# Patient Record
Sex: Male | Born: 1960 | Race: Black or African American | Hispanic: No | Marital: Single | Smoking: Never smoker
Health system: Southern US, Community
[De-identification: ages and names within clinical notes are randomized; demographics above are authoritative.]

## PROBLEM LIST (undated history)

## (undated) DIAGNOSIS — E08319 Diabetes mellitus due to underlying condition with unspecified diabetic retinopathy without macular edema: Secondary | ICD-10-CM

## (undated) DIAGNOSIS — D649 Anemia, unspecified: Secondary | ICD-10-CM

## (undated) DIAGNOSIS — R200 Anesthesia of skin: Secondary | ICD-10-CM

## (undated) DIAGNOSIS — I639 Cerebral infarction, unspecified: Secondary | ICD-10-CM

## (undated) DIAGNOSIS — I10 Essential (primary) hypertension: Secondary | ICD-10-CM

## (undated) DIAGNOSIS — I152 Hypertension secondary to endocrine disorders: Secondary | ICD-10-CM

## (undated) DIAGNOSIS — E785 Hyperlipidemia, unspecified: Secondary | ICD-10-CM

## (undated) DIAGNOSIS — G629 Polyneuropathy, unspecified: Secondary | ICD-10-CM

## (undated) DIAGNOSIS — H3500 Unspecified background retinopathy: Secondary | ICD-10-CM

## (undated) DIAGNOSIS — E1159 Type 2 diabetes mellitus with other circulatory complications: Secondary | ICD-10-CM

## (undated) DIAGNOSIS — N189 Chronic kidney disease, unspecified: Secondary | ICD-10-CM

## (undated) DIAGNOSIS — I739 Peripheral vascular disease, unspecified: Secondary | ICD-10-CM

## (undated) DIAGNOSIS — E119 Type 2 diabetes mellitus without complications: Secondary | ICD-10-CM

## (undated) HISTORY — DX: Essential (primary) hypertension: I10

## (undated) HISTORY — DX: Type 2 diabetes mellitus with other circulatory complications: E11.59

## (undated) HISTORY — DX: Anemia, unspecified: D64.9

## (undated) HISTORY — DX: Hypertension secondary to endocrine disorders: I15.2

## (undated) HISTORY — DX: Type 2 diabetes mellitus without complications: E11.9

## (undated) HISTORY — DX: Diabetes mellitus due to underlying condition with unspecified diabetic retinopathy without macular edema: E08.319

## (undated) HISTORY — DX: Polyneuropathy, unspecified: G62.9

## (undated) HISTORY — DX: Anesthesia of skin: R20.0

## (undated) HISTORY — DX: Hyperlipidemia, unspecified: E78.5

## (undated) HISTORY — DX: Peripheral vascular disease, unspecified: I73.9

## (undated) HISTORY — DX: Chronic kidney disease, unspecified: N18.9

---

## 2009-05-28 ENCOUNTER — Emergency Department (HOSPITAL_BASED_OUTPATIENT_CLINIC_OR_DEPARTMENT_OTHER): Admission: EM | Admit: 2009-05-28 | Discharge: 2009-05-28 | Payer: Self-pay | Admitting: Emergency Medicine

## 2009-05-28 ENCOUNTER — Ambulatory Visit: Payer: Self-pay | Admitting: Diagnostic Radiology

## 2009-06-08 ENCOUNTER — Emergency Department (HOSPITAL_BASED_OUTPATIENT_CLINIC_OR_DEPARTMENT_OTHER): Admission: EM | Admit: 2009-06-08 | Discharge: 2009-06-09 | Payer: Self-pay | Admitting: Emergency Medicine

## 2009-06-10 ENCOUNTER — Emergency Department (HOSPITAL_BASED_OUTPATIENT_CLINIC_OR_DEPARTMENT_OTHER): Admission: EM | Admit: 2009-06-10 | Discharge: 2009-06-10 | Payer: Self-pay | Admitting: Emergency Medicine

## 2010-08-12 LAB — DIFFERENTIAL
Eosinophils Absolute: 0.1 10*3/uL (ref 0.0–0.7)
Lymphs Abs: 1.6 10*3/uL (ref 0.7–4.0)
Neutrophils Relative %: 53 % (ref 43–77)

## 2010-08-12 LAB — COMPREHENSIVE METABOLIC PANEL
ALT: 21 U/L (ref 0–53)
Albumin: 4.4 g/dL (ref 3.5–5.2)
Alkaline Phosphatase: 108 U/L (ref 39–117)
CO2: 21 mEq/L (ref 19–32)
Calcium: 9.7 mg/dL (ref 8.4–10.5)
GFR calc Af Amer: >60 mL/min (ref >60)
GFR calc non Af Amer: >60 mL/min (ref >60)
Glucose, Bld: 568 mg/dL (ref 70–99)
Sodium: 135 mEq/L (ref 135–145)

## 2010-08-12 LAB — POCT CARDIAC MARKERS: CKMB, poc: 1.3 ng/mL (ref 1.0–8.0)

## 2010-08-12 LAB — URINALYSIS, ROUTINE W REFLEX MICROSCOPIC
Glucose, UA: >1000 mg/dL — AB
Hgb urine dipstick: NEGATIVE
Ketones, ur: 15 mg/dL — AB
Leukocytes, UA: NEGATIVE
Protein, ur: NEGATIVE mg/dL
Urobilinogen, UA: 0.2 mg/dL (ref 0.0–1.0)
pH: 6 (ref 5.0–8.0)

## 2010-08-12 LAB — POCT I-STAT 3, VENOUS BLOOD GAS (G3P V)
Acid-base deficit: 2 mmol/L (ref 0.0–2.0)
Bicarbonate: 22.7 mEq/L (ref 20.0–24.0)
O2 Saturation: 94 %
TCO2: 24 mmol/L (ref 0–100)
pCO2, Ven: 39.5 mmHg — ABNORMAL LOW (ref 45.0–50.0)
pO2, Ven: 74 mmHg — ABNORMAL HIGH (ref 30.0–45.0)

## 2010-08-12 LAB — GLUCOSE, CAPILLARY
Glucose-Capillary: 335 mg/dL — ABNORMAL HIGH (ref 70–99)
Glucose-Capillary: 530 mg/dL (ref 70–99)

## 2010-08-12 LAB — CBC
Platelets: 127 10*3/uL — ABNORMAL LOW (ref 150–400)
RBC: 5.68 MIL/uL (ref 4.22–5.81)

## 2010-08-12 LAB — KETONES, QUALITATIVE: Acetone, Bld: POSITIVE — AB

## 2010-08-12 LAB — URINE MICROSCOPIC-ADD ON

## 2013-07-27 ENCOUNTER — Ambulatory Visit: Payer: Self-pay | Admitting: Podiatry

## 2013-08-17 ENCOUNTER — Ambulatory Visit: Payer: Self-pay | Admitting: Podiatry

## 2013-08-31 ENCOUNTER — Ambulatory Visit (INDEPENDENT_AMBULATORY_CARE_PROVIDER_SITE_OTHER): Payer: BC Managed Care – PPO

## 2013-08-31 ENCOUNTER — Ambulatory Visit (INDEPENDENT_AMBULATORY_CARE_PROVIDER_SITE_OTHER): Payer: BC Managed Care – PPO | Admitting: Podiatry

## 2013-08-31 ENCOUNTER — Encounter: Payer: Self-pay | Admitting: Podiatry

## 2013-08-31 VITALS — BP 120/82 | HR 90 | Resp 16 | Ht 68.5 in | Wt 179.0 lb

## 2013-08-31 DIAGNOSIS — M722 Plantar fascial fibromatosis: Secondary | ICD-10-CM

## 2013-08-31 DIAGNOSIS — Q6650 Congenital pes planus, unspecified foot: Secondary | ICD-10-CM

## 2013-08-31 DIAGNOSIS — M79671 Pain in right foot: Secondary | ICD-10-CM

## 2013-08-31 DIAGNOSIS — M79672 Pain in left foot: Principal | ICD-10-CM

## 2013-08-31 DIAGNOSIS — M79609 Pain in unspecified limb: Secondary | ICD-10-CM

## 2013-08-31 DIAGNOSIS — E1049 Type 1 diabetes mellitus with other diabetic neurological complication: Secondary | ICD-10-CM

## 2013-08-31 MED ORDER — GABAPENTIN 300 MG PO CAPS
ORAL_CAPSULE | ORAL | Status: DC
Start: 2013-08-31 — End: 2014-12-23

## 2013-08-31 NOTE — Progress Notes (Signed)
   Subjective:    Patient ID: Douglas Briggs, male    DOB: 03/10/1961, 53 y.o.   MRN: [id number]  HPI Comments: Been having trouble with both my feet at night , they burn and hurt real badly. i am diabetic for the last 3 years. Blood sugar this morning was 130   Foot Pain  Diabetes      Review of Systems  Endocrine:       Diabetes   All other systems reviewed and are negative.       Objective:   Physical Exam: I have reviewed his past medical history medications allergies surgeries social history and review of systems. Pulses are strongly palpable but neurologic sensorium is decreased per since once the monofilament. Deep tendon reflexes are not elicitable. Muscle strength is 5 over 5 dorsiflexors plantar flexors inverters everters all intrinsic musculature is intact. Orthopedic evaluation demonstrates all joints distal to the ankle a full range of motion without crepitus. Flexible pes planus is also noted. Mild plantar fasciitis. Radiographic evaluation demonstrates flexible pes planus no other major osseous abnormalities.        Assessment & Plan:  Assessment: Diabetic peripheral neuropathy and pes planus. Mild plantar fasciitis.  Plan: Discussed etiology pathology conservative versus surgical therapies. At this point we are going to increase his gabapentin 300 mg in the morning. 300 mg at lunch. And 600 mg at night. He was scanned for a pair orthotics today and I will followup with him in one month to reevaluate his medication.

## 2013-09-14 ENCOUNTER — Telehealth: Payer: Self-pay | Admitting: *Deleted

## 2013-09-14 ENCOUNTER — Ambulatory Visit (INDEPENDENT_AMBULATORY_CARE_PROVIDER_SITE_OTHER): Payer: BC Managed Care – PPO | Admitting: Podiatry

## 2013-09-14 ENCOUNTER — Encounter: Payer: Self-pay | Admitting: Podiatry

## 2013-09-14 VITALS — BP 120/80 | HR 80 | Resp 16

## 2013-09-14 DIAGNOSIS — M722 Plantar fascial fibromatosis: Secondary | ICD-10-CM

## 2013-09-14 NOTE — Progress Notes (Signed)
Pt presents for orthotic pick up written and verbal instructions given . He also states he has a problem with an ingrown nail we will followup with him in 3-4 weeks for reevaluation I 1 the ingrown nail and up to the orthotics.

## 2013-09-14 NOTE — Patient Instructions (Signed)

## 2013-09-14 NOTE — Telephone Encounter (Signed)
I was told to give you all a call.  I returned his call.  He said that he's headed this way now.  I asked if he was calling about his orthotics.  He stated yes.  I asked if he was told to schedule an appointment.  He said he has an appointment at 4pm to see the nurse and has a follow-up appointment with Dr. Milinda Pointer on 09/28/13.

## 2013-09-28 ENCOUNTER — Ambulatory Visit (INDEPENDENT_AMBULATORY_CARE_PROVIDER_SITE_OTHER): Payer: BC Managed Care – PPO | Admitting: Podiatry

## 2013-09-28 ENCOUNTER — Encounter: Payer: Self-pay | Admitting: Podiatry

## 2013-09-28 VITALS — BP 166/96 | HR 88 | Resp 17 | Ht 68.5 in | Wt 178.0 lb

## 2013-09-28 DIAGNOSIS — M722 Plantar fascial fibromatosis: Secondary | ICD-10-CM

## 2013-09-28 DIAGNOSIS — M79609 Pain in unspecified limb: Secondary | ICD-10-CM

## 2013-09-28 DIAGNOSIS — E1049 Type 1 diabetes mellitus with other diabetic neurological complication: Secondary | ICD-10-CM

## 2013-09-28 DIAGNOSIS — B351 Tinea unguium: Secondary | ICD-10-CM

## 2013-09-28 DIAGNOSIS — Q6650 Congenital pes planus, unspecified foot: Secondary | ICD-10-CM

## 2013-09-28 NOTE — Progress Notes (Signed)
   Subjective:    Patient ID: Douglas Briggs, male    DOB: 1961-04-18, 53 y.o.   MRN: [id number]  HPI Comments: Pt states his toes feel tight and his feet hurt off and on.  Pt complains of pain in the left 5th lateral toe for 2 months.     Review of Systems     Objective:   Physical Exam: I have reviewed his past history medications and allergies. He has much decrease in symptoms of flatfoot deformity with the use of his orthotics at this point. However his nails are thick yellow dystrophic onychomycotic and painful palpation.          Assessment & Plan:  Assessment: Pes planus with plantar fasciitis resolving. Pain in limb secondary to onychomycosis a sharp incurvated nail margins with 5 bilateral.  Plan: Debridement of nails 1 through 5 bilateral covered service secondary to pain continue the use of the orthotics.

## 2014-12-23 ENCOUNTER — Other Ambulatory Visit: Payer: Self-pay | Admitting: Podiatry

## 2015-12-18 ENCOUNTER — Other Ambulatory Visit: Payer: Self-pay | Admitting: Podiatry

## 2015-12-19 NOTE — Telephone Encounter (Signed)
Pt needs an appt prior to future refills. 

## 2016-01-15 ENCOUNTER — Other Ambulatory Visit: Payer: Self-pay | Admitting: Podiatry

## 2016-01-15 NOTE — Telephone Encounter (Signed)
Pt needs an appt prior to future refills. 

## 2016-11-12 DIAGNOSIS — IMO0002 Reserved for concepts with insufficient information to code with codable children: Secondary | ICD-10-CM

## 2016-11-12 DIAGNOSIS — E1165 Type 2 diabetes mellitus with hyperglycemia: Secondary | ICD-10-CM

## 2016-11-12 HISTORY — DX: Reserved for concepts with insufficient information to code with codable children: IMO0002

## 2016-11-12 HISTORY — DX: Type 2 diabetes mellitus with diabetic polyneuropathy: E11.65

## 2016-11-14 DIAGNOSIS — E291 Testicular hypofunction: Secondary | ICD-10-CM

## 2016-11-14 DIAGNOSIS — G2581 Restless legs syndrome: Secondary | ICD-10-CM

## 2016-11-14 HISTORY — DX: Restless legs syndrome: G25.81

## 2016-11-14 HISTORY — DX: Testicular hypofunction: E29.1

## 2017-03-12 DIAGNOSIS — M2042 Other hammer toe(s) (acquired), left foot: Secondary | ICD-10-CM

## 2017-03-12 DIAGNOSIS — B353 Tinea pedis: Secondary | ICD-10-CM | POA: Insufficient documentation

## 2017-03-12 HISTORY — DX: Other hammer toe(s) (acquired), left foot: M20.42

## 2017-03-12 HISTORY — DX: Tinea pedis: B35.3

## 2017-08-29 DIAGNOSIS — Z794 Long term (current) use of insulin: Secondary | ICD-10-CM | POA: Insufficient documentation

## 2017-08-29 DIAGNOSIS — E1140 Type 2 diabetes mellitus with diabetic neuropathy, unspecified: Secondary | ICD-10-CM | POA: Insufficient documentation

## 2017-08-29 DIAGNOSIS — L089 Local infection of the skin and subcutaneous tissue, unspecified: Secondary | ICD-10-CM | POA: Diagnosis not present

## 2017-08-29 DIAGNOSIS — R21 Rash and other nonspecific skin eruption: Secondary | ICD-10-CM | POA: Diagnosis present

## 2017-08-29 DIAGNOSIS — Z23 Encounter for immunization: Secondary | ICD-10-CM | POA: Diagnosis not present

## 2017-08-30 ENCOUNTER — Emergency Department (HOSPITAL_BASED_OUTPATIENT_CLINIC_OR_DEPARTMENT_OTHER)
Admission: EM | Admit: 2017-08-30 | Discharge: 2017-08-30 | Disposition: A | Discharge status: Home / Self Care | Payer: PRIVATE HEALTH INSURANCE | Attending: Emergency Medicine | Admitting: Emergency Medicine

## 2017-08-30 ENCOUNTER — Other Ambulatory Visit: Payer: Self-pay

## 2017-08-30 ENCOUNTER — Encounter (HOSPITAL_BASED_OUTPATIENT_CLINIC_OR_DEPARTMENT_OTHER): Payer: Self-pay | Admitting: Emergency Medicine

## 2017-08-30 DIAGNOSIS — L089 Local infection of the skin and subcutaneous tissue, unspecified: Secondary | ICD-10-CM

## 2017-08-30 MED ORDER — IBUPROFEN 800 MG PO TABS: 800.0000 mg | ORAL_TABLET | Freq: Three times a day (TID) | ORAL | 0 refills | Status: DC

## 2017-08-30 MED ORDER — IBUPROFEN 800 MG PO TABS
800.0000 mg | ORAL_TABLET | Freq: Once | ORAL | Status: AC
Start: 2017-08-30 — End: 2017-08-30
  Administered 2017-08-30: 800 mg via ORAL
  Filled 2017-08-30: qty 1

## 2017-08-30 MED ORDER — DOXYCYCLINE HYCLATE 100 MG PO CAPS: 100.0000 mg | ORAL_CAPSULE | Freq: Two times a day (BID) | ORAL | 0 refills | Status: DC

## 2017-08-30 MED ORDER — DOXYCYCLINE HYCLATE 100 MG PO TABS
100.0000 mg | ORAL_TABLET | Freq: Once | ORAL | Status: AC
  Administered 2017-08-30: 100 mg via ORAL
  Filled 2017-08-30: qty 1

## 2017-08-30 MED ORDER — BACITRACIN ZINC 500 UNIT/GM EX OINT
TOPICAL_OINTMENT | Freq: Two times a day (BID) | CUTANEOUS | Status: DC
  Administered 2017-08-30: 03:00:00 via TOPICAL
  Filled 2017-08-30: qty 28.35

## 2017-08-30 MED ORDER — TETANUS-DIPHTH-ACELL PERTUSSIS 5-2.5-18.5 LF-MCG/0.5 IM SUSP
0.5000 mL | Freq: Once | INTRAMUSCULAR | Status: AC
  Administered 2017-08-30: 0.5 mL via INTRAMUSCULAR
  Filled 2017-08-30: qty 0.5

## 2017-08-30 NOTE — ED Triage Notes (Signed)
Patient states that he has had "sore" to the back of his head x 2 days - noted abscess to the back of his head  - also reports that he has a rash to his groin x 2 days

## 2017-08-30 NOTE — ED Provider Notes (Signed)
[facility] EMERGENCY DEPARTMENT Provider Note   CSN: [id number] Arrival date & time: 08/29/17  2352     History   Chief Complaint Chief Complaint  Patient presents with  . Abscess  . Rash    HPI Douglas Briggs is a 57 y.o. male.  The history is provided by the patient.  Abscess  Location:  Head/neck Head/neck abscess location:  Scalp Abscess quality: draining, itching and painful   Abscess quality: no redness   Red streaking: no   Duration:  1 week Progression:  Unchanged Pain details:    Quality:  Dull   Severity:  Moderate   Duration:  1 week   Timing:  Constant   Progression:  Unchanged Chronicity:  New Relieved by:  Nothing Worsened by:  Nothing Ineffective treatments:  None tried Associated symptoms: no anorexia, no fatigue, no fever, no headaches, no nausea and no vomiting   Risk factors: no prior abscess   elevated lesion to the occiput since getting head shaved a week ago.  No f/c/r.  No neck stiffness, no redness or streaking no LAN.    Past Medical History:  Diagnosis Date  . Diabetes mellitus without complication ([place])   . Neuropathy     There are no active problems to display for this patient.   History reviewed. No pertinent surgical history.      Home Medications    Prior to Admission medications   Medication Sig Start Date End Date Taking? Authorizing Provider  doxycycline (VIBRAMYCIN) 100 MG capsule Take 1 capsule (100 mg total) by mouth 2 (two) times daily. One po bid x 7 days 08/30/17   Neng Albee, MD  gabapentin (NEURONTIN) 300 MG capsule Take 300 mg by mouth at bedtime.    [provider]  gabapentin (NEURONTIN) 300 MG capsule TAKE 1 CAPSULES IN THE MORNING AND TAKE 1 CAPSULE AT [facility] 2 CAPSULE AT BEDTIME 01/15/16   Hyatt, Max T, DPM  ibuprofen (ADVIL,MOTRIN) 800 MG tablet Take 1 tablet (800 mg total) by mouth 3 (three) times daily. 08/30/17   Arbor Leer, MD  Insulin Glargine (LANTUS) 100 UNIT/ML Solostar Pen  Inject into the skin 2 (two) times daily.    [provider]  metFORMIN (GLUCOPHAGE) 1000 MG tablet Take 1,000 mg by mouth 2 (two) times daily with a meal.    [provider]    Family History History reviewed. No pertinent family history.  Social History Social History   Tobacco Use  . Smoking status: Never Smoker  . Smokeless tobacco: Never Used  Substance Use Topics  . Alcohol use: No  . Drug use: No     Allergies   Patient has no known allergies.   Review of Systems Review of Systems  Constitutional: Negative for fatigue and fever.  Gastrointestinal: Negative for anorexia, nausea and vomiting.  Musculoskeletal: Negative for neck pain and neck stiffness.  Skin: Negative for rash.  Neurological: Negative for headaches.  Hematological: Negative for adenopathy. Does not bruise/bleed easily.  All other systems reviewed and are negative.    Physical Exam Updated Vital Signs BP (!) 169/101 (BP Location: Right Arm)   Pulse 97   Temp 98.4 F (36.9 C) (Oral)   Resp 18   Ht 5' 6.5" (1.689 m)   Wt 79.8 kg (176 lb)   SpO2 99%   BMI 27.98 kg/m   Physical Exam  Constitutional: He is oriented to person, place, and time. He appears well-developed and well-nourished. No distress.  HENT:  Head: Normocephalic and atraumatic.  Mouth/Throat: No oropharyngeal exudate.  Eyes: Pupils are equal, round, and reactive to light. Conjunctivae are normal.  Neck: Normal range of motion. Neck supple.  Cardiovascular: Normal rate, regular rhythm, normal heart sounds and intact distal pulses.  Pulmonary/Chest: Effort normal and breath sounds normal. No stridor. He has no wheezes. He has no rales.  Abdominal: Soft. Bowel sounds are normal. He exhibits no mass. There is no tenderness. There is no rebound and no guarding.  Musculoskeletal: Normal range of motion.  Neurological: He is alert and oriented to person, place, and time. He displays normal reflexes.  Skin: Skin is  warm and dry. Capillary refill takes less than 2 seconds.        ED Treatments / Results  Labs (all labs ordered are listed, but only abnormal results are displayed) Labs Reviewed - No data to display  EKG None  Radiology No results found.  Procedures Procedures (including critical care time)  Medications Ordered in ED Medications  bacitracin ointment ( Topical Given 08/30/17 0303)  doxycycline (VIBRA-TABS) tablet 100 mg (100 mg Oral Given 08/30/17 0253)  ibuprofen (ADVIL,MOTRIN) tablet 800 mg (800 mg Oral Given 08/30/17 0255)  Tdap (BOOSTRIX) injection 0.5 mL (0.5 mLs Intramuscular Given 08/30/17 0254)       Final Clinical Impressions(s) / ED Diagnoses   Final diagnoses:  Skin infection   Will treat for skin infection.  No head shaving.  Wound care instructions given.     Return for weakness, numbness, changes in vision or speech, fevers >100.4 unrelieved by medication, shortness of breath, intractable vomiting, or diarrhea, abdominal pain, Inability to tolerate liquids or food, cough, altered mental status or any concerns. No signs of systemic illness or infection. The patient is nontoxic-appearing on exam and vital signs are within normal limits.   I have reviewed the triage vital signs and the nursing notes. Pertinent labs &imaging results that were available during my care of the patient were reviewed by me and considered in my medical decision making (see chart for details).  After history, exam, and medical workup I feel the patient has been appropriately medically screened and is safe for discharge home. Pertinent diagnoses were discussed with the patient. Patient was given return precautions.   ED Discharge Orders        Ordered    doxycycline (VIBRAMYCIN) 100 MG capsule  2 times daily     08/30/17 0240    ibuprofen (ADVIL,MOTRIN) 800 MG tablet  3 times daily     08/30/17 0240       Liala Codispoti, MD 08/30/17 [phone]

## 2017-12-04 ENCOUNTER — Emergency Department (HOSPITAL_BASED_OUTPATIENT_CLINIC_OR_DEPARTMENT_OTHER)
Admission: EM | Admit: 2017-12-04 | Discharge: 2017-12-04 | Disposition: A | Discharge status: Home / Self Care | Payer: Managed Care, Other (non HMO) | Attending: Emergency Medicine | Admitting: Emergency Medicine

## 2017-12-04 ENCOUNTER — Encounter (HOSPITAL_BASED_OUTPATIENT_CLINIC_OR_DEPARTMENT_OTHER): Payer: Self-pay

## 2017-12-04 DIAGNOSIS — E1140 Type 2 diabetes mellitus with diabetic neuropathy, unspecified: Secondary | ICD-10-CM | POA: Insufficient documentation

## 2017-12-04 DIAGNOSIS — Z79899 Other long term (current) drug therapy: Secondary | ICD-10-CM | POA: Diagnosis not present

## 2017-12-04 DIAGNOSIS — R21 Rash and other nonspecific skin eruption: Secondary | ICD-10-CM

## 2017-12-04 DIAGNOSIS — L0231 Cutaneous abscess of buttock: Secondary | ICD-10-CM | POA: Insufficient documentation

## 2017-12-04 DIAGNOSIS — Z794 Long term (current) use of insulin: Secondary | ICD-10-CM | POA: Insufficient documentation

## 2017-12-04 LAB — CBG MONITORING, ED: GLUCOSE-CAPILLARY: 268 mg/dL — AB (ref 70–99)

## 2017-12-04 MED ORDER — CEPHALEXIN 500 MG PO CAPS: 500.0000 mg | ORAL_CAPSULE | Freq: Three times a day (TID) | ORAL | 0 refills | Status: DC

## 2017-12-04 MED ORDER — TRIAMCINOLONE ACETONIDE 0.1 % EX CREA: 1.0000 "application " | TOPICAL_CREAM | Freq: Two times a day (BID) | CUTANEOUS | 0 refills | Status: DC

## 2017-12-04 MED ORDER — HYDROXYZINE HCL 25 MG PO TABS: 25.0000 mg | ORAL_TABLET | Freq: Four times a day (QID) | ORAL | 0 refills | Status: DC

## 2017-12-04 MED FILL — CEPHALEXIN 500 MG CAPSULE: 500 | 7 days supply | Qty: 21 | Fill #0

## 2017-12-04 MED FILL — TRIAMCINOLONE 0.1% CREAM: 0.1 | 15 days supply | Qty: 30 | Fill #0

## 2017-12-04 MED FILL — hydrOXYzine HCL 25 MG TABS: 25 | 3 days supply | Qty: 12 | Fill #0

## 2017-12-04 NOTE — ED Provider Notes (Signed)
[facility] EMERGENCY DEPARTMENT Provider Note   CSN: [id number] Arrival date & time: 12/04/17  1030     History   Chief Complaint Chief Complaint  Patient presents with  . Abscess    HPI Douglas Briggs is a 57 y.o. male.  HPI  Douglas Briggs is a 57 y.o. male presents to ED with rash to the lower abdomen and lower back. States rash is dry, daily, itchy.  Started several days ago.  States tried putting baby oil on it, it did not help. States last night reports noticing pain to left buttock. States since then increased pain, redness, swelling, draining from a bump on left buttock.  No other complaints.  No fever or chills.  No URI symptoms.  No history of similar rash in the past.   Past Medical History:  Diagnosis Date  . Diabetes mellitus without complication ([place])   . Neuropathy     There are no active problems to display for this patient.   History reviewed. No pertinent surgical history.      Home Medications    Prior to Admission medications   Medication Sig Start Date End Date Taking? Authorizing Provider  gabapentin (NEURONTIN) 300 MG capsule Take 300 mg by mouth at bedtime.    [provider]  gabapentin (NEURONTIN) 300 MG capsule TAKE 1 CAPSULES IN THE MORNING AND TAKE 1 CAPSULE AT [facility] 2 CAPSULE AT BEDTIME 01/15/16   Hyatt, Max T, DPM  ibuprofen (ADVIL,MOTRIN) 800 MG tablet Take 1 tablet (800 mg total) by mouth 3 (three) times daily. 08/30/17   Palumbo, April, MD  Insulin Glargine (LANTUS) 100 UNIT/ML Solostar Pen Inject into the skin 2 (two) times daily.    [provider]  metFORMIN (GLUCOPHAGE) 1000 MG tablet Take 1,000 mg by mouth 2 (two) times daily with a meal.    [provider]    Family History No family history on file.  Social History Social History   Tobacco Use  . Smoking status: Never Smoker  . Smokeless tobacco: Never Used  Substance Use Topics  . Alcohol use: No  . Drug use: No     Allergies     Patient has no known allergies.   Review of Systems Review of Systems  Constitutional: Negative for chills and fever.  Respiratory: Negative for cough, chest tightness and shortness of breath.   Cardiovascular: Negative for chest pain, palpitations and leg swelling.  Gastrointestinal: Negative for abdominal distention, abdominal pain, diarrhea, nausea and vomiting.  Genitourinary: Negative for dysuria, frequency, hematuria and urgency.  Musculoskeletal: Negative for arthralgias, myalgias, neck pain and neck stiffness.  Skin: Positive for rash and wound.  Allergic/Immunologic: Negative for immunocompromised state.  Neurological: Negative for dizziness, weakness, light-headedness, numbness and headaches.  All other systems reviewed and are negative.    Physical Exam Updated Vital Signs BP 133/81 (BP Location: Right Arm)   Pulse 71   Temp 98.2 F (36.8 C) (Oral)   Resp 18   SpO2 97%   Physical Exam  Constitutional: He appears well-developed and well-nourished. No distress.  HENT:  Head: Normocephalic and atraumatic.  Eyes: Conjunctivae are normal.  Neck: Neck supple.  Cardiovascular: Normal rate, regular rhythm and normal heart sounds.  Pulmonary/Chest: Effort normal. No respiratory distress. He has no wheezes. He has no rales.  Abdominal: Soft. Bowel sounds are normal. He exhibits no distension. There is no tenderness. There is no rebound.  Musculoskeletal: He exhibits no edema.  Neurological: He is alert.  Skin:  Skin is warm and dry.  Approximately 2 x 2 centimeter open wound to the left buttock, no drainage at this time, mild surrounding induration with no erythema.  No fluctuance.  Dry scaly, plaques scattered to the lower abdomen, bilateral axilla, bilateral groin, lower back.  Pruritic.  Nursing note and vitals reviewed.    ED Treatments / Results  Labs (all labs ordered are listed, but only abnormal results are displayed) Labs Reviewed  CBG MONITORING, ED -  Abnormal; Notable for the following components:      Result Value   Glucose-Capillary 268 (*)    All other components within normal limits    EKG None  Radiology No results found.  Procedures Procedures (including critical care time)  Medications Ordered in ED Medications - No data to display   Initial Impression / Assessment and Plan / ED Course  I have reviewed the triage vital signs and the nursing notes.  Pertinent labs & imaging results that were available during my care of the patient were reviewed by me and considered in my medical decision making (see chart for details).     Patient with a rash to the torso, axilla, groin.  Most consistent with a possibly tinea versicolor versus pityriasis rosea.  Patient reassured.  Will give Kenalog cream topically for itch control, as well as Vistaril.  We will also treat his abscess which is already open and draining with Keflex and warm compresses.  Close outpatient follow-up recommended.  He is afebrile, nontoxic-appearing.  Normal vital signs.  I do not think he needs further evaluation emergency department.  Blood sugar slightly elevated, patient states he had soda this morning.  Advised to keep a close eye on his blood sugars.  Vitals:   12/04/17 1039  BP: 133/81  Pulse: 71  Resp: 18  Temp: 98.2 F (36.8 C)  TempSrc: Oral  SpO2: 97%     Final Clinical Impressions(s) / ED Diagnoses   Final diagnoses:  Abscess of left buttock  Rash and nonspecific skin eruption    ED Discharge Orders        Ordered    cephALEXin (KEFLEX) 500 MG capsule  3 times daily     12/04/17 1224    triamcinolone cream (KENALOG) 0.1 %  2 times daily     12/04/17 1224    hydrOXYzine (ATARAX/VISTARIL) 25 MG tablet  Every 6 hours     12/04/17 1224       Jeannett Senior, PA-C 12/04/17 1228    Quintella Reichert, MD 12/05/17 [phone]

## 2017-12-04 NOTE — Discharge Instructions (Signed)
Warm compresses or soaks to your abscess.  Take antibiotic as prescribed until all gone.  Your rash is most likely tinea versicolor or pityriasis rosea.  Most of the time will go away on its own.  You can try some Kenalog cream to the rash and moisturizing cream after baths.  Take Vistaril as needed for itching.  Follow-up with family doctor.

## 2017-12-04 NOTE — ED Triage Notes (Signed)
Pt c/o abscess to lt buttocks with a rash all over since last night

## 2018-02-18 ENCOUNTER — Emergency Department (HOSPITAL_BASED_OUTPATIENT_CLINIC_OR_DEPARTMENT_OTHER)
Admission: EM | Admit: 2018-02-18 | Discharge: 2018-02-18 | Disposition: A | Discharge status: Home / Self Care | Payer: Managed Care, Other (non HMO) | Attending: Emergency Medicine | Admitting: Emergency Medicine

## 2018-02-18 ENCOUNTER — Other Ambulatory Visit: Payer: Self-pay

## 2018-02-18 ENCOUNTER — Encounter (HOSPITAL_BASED_OUTPATIENT_CLINIC_OR_DEPARTMENT_OTHER): Payer: Self-pay

## 2018-02-18 DIAGNOSIS — G44209 Tension-type headache, unspecified, not intractable: Secondary | ICD-10-CM

## 2018-02-18 DIAGNOSIS — Z794 Long term (current) use of insulin: Secondary | ICD-10-CM | POA: Insufficient documentation

## 2018-02-18 DIAGNOSIS — G44219 Episodic tension-type headache, not intractable: Secondary | ICD-10-CM | POA: Insufficient documentation

## 2018-02-18 DIAGNOSIS — R51 Headache: Secondary | ICD-10-CM | POA: Diagnosis present

## 2018-02-18 DIAGNOSIS — E1140 Type 2 diabetes mellitus with diabetic neuropathy, unspecified: Secondary | ICD-10-CM | POA: Diagnosis not present

## 2018-02-18 NOTE — Discharge Instructions (Signed)
Your physical exam looks good today. If you get another similar headache to the ones you had yesterday and today, you can use Tylenol or Ibuprofen.  Return to the emergency department if you have a headache plus any of the following: fever; neck pain/inability to move neck; numbness or tingling in arms, legs or face; difficulty maintaining balance; slurred speech; dizziness.  Thank you for allowing me to take care of you today. Please take time to rest between your many jobs. Also, be sure to get that ear mole checked.

## 2018-02-18 NOTE — ED Provider Notes (Signed)
[facility] EMERGENCY DEPARTMENT Provider Note  CSN: [id number] Arrival date & time: 02/18/18  1212  History   Chief Complaint Chief Complaint  Patient presents with  . Headache    HPI Douglas Briggs is a 57 y.o. male with a medical history of Type 2 DM who presented to the ED for headache x2 episodes. He describes occipital tightness that began last night 02/17/18 while he was at work, but states that it resolved after a couple of hours without any pharm intervention. Patient rates headache as 2/10. No specific worsening factors, but headache resolved on its own.There were no associated symptoms such as vision changes, photo/phono-phobia, N/V, dizziness/lightheadedness, paresthesias, weakness, palpitations, chest pain or SOB. Patient states he had another headache today prior to coming to the ED, but states it has resolved. Denies any head trauma, falls or injuries.  Past Medical History:  Diagnosis Date  . Diabetes mellitus without complication ([place])   . Neuropathy     There are no active problems to display for this patient.   History reviewed. No pertinent surgical history.    Home Medications    Prior to Admission medications   Medication Sig Start Date End Date Taking? Authorizing Provider  cephALEXin (KEFLEX) 500 MG capsule Take 1 capsule (500 mg total) by mouth 3 (three) times daily. 12/04/17   Kirichenko, Tatyana, PA-C  gabapentin (NEURONTIN) 300 MG capsule Take 300 mg by mouth at bedtime.    [provider]  gabapentin (NEURONTIN) 300 MG capsule TAKE 1 CAPSULES IN THE MORNING AND TAKE 1 CAPSULE AT [facility] 2 CAPSULE AT BEDTIME 01/15/16   Hyatt, Max T, DPM  hydrOXYzine (ATARAX/VISTARIL) 25 MG tablet Take 1 tablet (25 mg total) by mouth every 6 (six) hours. 12/04/17   Kirichenko, Tatyana, PA-C  ibuprofen (ADVIL,MOTRIN) 800 MG tablet Take 1 tablet (800 mg total) by mouth 3 (three) times daily. 08/30/17   Palumbo, April, MD  Insulin Glargine (LANTUS) 100 UNIT/ML  Solostar Pen Inject into the skin 2 (two) times daily.    [provider]  metFORMIN (GLUCOPHAGE) 1000 MG tablet Take 1,000 mg by mouth 2 (two) times daily with a meal.    [provider]  triamcinolone cream (KENALOG) 0.1 % Apply 1 application topically 2 (two) times daily. 12/04/17   Jeannett Senior, PA-C    Family History No family history on file.  Social History Social History   Tobacco Use  . Smoking status: Never Smoker  . Smokeless tobacco: Never Used  Substance Use Topics  . Alcohol use: No  . Drug use: No     Allergies   Patient has no known allergies.   Review of Systems Review of Systems  Constitutional: Negative for fever and malaise/fatigue.  Respiratory: Negative for chest tightness and shortness of breath.   Cardiovascular: Negative for chest pain, palpitations, orthopnea and near-syncope.  Gastrointestinal: Negative for nausea and vomiting.  Neurological: Positive for headaches. Negative for weakness and numbness.     Physical Exam Updated Vital Signs BP (!) 157/87   Pulse 86   Temp 98.7 F (37.1 C) (Oral)   Resp 12   Ht 5\' 8"  (1.727 m)   Wt 79.4 kg   SpO2 98%   BMI 26.61 kg/m   Physical Exam  Constitutional: He is oriented to person, place, and time. He appears well-developed and well-nourished.  HENT:  Head: Normocephalic and atraumatic.  Eyes: Pupils are equal, round, and reactive to light. Conjunctivae, EOM and lids are normal.  Neck: Normal  range of motion and full passive range of motion without pain. Neck supple. Normal carotid pulses present. Carotid bruit is not present.  Cardiovascular: Normal rate, regular rhythm, intact distal pulses and normal pulses.  No murmur heard. Pulmonary/Chest: Effort normal and breath sounds normal.  Musculoskeletal: Normal range of motion.  Neurological: He is alert and oriented to person, place, and time. He has normal strength. No cranial nerve deficit or sensory deficit. He  exhibits normal muscle tone. Coordination and gait normal.  Reflex Scores:      Tricep reflexes are 2+ on the right side and 2+ on the left side.      Bicep reflexes are 2+ on the right side and 2+ on the left side.      Brachioradialis reflexes are 2+ on the right side and 2+ on the left side.      Patellar reflexes are 2+ on the right side and 2+ on the left side.      Achilles reflexes are 2+ on the right side and 2+ on the left side. Skin: Skin is warm and intact. Capillary refill takes less than 2 seconds.  Psychiatric: He has a normal mood and affect. His speech is normal and behavior is normal. Thought content normal. Cognition and memory are normal.  Nursing note and vitals reviewed.  ED Treatments / Results  Labs (all labs ordered are listed, but only abnormal results are displayed) Labs Reviewed - No data to display  EKG None  Radiology No results found.  Procedures Procedures (including critical care time)  Medications Ordered in ED Medications - No data to display   Initial Impression / Assessment and Plan / ED Course  Triage vital signs and the nursing notes have been reviewed.  Pertinent labs & imaging results that were available during care of the patient were reviewed and considered in medical decision making (see chart for details).   Patient presents afebrile and well appearing with complaints of a headache. Patient currently does not have a headache and states that it resolved prior to his arrival. History given is consistent with a tension headache. There are no neuro abnormalities on physical exam or reports of recent head trauma/injury or LOC that raise concern for an intracranial process that requires head imaging.  Final Clinical Impressions(s) / ED Diagnoses  1. Tension Headache. Education provided on OTC and supportive treatment for relief. Advised to follow-up with PCP if headaches get more frequent or severe.  Dispo: Home. After thorough clinical  evaluation, this patient is determined to be medically stable and can be safely discharged with the previously mentioned treatment and/or outpatient follow-up/referral(s). At this time, there are no other apparent medical conditions that require further screening, evaluation or treatment.   Final diagnoses:  Acute non intractable tension-type headache    ED Discharge Orders    None        Junita Push 02/18/18 1618    Jola Schmidt, MD 02/21/18 [phone]

## 2018-02-18 NOTE — ED Triage Notes (Signed)
/  o HA that started to back of head and neck pain-started last night-denies injury-NAD-steady gait

## 2018-02-18 NOTE — ED Notes (Signed)
Pt/family verbalized understanding of discharge instructions.   

## 2019-10-28 ENCOUNTER — Other Ambulatory Visit: Payer: Self-pay

## 2019-10-28 ENCOUNTER — Emergency Department (HOSPITAL_BASED_OUTPATIENT_CLINIC_OR_DEPARTMENT_OTHER): Payer: No Typology Code available for payment source

## 2019-10-28 ENCOUNTER — Encounter (HOSPITAL_BASED_OUTPATIENT_CLINIC_OR_DEPARTMENT_OTHER): Payer: Self-pay

## 2019-10-28 ENCOUNTER — Emergency Department (HOSPITAL_BASED_OUTPATIENT_CLINIC_OR_DEPARTMENT_OTHER)
Admission: EM | Admit: 2019-10-28 | Discharge: 2019-10-28 | Discharge status: Against Medical Advice | Payer: No Typology Code available for payment source | Source: Home / Self Care

## 2019-10-28 ENCOUNTER — Encounter (HOSPITAL_BASED_OUTPATIENT_CLINIC_OR_DEPARTMENT_OTHER): Payer: Self-pay | Admitting: Emergency Medicine

## 2019-10-28 DIAGNOSIS — H538 Other visual disturbances: Secondary | ICD-10-CM | POA: Diagnosis present

## 2019-10-28 DIAGNOSIS — I6340 Cerebral infarction due to embolism of unspecified cerebral artery: Secondary | ICD-10-CM | POA: Diagnosis not present

## 2019-10-28 DIAGNOSIS — I771 Stricture of artery: Secondary | ICD-10-CM | POA: Diagnosis present

## 2019-10-28 DIAGNOSIS — G8191 Hemiplegia, unspecified affecting right dominant side: Secondary | ICD-10-CM | POA: Diagnosis present

## 2019-10-28 DIAGNOSIS — E1122 Type 2 diabetes mellitus with diabetic chronic kidney disease: Secondary | ICD-10-CM | POA: Diagnosis present

## 2019-10-28 DIAGNOSIS — Z23 Encounter for immunization: Secondary | ICD-10-CM

## 2019-10-28 DIAGNOSIS — D631 Anemia in chronic kidney disease: Secondary | ICD-10-CM | POA: Diagnosis present

## 2019-10-28 DIAGNOSIS — I160 Hypertensive urgency: Secondary | ICD-10-CM | POA: Diagnosis present

## 2019-10-28 DIAGNOSIS — Z7984 Long term (current) use of oral hypoglycemic drugs: Secondary | ICD-10-CM

## 2019-10-28 DIAGNOSIS — N189 Chronic kidney disease, unspecified: Secondary | ICD-10-CM | POA: Diagnosis present

## 2019-10-28 DIAGNOSIS — E1142 Type 2 diabetes mellitus with diabetic polyneuropathy: Secondary | ICD-10-CM | POA: Diagnosis present

## 2019-10-28 DIAGNOSIS — Z20822 Contact with and (suspected) exposure to covid-19: Secondary | ICD-10-CM | POA: Diagnosis present

## 2019-10-28 DIAGNOSIS — R29700 NIHSS score 0: Secondary | ICD-10-CM | POA: Diagnosis present

## 2019-10-28 DIAGNOSIS — E785 Hyperlipidemia, unspecified: Secondary | ICD-10-CM | POA: Diagnosis present

## 2019-10-28 DIAGNOSIS — I129 Hypertensive chronic kidney disease with stage 1 through stage 4 chronic kidney disease, or unspecified chronic kidney disease: Secondary | ICD-10-CM | POA: Diagnosis present

## 2019-10-28 DIAGNOSIS — G459 Transient cerebral ischemic attack, unspecified: Secondary | ICD-10-CM | POA: Diagnosis not present

## 2019-10-28 NOTE — ED Triage Notes (Signed)
Right sided headache x 3 days.  Pt states some visual changes, blurry.  Generalized weakness.  No facial droop noted.   Grips equal.   No upper or lower extremity weakness noted.  Pt states he has some intermittent numbness on right side for three days.

## 2019-10-28 NOTE — ED Triage Notes (Signed)
Pt c/o numbness to right UE and right side of mouth x 3 days-HA to right occipital x 2 days-denies injury-also c/o bilat LE swelling x 1 month-NAD-steady gait

## 2019-10-29 ENCOUNTER — Observation Stay (HOSPITAL_BASED_OUTPATIENT_CLINIC_OR_DEPARTMENT_OTHER): Payer: No Typology Code available for payment source

## 2019-10-29 ENCOUNTER — Other Ambulatory Visit: Payer: Self-pay

## 2019-10-29 ENCOUNTER — Observation Stay (HOSPITAL_COMMUNITY): Payer: No Typology Code available for payment source

## 2019-10-29 ENCOUNTER — Inpatient Hospital Stay (HOSPITAL_BASED_OUTPATIENT_CLINIC_OR_DEPARTMENT_OTHER)
Admission: EM | Admit: 2019-10-29 | Discharge: 2019-10-31 | DRG: 684 | Disposition: A | Discharge status: Home / Self Care | Payer: No Typology Code available for payment source | Attending: Family Medicine | Admitting: Family Medicine

## 2019-10-29 DIAGNOSIS — I129 Hypertensive chronic kidney disease with stage 1 through stage 4 chronic kidney disease, or unspecified chronic kidney disease: Secondary | ICD-10-CM | POA: Diagnosis not present

## 2019-10-29 DIAGNOSIS — G8191 Hemiplegia, unspecified affecting right dominant side: Secondary | ICD-10-CM | POA: Diagnosis not present

## 2019-10-29 DIAGNOSIS — E1122 Type 2 diabetes mellitus with diabetic chronic kidney disease: Secondary | ICD-10-CM | POA: Diagnosis not present

## 2019-10-29 DIAGNOSIS — I639 Cerebral infarction, unspecified: Secondary | ICD-10-CM | POA: Diagnosis present

## 2019-10-29 DIAGNOSIS — G459 Transient cerebral ischemic attack, unspecified: Secondary | ICD-10-CM | POA: Diagnosis present

## 2019-10-29 DIAGNOSIS — H538 Other visual disturbances: Secondary | ICD-10-CM | POA: Diagnosis not present

## 2019-10-29 DIAGNOSIS — I10 Essential (primary) hypertension: Secondary | ICD-10-CM

## 2019-10-29 DIAGNOSIS — I160 Hypertensive urgency: Secondary | ICD-10-CM | POA: Diagnosis not present

## 2019-10-29 DIAGNOSIS — R29700 NIHSS score 0: Secondary | ICD-10-CM | POA: Diagnosis not present

## 2019-10-29 DIAGNOSIS — R2990 Unspecified symptoms and signs involving the nervous system: Secondary | ICD-10-CM

## 2019-10-29 DIAGNOSIS — N189 Chronic kidney disease, unspecified: Secondary | ICD-10-CM | POA: Diagnosis not present

## 2019-10-29 DIAGNOSIS — D631 Anemia in chronic kidney disease: Secondary | ICD-10-CM | POA: Diagnosis not present

## 2019-10-29 DIAGNOSIS — Z20822 Contact with and (suspected) exposure to covid-19: Secondary | ICD-10-CM | POA: Diagnosis not present

## 2019-10-29 DIAGNOSIS — E1142 Type 2 diabetes mellitus with diabetic polyneuropathy: Secondary | ICD-10-CM | POA: Diagnosis not present

## 2019-10-29 DIAGNOSIS — Z7984 Long term (current) use of oral hypoglycemic drugs: Secondary | ICD-10-CM | POA: Diagnosis not present

## 2019-10-29 DIAGNOSIS — E785 Hyperlipidemia, unspecified: Secondary | ICD-10-CM | POA: Diagnosis not present

## 2019-10-29 DIAGNOSIS — R200 Anesthesia of skin: Secondary | ICD-10-CM | POA: Diagnosis not present

## 2019-10-29 DIAGNOSIS — I6340 Cerebral infarction due to embolism of unspecified cerebral artery: Secondary | ICD-10-CM

## 2019-10-29 DIAGNOSIS — I771 Stricture of artery: Secondary | ICD-10-CM | POA: Diagnosis not present

## 2019-10-29 DIAGNOSIS — Z23 Encounter for immunization: Secondary | ICD-10-CM | POA: Diagnosis not present

## 2019-10-29 HISTORY — DX: Unspecified symptoms and signs involving the nervous system: R29.90

## 2019-10-29 HISTORY — DX: Transient cerebral ischemic attack, unspecified: G45.9

## 2019-10-29 LAB — URINALYSIS, MICROSCOPIC (REFLEX)
Bacteria, UA: NONE SEEN
Squamous Epithelial / HPF: NONE SEEN (ref 0–5)

## 2019-10-29 LAB — DIFFERENTIAL
Abs Immature Granulocytes: 0.01 10*3/uL (ref 0.00–0.07)
Basophils Absolute: 0 10*3/uL (ref 0.0–0.1)
Basophils Relative: 1 %
Eosinophils Absolute: 0.1 10*3/uL (ref 0.0–0.5)
Eosinophils Relative: 3 %
Immature Granulocytes: 0 %
Lymphocytes Relative: 35 %
Lymphs Abs: 1.6 10*3/uL (ref 0.7–4.0)
Monocytes Absolute: 0.4 10*3/uL (ref 0.1–1.0)
Monocytes Relative: 9 %
Neutro Abs: 2.5 10*3/uL (ref 1.7–7.7)
Neutrophils Relative %: 52 %

## 2019-10-29 LAB — URINALYSIS, ROUTINE W REFLEX MICROSCOPIC
Bilirubin Urine: NEGATIVE
Glucose, UA: 100 mg/dL — AB
Ketones, ur: NEGATIVE mg/dL
Leukocytes,Ua: NEGATIVE
Nitrite: NEGATIVE
Protein, ur: >300 mg/dL — AB
Specific Gravity, Urine: 1.025 (ref 1.005–1.030)
pH: 6 (ref 5.0–8.0)

## 2019-10-29 LAB — ETHANOL: Alcohol, Ethyl (B): <10 mg/dL (ref <10)

## 2019-10-29 LAB — CBC
HCT: 39.1 % (ref 39.0–52.0)
Hemoglobin: 12.6 g/dL — ABNORMAL LOW (ref 13.0–17.0)
MCH: 27.1 pg (ref 26.0–34.0)
MCHC: 32.2 g/dL (ref 30.0–36.0)
MCV: 84.1 fL (ref 80.0–100.0)
Platelets: 154 10*3/uL (ref 150–400)
RBC: 4.65 MIL/uL (ref 4.22–5.81)
RDW: 13.2 % (ref 11.5–15.5)
WBC: 4.7 10*3/uL (ref 4.0–10.5)
nRBC: 0 % (ref 0.0–0.2)

## 2019-10-29 LAB — GLUCOSE, CAPILLARY
Glucose-Capillary: 117 mg/dL — ABNORMAL HIGH (ref 70–99)
Glucose-Capillary: 146 mg/dL — ABNORMAL HIGH (ref 70–99)

## 2019-10-29 LAB — COMPREHENSIVE METABOLIC PANEL
ALT: 18 U/L (ref 0–44)
AST: 18 U/L (ref 15–41)
Albumin: 2.6 g/dL — ABNORMAL LOW (ref 3.5–5.0)
Alkaline Phosphatase: 94 U/L (ref 38–126)
Anion gap: 6 (ref 5–15)
BUN: 18 mg/dL (ref 6–20)
CO2: 23 mmol/L (ref 22–32)
Calcium: 8.3 mg/dL — ABNORMAL LOW (ref 8.9–10.3)
Chloride: 110 mmol/L (ref 98–111)
Creatinine, Ser: 1.53 mg/dL — ABNORMAL HIGH (ref 0.61–1.24)
GFR calc Af Amer: 57 mL/min — ABNORMAL LOW (ref >60)
GFR calc non Af Amer: 49 mL/min — ABNORMAL LOW (ref >60)
Glucose, Bld: 148 mg/dL — ABNORMAL HIGH (ref 70–99)
Potassium: 3.9 mmol/L (ref 3.5–5.1)
Sodium: 139 mmol/L (ref 135–145)
Total Bilirubin: 0.5 mg/dL (ref 0.3–1.2)
Total Protein: 6.3 g/dL — ABNORMAL LOW (ref 6.5–8.1)

## 2019-10-29 LAB — RAPID URINE DRUG SCREEN, HOSP PERFORMED
Amphetamines: NOT DETECTED
Barbiturates: NOT DETECTED
Benzodiazepines: NOT DETECTED
Cocaine: NOT DETECTED
Opiates: NOT DETECTED
Tetrahydrocannabinol: NOT DETECTED

## 2019-10-29 LAB — SARS CORONAVIRUS 2 BY RT PCR (HOSPITAL ORDER, PERFORMED IN ~~LOC~~ HOSPITAL LAB): SARS Coronavirus 2: NEGATIVE

## 2019-10-29 LAB — APTT: aPTT: 28 seconds (ref 24–36)

## 2019-10-29 LAB — PROTIME-INR
INR: 0.9 (ref 0.8–1.2)
Prothrombin Time: 11.9 seconds (ref 11.4–15.2)

## 2019-10-29 LAB — CBG MONITORING, ED: Glucose-Capillary: 97 mg/dL (ref 70–99)

## 2019-10-29 MED ORDER — AMLODIPINE BESYLATE 10 MG PO TABS
10.0000 mg | ORAL_TABLET | Freq: Every day | ORAL | Status: DC
  Administered 2019-10-29: 10 mg via ORAL

## 2019-10-29 MED ORDER — INSULIN ASPART 100 UNIT/ML ~~LOC~~ SOLN: 0.0000 [IU] | Freq: Every day | SUBCUTANEOUS | Status: DC

## 2019-10-29 MED ORDER — ENOXAPARIN SODIUM 40 MG/0.4ML ~~LOC~~ SOLN
40.0000 mg | SUBCUTANEOUS | Status: DC
  Administered 2019-10-29 – 2019-10-30 (×2): 40 mg via SUBCUTANEOUS
  Filled 2019-10-29 (×2): qty 0.4

## 2019-10-29 MED ORDER — METFORMIN HCL 500 MG PO TABS
500.0000 mg | ORAL_TABLET | Freq: Two times a day (BID) | ORAL | Status: DC
  Administered 2019-10-29: 500 mg via ORAL
  Filled 2019-10-29: qty 1

## 2019-10-29 MED ORDER — ASPIRIN EC 81 MG PO TBEC: 81.0000 mg | DELAYED_RELEASE_TABLET | Freq: Every day | ORAL | Status: DC

## 2019-10-29 MED ORDER — CLOPIDOGREL BISULFATE 75 MG PO TABS
75.0000 mg | ORAL_TABLET | Freq: Every day | ORAL | Status: DC
  Administered 2019-10-30 – 2019-10-31 (×2): 75 mg via ORAL
  Filled 2019-10-29 (×2): qty 1

## 2019-10-29 MED ORDER — LACTATED RINGERS IV SOLN: INTRAVENOUS | Status: AC

## 2019-10-29 MED ORDER — ACETAMINOPHEN 160 MG/5ML PO SOLN: 650.0000 mg | ORAL | Status: DC | PRN

## 2019-10-29 MED ORDER — ASPIRIN 81 MG PO CHEW
324.0000 mg | CHEWABLE_TABLET | Freq: Every day | ORAL | Status: DC
  Administered 2019-10-29: 324 mg via ORAL

## 2019-10-29 MED ORDER — PNEUMOCOCCAL VAC POLYVALENT 25 MCG/0.5ML IJ INJ
0.5000 mL | INJECTION | INTRAMUSCULAR | Status: AC
  Administered 2019-10-30: 0.5 mL via INTRAMUSCULAR
  Filled 2019-10-29 (×2): qty 0.5

## 2019-10-29 MED ORDER — INSULIN ASPART 100 UNIT/ML ~~LOC~~ SOLN
0.0000 [IU] | Freq: Three times a day (TID) | SUBCUTANEOUS | Status: DC
  Administered 2019-10-30 – 2019-10-31 (×2): 2 [IU] via SUBCUTANEOUS

## 2019-10-29 MED ORDER — CLOPIDOGREL BISULFATE 300 MG PO TABS
300.0000 mg | ORAL_TABLET | Freq: Once | ORAL | Status: AC
  Administered 2019-10-29: 300 mg via ORAL
  Filled 2019-10-29: qty 1

## 2019-10-29 MED ORDER — ASPIRIN 81 MG PO CHEW
CHEWABLE_TABLET | ORAL | Status: AC
  Filled 2019-10-29: qty 3

## 2019-10-29 MED ORDER — IOHEXOL 350 MG/ML SOLN
80.0000 mL | Freq: Once | INTRAVENOUS | Status: AC | PRN
  Administered 2019-10-29: 80 mL via INTRAVENOUS

## 2019-10-29 MED ORDER — AMLODIPINE BESYLATE 5 MG PO TABS
5.0000 mg | ORAL_TABLET | Freq: Once | ORAL | Status: AC
  Administered 2019-10-29: 5 mg via ORAL
  Filled 2019-10-29: qty 1

## 2019-10-29 MED ORDER — STROKE: EARLY STAGES OF RECOVERY BOOK
Freq: Once | Status: AC
  Filled 2019-10-29: qty 1

## 2019-10-29 MED ORDER — ASPIRIN 81 MG PO CHEW: 324.0000 mg | CHEWABLE_TABLET | Freq: Once | ORAL | Status: DC

## 2019-10-29 MED ORDER — ACETAMINOPHEN 325 MG PO TABS: 650.0000 mg | ORAL_TABLET | ORAL | Status: DC | PRN

## 2019-10-29 MED ORDER — LABETALOL HCL 5 MG/ML IV SOLN: 10.0000 mg | INTRAVENOUS | Status: DC | PRN

## 2019-10-29 MED ORDER — AMLODIPINE BESYLATE 5 MG PO TABS
ORAL_TABLET | ORAL | Status: AC
  Filled 2019-10-29: qty 2

## 2019-10-29 MED ORDER — ACETAMINOPHEN 650 MG RE SUPP: 650.0000 mg | RECTAL | Status: DC | PRN

## 2019-10-29 MED ORDER — ASPIRIN 81 MG PO CHEW
CHEWABLE_TABLET | ORAL | Status: AC
  Filled 2019-10-29: qty 1

## 2019-10-29 MED ORDER — ENSURE ENLIVE PO LIQD
237.0000 mL | Freq: Two times a day (BID) | ORAL | Status: DC
  Administered 2019-10-30 – 2019-10-31 (×3): 237 mL via ORAL

## 2019-10-29 NOTE — ED Notes (Signed)
Tele Neuro at bedside 

## 2019-10-29 NOTE — ED Notes (Signed)
Report given to [facility] with [facility]

## 2019-10-29 NOTE — ED Notes (Signed)
BP rechecked in lt arm with a larger BP cuff

## 2019-10-29 NOTE — Consult Note (Signed)
[facility] Tele[facility]urology Consult Services  Stat Consult  Date of Service:   10/29/2019 03:21:16  Impression:     .  R53.1 - Weakness     .  R29.810 - Facial numbness/ Facial weakness  Comments/Sign-Out: [name] is a 59 year old man here with numbness, weakness and spells of blurry vision. Exam is normal which is reassuring. CT head is negative. Given spells of right sided numbness and weakness, concern for tia vs stroke. Recommend CTA head and neck now. And MRI Brain for further evaluation. Ok to start asa 81 and plavix 300 now, and then 21 days of plavix 75 + asa 81.  CT HEAD: Showed No Acute Hemorrhage or Acute Core Infarct  Metrics: [facility] Notification Time: 10/29/2019 03:19:55 Stamp Time: 10/29/2019 03:21:16 Callback Response Time: 10/29/2019 03:21:57  Our recommendations are outlined below.   Therapies:     .  Physical Therapy, Occupational Therapy, Speech Therapy Assessment When Applicable  Disposition: Neurology Follow Up Recommended  Sign Out:     .  Discussed with Emergency Department Provider  ----------------------------------------------------------------------------------------------------  Chief Complaint: weakness  History of Present Illness: Patient is a 59 year old Male.  [name] is a 59 year old man here right sided numbness. He ahs been having spells of right sided numbness and weakness with blurry vision and word finding difficultiies. He also has light headedness during these spells. He currently reports ongoing blurry vision. He reports he is not frequently under doctors carea and he was surprised by his blood pressure.           Examination: BP(206/111), Pulse(73), Blood Glucose(148) 1A: Level of Consciousness - Alert; keenly responsive + 0 1B: Ask Month and Age - Both Questions Right + 0 1C: Blink Eyes & Squeeze Hands - Performs Both Tasks + 0 2: Test Horizontal Extraocular Movements - Normal + 0 3: Test  Visual Fields - No Visual Loss + 0 4: Test Facial Palsy (Use Grimace if Obtunded) - Normal symmetry + 0 5A: Test Left Arm Motor Drift - No Drift for 10 Seconds + 0 5B: Test Right Arm Motor Drift - No Drift for 10 Seconds + 0 6A: Test Left Leg Motor Drift - No Drift for 5 Seconds + 0 6B: Test Right Leg Motor Drift - No Drift for 5 Seconds + 0 7: Test Limb Ataxia (FNF/Heel-Shin) - No Ataxia + 0 8: Test Sensation - Normal; No sensory loss + 0 9: Test Language/Aphasia - Normal; No aphasia + 0 10: Test Dysarthria - Normal + 0 11: Test Extinction/Inattention - No abnormality + 0  NIHSS Score: 0    Patient is being evaluated for possible acute neurologic impairment and high probability of imminent or life-threatening deterioration. I spent total of 25 minutes providing care to this patient, including time for face to face visit via telemedicine, review of medical records, imaging studies and discussion of findings with providers, the patient and/or family.   Dr Ashley Jacobs   [facility] [phone]  Case [id number]

## 2019-10-29 NOTE — ED Notes (Signed)
Pt contacts: Belenda Cruise (son): [phone] Linwood Dibbles (ex-wife): [phone]

## 2019-10-29 NOTE — H&P (Signed)
History and Physical    Douglas Briggs MPN:[id number] DOB: 02/29/1961 DOA: 10/29/2019  PCP: Douglas Maid, MD  Patient coming from: home  I have personally briefly reviewed patient's old medical records in [place]  Chief Complaint: right sided numbness, blurry vision  HPI: Douglas Briggs is Douglas Briggs 59 y.o. male with medical history significant of hypertension and diabetes, but has not seen Douglas Briggs physician in several years, presents with R sided numbness and blurry vision intermittent over the past several days.  He notes his symptoms started several days ago.  He began having R sided numbness that was intermittent.  Numbness to the R side of face, R arm, and R leg.  He also noted intermittent blurry vision and lightheadedness.  He also noted some word finding difficulties.  He denied any slurred speech or facial droop to me.  He did note weakness during these episodes, he says the weakness was to bilateral lower extremities.  He noted several episodes over the past several days.  He came to the ED yesterday, but wasn't able to be seen so he returned today.  His symptoms are currently resolved.  No CP, SOB, abdominal pain, N/V.  No fevers.  He notes swelling to his legs over past several weeks.  He doesn't smoke or use drugs.  Occasional etoh use.  Denies fh of heart attack or stroke.    ED Course: Labs, EKG, imaging.  Teleneurology c/s, recommended CTA head and neck, MRI brain.  Start ASA/plavix.  Review of Systems: As per HPI otherwise all other systems reviewed and are negative.  Past Medical History:  Diagnosis Date  . Diabetes mellitus without complication ([place])   . Neuropathy     History reviewed. No pertinent surgical history.  Social History  reports that he has never smoked. He has never used smokeless tobacco. He reports that he does not drink alcohol or use drugs.  No Known Allergies  No family history on file. No FH of MI or stroke that he knows of  Prior to Admission  medications   Not on File    Physical Exam: Vitals:   10/29/19 1400 10/29/19 1500 10/29/19 1608 10/29/19 1647  BP: (!) 144/78 (!) 166/95  (!) 166/101  Pulse: 81 86  88  Resp: 16   19  Temp:   98.4 F (36.9 C) 98.4 F (36.9 C)  TempSrc:   Oral   SpO2: 100% 100%  100%  Weight:      Height:        Constitutional: NAD, calm, comfortable Vitals:   10/29/19 1400 10/29/19 1500 10/29/19 1608 10/29/19 1647  BP: (!) 144/78 (!) 166/95  (!) 166/101  Pulse: 81 86  88  Resp: 16   19  Temp:   98.4 F (36.9 C) 98.4 F (36.9 C)  TempSrc:   Oral   SpO2: 100% 100%  100%  Weight:      Height:       Eyes: PERRL, lids and conjunctivae normal ENMT: Mucous membranes are moist. Posterior pharynx clear of any exudate or lesions.Normal dentition.  Neck: normal, supple, no masses, no thyromegaly Respiratory: clear to auscultation bilaterally, no wheezing, no crackles. Normal respiratory effort. No accessory muscle use.  Cardiovascular: Regular rate and rhythm, no murmurs / rubs / gallops. Bilateral Lower extremity edema, 1+.   Abdomen: no tenderness, no masses palpated. No hepatosplenomegaly. Bowel sounds positive.  Musculoskeletal: no clubbing / cyanosis. No joint deformity upper and lower extremities. Good ROM, no contractures. Normal muscle  tone.  Skin: no rashes, lesions, ulcers. No induration Neurologic: CN 2-12 intact. Sensation intact to light touch. Strength 5/5 in all 4.  Psychiatric: Normal judgment and insight. Alert and oriented x 3. Normal mood.   Labs on Admission: I have personally reviewed following labs and imaging studies  CBC: Recent Labs  Lab 10/29/19 0149  WBC 4.7  NEUTROABS 2.5  HGB 12.6*  HCT 39.1  MCV 84.1  PLT 884    Basic Metabolic Panel: Recent Labs  Lab 10/29/19 0149  NA 139  K 3.9  CL 110  CO2 23  GLUCOSE 148*  BUN 18  CREATININE 1.53*  CALCIUM 8.3*    GFR: Estimated Creatinine Clearance: 50.3 mL/min (Douglas Briggs) (by C-G formula based on SCr of 1.53  mg/dL (H)).  Liver Function Tests: Recent Labs  Lab 10/29/19 0149  AST 18  ALT 18  ALKPHOS 94  BILITOT 0.5  PROT 6.3*  ALBUMIN 2.6*    Urine analysis:    Component Value Date/Time   COLORURINE YELLOW 10/29/2019 0527   APPEARANCEUR CLEAR 10/29/2019 0527   LABSPEC 1.025 10/29/2019 0527   PHURINE 6.0 10/29/2019 0527   GLUCOSEU 100 (Douglas Briggs) 10/29/2019 0527   HGBUR MODERATE (Douglas Briggs) 10/29/2019 0527   BILIRUBINUR NEGATIVE 10/29/2019 0527   KETONESUR NEGATIVE 10/29/2019 0527   PROTEINUR >300 (Douglas Briggs) 10/29/2019 0527   UROBILINOGEN 0.2 05/28/2009 1405   NITRITE NEGATIVE 10/29/2019 0527   LEUKOCYTESUR NEGATIVE 10/29/2019 0527    Radiological Exams on Admission: CT Angio Head W or Wo Contrast  Result Date: 10/29/2019 CLINICAL DATA:  Intermittent right-sided numbness and weakness EXAM: CT ANGIOGRAPHY HEAD AND NECK TECHNIQUE: Multidetector CT imaging of the head and neck was performed using the standard protocol during bolus administration of intravenous contrast. Multiplanar CT image reconstructions and MIPs were obtained to evaluate the vascular anatomy. Carotid stenosis measurements (when applicable) are obtained utilizing NASCET criteria, using the distal internal carotid diameter as the denominator. CONTRAST:  64mL OMNIPAQUE IOHEXOL 350 MG/ML SOLN COMPARISON:  Head CT from earlier today FINDINGS: CT HEAD FINDINGS Brain: No evidence of acute infarction, hemorrhage, hydrocephalus, extra-axial collection or mass lesion/mass effect. Vascular: No hyperdense vessel or unexpected calcification. Skull: Normal. Negative for fracture or focal lesion. Sinuses: No acute finding Orbits: Negative Review of the MIP images confirms the above findings CTA NECK FINDINGS Aortic arch: Atherosclerotic plaque at the arch. There is partial obscuration due to streak from intravenous contrast. Right carotid system: Low-density atheromatous plaque along the distal common carotid. Mild plaque at the bifurcation. No flow limiting  stenosis or ulceration. Left carotid system: Mild atheromatous changes without stenosis or ulceration. Vertebral arteries: No proximal subclavian stenosis. 50% narrowing at the proximal right vertebral artery. No beading or flow limiting stenosis. Skeleton: No acute finding Other neck: Negative Upper chest: Negative Review of the MIP images confirms the above findings CTA HEAD FINDINGS Anterior circulation: Calcified plaque on the carotid siphons with severe left cavernous and supraclinoid ICA narrowing to the degree that the lumen is difficult to accurately measure. The left ICA is smaller than the right from hypoplastic left A1 segment and possibly from superimposed underfilling. No branch occlusion, beading, or aneurysm. There is Jailen Coward moderate right M2/3 segment stenosis best seen on sagittal reformats. Posterior circulation: Vertebral and basilar arteries are smooth and widely patent. High-grade left P2 segment narrowing, see sagittal reformats. Negative for aneurysm Venous sinuses: Diffusely patent Anatomic variants: As above Review of the MIP images confirms the above findings IMPRESSION: 1. 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 2. Advanced left P2 segment stenosis. 3. 50% right V1 segment stenosis. Electronically Signed   By: Monte Fantasia M.D.   On: 10/29/2019 06:32   CT Head Wo Contrast  Result Date: 10/28/2019 CLINICAL DATA:  Right side numbness EXAM: CT HEAD WITHOUT CONTRAST TECHNIQUE: Contiguous axial images were obtained from the base of the skull through the vertex without intravenous contrast. COMPARISON:  None. FINDINGS: Brain: No acute intracranial abnormality. Specifically, no hemorrhage, hydrocephalus, mass lesion, acute infarction, or significant intracranial injury. Vascular: No hyperdense vessel or unexpected calcification. Skull: No acute calvarial abnormality. Sinuses/Orbits: Visualized paranasal sinuses and mastoids clear. Orbital soft tissues unremarkable. Other: None  IMPRESSION: No acute intracranial abnormality. Electronically Signed   By: Rolm Baptise M.D.   On: 10/28/2019 22:36   CT Angio Neck W and/or Wo Contrast  Result Date: 10/29/2019 CLINICAL DATA:  Intermittent right-sided numbness and weakness EXAM: CT ANGIOGRAPHY HEAD AND NECK TECHNIQUE: Multidetector CT imaging of the head and neck was performed using the standard protocol during bolus administration of intravenous contrast. Multiplanar CT image reconstructions and MIPs were obtained to evaluate the vascular anatomy. Carotid stenosis measurements (when applicable) are obtained utilizing NASCET criteria, using the distal internal carotid diameter as the denominator. CONTRAST:  14mL OMNIPAQUE IOHEXOL 350 MG/ML SOLN COMPARISON:  Head CT from earlier today FINDINGS: CT HEAD FINDINGS Brain: No evidence of acute infarction, hemorrhage, hydrocephalus, extra-axial collection or mass lesion/mass effect. Vascular: No hyperdense vessel or unexpected calcification. Skull: Normal. Negative for fracture or focal lesion. Sinuses: No acute finding Orbits: Negative Review of the MIP images confirms the above findings CTA NECK FINDINGS Aortic arch: Atherosclerotic plaque at the arch. There is partial obscuration due to streak from intravenous contrast. Right carotid system: Low-density atheromatous plaque along the distal common carotid. Mild plaque at the bifurcation. No flow limiting stenosis or ulceration. Left carotid system: Mild atheromatous changes without stenosis or ulceration. Vertebral arteries: No proximal subclavian stenosis. 50% narrowing at the proximal right vertebral artery. No beading or flow limiting stenosis. Skeleton: No acute finding Other neck: Negative Upper chest: Negative Review of the MIP images confirms the above findings CTA HEAD FINDINGS Anterior circulation: Calcified plaque on the carotid siphons with severe left cavernous and supraclinoid ICA narrowing to the degree that the lumen is difficult to  accurately measure. The left ICA is smaller than the right from hypoplastic left A1 segment and possibly from superimposed underfilling. No branch occlusion, beading, or aneurysm. There is Samyria Rudie moderate right M2/3 segment stenosis best seen on sagittal reformats. Posterior circulation: Vertebral and basilar arteries are smooth and widely patent. High-grade left P2 segment narrowing, see sagittal reformats. Negative for aneurysm Venous sinuses: Diffusely patent Anatomic variants: As above Review of the MIP images confirms the above findings IMPRESSION: 1. 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 2. Advanced left P2 segment stenosis. 3. 50% right V1 segment stenosis. Electronically Signed   By: Monte Fantasia M.D.   On: 10/29/2019 06:32    EKG: Independently reviewed. NSR, no priors for comparison, LVH, j point elevation  Assessment/Plan Active Problems:   TIA (transient ischemic attack)   Stroke-like symptoms   Stroke Like Symptoms: Intermittent R sided numbness with word finding difficulty and lower extremity weakness prior to admission - currently sx are resolved CT head without acute finding CTA head/neck with severe L cavernous and supraclinoid ICA stenosis, advanced P2 segment stenosis, 50% right V1 segment stenosis Teleneurology c/s, appreciate recs -> CTA head/neck, MRI brain, ASA/plavix x21 days.  Will c/s neurology here MRI brain Echo, telemetry A1c, lipid panel SLP, PT, OT Permissive hypertension for now  Bilateral Lower extremity Edema:  Follow echo UP/C for proteinuria on UA  T2DM on metformin at home Hold metformin, SSI Follow A1c, lipid panel  Hypertension:  Permissive hypertension for now  Elevated Creatinine:  Unclear if CKD vs AKI, follow with IVF Follow UP/C Suspect CKD  DVT prophylaxis: lovenox  Code Status:   full (Full/Partial (specify details) Family Communication:  None at bedside (Specify name, relationship. Do not write "discussed with  patient". Specify tel # if discussed over the phone) Disposition Plan:   Patient is from:  home  Anticipated DC to:  home  Anticipated DC date:  6/5  Anticipated DC barriers: Pending stroke/TIA workup  Consults called:  neurology  Admission status:  observation   Severity of Illness: The appropriate patient status for this patient is OBSERVATION. Observation status is judged to be reasonable and necessary in order to provide the required intensity of service to ensure the patient's safety. The patient's presenting symptoms, physical exam findings, and initial radiographic and laboratory data in the context of their medical condition is felt to place them at decreased risk for further clinical deterioration. Furthermore, it is anticipated that the patient will be medically stable for discharge from the hospital within 2 midnights of admission. The following factors support the patient status of observation.   " The patient's presenting symptoms include numbness, weakness, blurry vision, word finding difficulties. " The physical exam findings include bilateral lower extremity edema. " The initial radiographic and laboratory data are CTA with severe L cavernous and supraclinoid ICA stenosis, advanced L P2 segment stenosis, and 50% right V1 segment stenosis.      Fayrene Helper MD [facility]  How to contact the [facility] Attending or Consulting provider [place] or covering provider during after hours [place], for this patient?   1. Check the care team in [facility] and look for Tacora Athanas) attending/consulting [facility] provider listed and b) the [facility] team listed 2. Log into [email or web address] and use [place] universal password to access. If you do not have the password, please contact the hospital operator. 3. Locate the [facility] provider you are looking for under [facility] and page to Humaira Sculley number that you can be directly reached. 4. If you still have difficulty reaching the provider, please page the [facility]  (Director on Call) for the Hospitalists listed on amion for assistance.  10/29/2019, 5:03 PM

## 2019-10-29 NOTE — ED Provider Notes (Signed)
CHIEF COMPLAINT: Intermittent right-sided numbness, blurry vision, difficulty getting out words  HPI: Patient is a 59 year old male with history of non-insulin-dependent diabetes, hyperlipidemia who states he has not seen a doctor in many years and is taking Metformin 500 mg twice daily only sporadically who presents emergency department with episode of lightheadedness and generalized weakness that started about a week ago.  States 3 to 4 days ago he started having right-sided facial, arm and leg numbness that would last about 5 to 10 minutes at a time and he would have intermittent blurry vision and difficulty getting out his words.  He states at times these episodes he would have weakness on the side.  He has no symptoms currently but does state that he just had an episode prior to arrival.  He is also had some intermittent headaches but states that headaches do not always occur with the neurologic deficits and he has no previous history of headaches.  No current headache.  Blood pressures here are extremely elevated.  Denies history of the same.  No head injury.  Not on blood thinners.  No chest pain or shortness of breath.  ROS: See HPI Constitutional: no fever  Eyes: no drainage  ENT: no runny nose   Cardiovascular:  no chest pain  Resp: no SOB  GI: no vomiting GU: no dysuria Integumentary: no rash  Allergy: no hives  Musculoskeletal: no leg swelling  Neurological: no slurred speech ROS otherwise negative  PAST MEDICAL HISTORY/PAST SURGICAL HISTORY:  Past Medical History:  Diagnosis Date  . Diabetes mellitus without complication ([place])   . Neuropathy     MEDICATIONS:  Prior to Admission medications   Medication Sig Start Date End Date Taking? Authorizing Provider  cephALEXin (KEFLEX) 500 MG capsule Take 1 capsule (500 mg total) by mouth 3 (three) times daily. 12/04/17   Kirichenko, Tatyana, PA-C  gabapentin (NEURONTIN) 300 MG capsule Take 300 mg by mouth at bedtime.    [provider]  gabapentin (NEURONTIN) 300 MG capsule TAKE 1 CAPSULES IN THE MORNING AND TAKE 1 CAPSULE AT [facility] 2 CAPSULE AT BEDTIME 01/15/16   Hyatt, Max T, DPM  hydrOXYzine (ATARAX/VISTARIL) 25 MG tablet Take 1 tablet (25 mg total) by mouth every 6 (six) hours. 12/04/17   Kirichenko, Tatyana, PA-C  ibuprofen (ADVIL,MOTRIN) 800 MG tablet Take 1 tablet (800 mg total) by mouth 3 (three) times daily. 08/30/17   Palumbo, April, MD  Insulin Glargine (LANTUS) 100 UNIT/ML Solostar Pen Inject into the skin 2 (two) times daily.    [provider]  metFORMIN (GLUCOPHAGE) 1000 MG tablet Take 1,000 mg by mouth 2 (two) times daily with a meal.    [provider]  triamcinolone cream (KENALOG) 0.1 % Apply 1 application topically 2 (two) times daily. 12/04/17   Kirichenko, Lahoma Rocker, PA-C    ALLERGIES:  No Known Allergies  SOCIAL HISTORY:  Social History   Tobacco Use  . Smoking status: Never Smoker  . Smokeless tobacco: Never Used  Substance Use Topics  . Alcohol use: No    FAMILY HISTORY: No family history on file.  EXAM: BP (!) 207/104   Pulse 72   Temp 98.2 F (36.8 C)   Resp 14   Ht 5\' 8"  (1.727 m)   Wt 78.5 kg   SpO2 100%   BMI 26.30 kg/m  CONSTITUTIONAL: Alert and oriented and responds appropriately to questions. Well-appearing; well-nourished HEAD: Normocephalic EYES: Conjunctivae clear, pupils appear equal, EOM appear intact ENT: normal nose; moist mucous  membranes NECK: Supple, normal ROM CARD: RRR; S1 and S2 appreciated; no murmurs, no clicks, no rubs, no gallops RESP: Normal chest excursion without splinting or tachypnea; breath sounds clear and equal bilaterally; no wheezes, no rhonchi, no rales, no hypoxia or respiratory distress, speaking full sentences ABD/GI: Normal bowel sounds; non-distended; soft, non-tender, no rebound, no guarding, no peritoneal signs, no hepatosplenomegaly BACK:  The back appears normal EXT: Normal ROM in all joints; no deformity  noted, no edema; no cyanosis SKIN: Normal color for age and race; warm; no rash on exposed skin NEURO: Moves all extremities equally, cranial nerves II through XII intact, normal speech, sensation to light touch intact diffusely, strength 5/5 in all 4 extremities PSYCH: The patient's mood and manner are appropriate.   MEDICAL DECISION MAKING: Patient here with episode concerning for recurrent TIA versus stroke.  Also on differential was complicated migraine.  He is hypertensive here but will allow for permissive hypertension given concern for stroke.  Head CT obtained in triage reviewed/interpreted and shows no acute abnormality.  Will discuss with telemetry neurology to evaluate patient and obtain labs, EKG, urine.  Anticipate admission.  NIH stroke scale currently 0.  Not a TPA candidate.  ED PROGRESS: Labs reviewed/interpreted and show elevation of kidney function with no recent for comparison.  EKG reviewed/interpreted and shows no arrhythmia.  Discussed with Dr. Noreene Larsson with tele neuro who recommends keeping blood pressures under 220s/110 is but otherwise allowing for permissive hypertension.  Received 5 mg of amlodipine with minimal improvement.  Will give second dose.  He also recommends "CTA head and neck now. And MRI Brain for further evaluation. Ok to start asa 81 and plavix 300 now, and then 21 days of plavix 75 + asa 81."   Date: 10/29/2019 1:37 AM  Rate: 72  Rhythm: normal sinus rhythm  QRS Axis: normal  Intervals: normal  ST/T Wave abnormalities: normal  Conduction Disutrbances: none  Narrative Interpretation: unremarkable, minimal anterior ST elevation and LVH, similar to previous       Douglas Briggs was evaluated in Emergency Department on 10/29/2019 for the symptoms described in the history of present illness. He was evaluated in the context of the global COVID-19 pandemic, which necessitated consideration that the patient might be at risk for infection with the SARS-CoV-2 virus  that causes COVID-19. Institutional protocols and algorithms that pertain to the evaluation of patients at risk for COVID-19 are in a state of rapid change based on information released by regulatory bodies including the CDC and federal and state organizations. These policies and algorithms were followed during the patient's care in the ED.      Verneta Hamidi, Delice Bison, DO 10/29/19 [phone]

## 2019-10-29 NOTE — ED Notes (Signed)
Pt given peanut butter and saltines PO along with water.

## 2019-10-29 NOTE — Consult Note (Addendum)
NEURO HOSPITALIST  CONSULT   Requesting Physician: Dr. Florene Glen     Chief Complaint: Right sided numbness and blurry vision  History obtained from:  Patient and Chart     HPI:                                                                                                                                         Douglas Briggs is a 59 y.o. male with a PMHx of HTN, DM and neuropathy who presented to [facility] with a chief complaint of intermittent right sided numbness and blurred vision over the past several days.   All symptoms started last Friday (5/28). It started with right sided numbness that comes and goes that includes the right face, arm and leg. The numbness travels - it is never the whole right side all at once. One episode may involve the face, then the next episode the leg.  Also had some intermittent blurred vision and lightheadedness, with word finding difficulty. He also had some weakness of the BLE during these episodes. Longest episode lasted 15 minutes. Denies taking daily ASA. Patient also denies going to a primary care physician, so he may have uncontrolled HTN and DM.  No ETOH, drug use or smoking.   ED course:  CT head (10/28/19): no acute abnormality CTA: No LVO; severe left cavernous and supraclinoid ICA stenosis. 50% right V1 segment stenosis.  BP: 166/101  CBG: 117   tPA Given: No; symptoms resolved Modified Rankin: Rankin Score=0 NIHSS: 0  Past Medical History:  Diagnosis Date  . Diabetes mellitus without complication ([place])   . Neuropathy   HTN  History reviewed. No pertinent surgical history.  No family history on file.       Social History:  reports that he has never smoked. He has never used smokeless tobacco. He reports that he does not drink alcohol or use drugs.  Allergies: No Known Allergies  Medications:  Scheduled: .  stroke: mapping our early stages of recovery book   Does not apply Once  . amLODipine      . [START ON 10/30/2019] aspirin EC  81 mg Oral Daily  . [START ON 10/30/2019] clopidogrel  75 mg Oral Daily  . enoxaparin (LOVENOX) injection  40 mg Subcutaneous Q24H  . insulin aspart  0-5 Units Subcutaneous QHS  . [START ON 10/30/2019] insulin aspart  0-9 Units Subcutaneous TID WC   Continuous: . lactated ringers     WCB:[id number] **OR** acetaminophen (TYLENOL) oral liquid 160 mg/5 mL **OR** acetaminophen, labetalol   ROS:                                                                                                                                       ROS was performed and is negative except as noted in HPI   General Examination:                                                                                                      Blood pressure (!) 166/101, pulse 88, temperature 98.4 F (36.9 C), resp. rate 19, height 5\' 8"  (1.727 m), weight 78.5 kg, SpO2 100 %.  Physical Exam  Constitutional: Appears well-developed and well-nourished.  Psych: Affect appropriate to situation Eyes: Normal external eye and conjunctiva. HENT: Normocephalic, no lesions, without obvious abnormality.   Musculoskeletal-no joint tenderness, deformity or swelling Cardiovascular: Normal rate and regular rhythm.  Respiratory: Effort normal, non-labored breathing saturations WNL GI: Soft.  No distension. There is no tenderness.  Skin: WDI  Neurological Examination Mental Status: Alert, oriented, thought content appropriate.  Speech fluent without evidence of aphasia.  Able to follow 3 step commands without difficulty. Cranial Nerves: II: Discs flat bilaterally; Visual fields grossly normal,  III,IV, VI: ptosis not present, extra-ocular motions intact bilaterally, pupils equal, round, reactive to light and accommodation V,VII: smile symmetric, facial light touch sensation normal  bilaterally VIII: hearing normal bilaterally IX,X: uvula rises midline XI: bilateral shoulder shrug XII: midline tongue extension Motor: Right : Upper extremity   5/5  Left:     Upper extremity   5/5  Lower extremity   5/5   Lower extremity   5/5 Tone and bulk:normal tone throughout; no atrophy noted Sensory: Pinprick and light touch intact throughout, bilaterally Deep Tendon Reflexes: 2+ and symmetric biceps and patella Plantars: Right: downgoing   Left: downgoing Cerebellar: normal finger-to-nose, normal rapid alternating movements and normal heel-to-shin test Gait: normal gait and  station   Lab Results: Basic Metabolic Panel: Recent Labs  Lab 10/29/19 0149  NA 139  K 3.9  CL 110  CO2 23  GLUCOSE 148*  BUN 18  CREATININE 1.53*  CALCIUM 8.3*    CBC: Recent Labs  Lab 10/29/19 0149  WBC 4.7  NEUTROABS 2.5  HGB 12.6*  HCT 39.1  MCV 84.1  PLT 154     CBG: Recent Labs  Lab 10/29/19 0820 10/29/19 1714  GLUCAP 97 117*    Imaging: CT Angio Head W or Wo Contrast  Result Date: 10/29/2019 CLINICAL DATA:  Intermittent right-sided numbness and weakness EXAM: CT ANGIOGRAPHY HEAD AND NECK TECHNIQUE: Multidetector CT imaging of the head and neck was performed using the standard protocol during bolus administration of intravenous contrast. Multiplanar CT image reconstructions and MIPs were obtained to evaluate the vascular anatomy. Carotid stenosis measurements (when applicable) are obtained utilizing NASCET criteria, using the distal internal carotid diameter as the denominator. CONTRAST:  22mL OMNIPAQUE IOHEXOL 350 MG/ML SOLN COMPARISON:  Head CT from earlier today FINDINGS: CT HEAD FINDINGS Brain: No evidence of acute infarction, hemorrhage, hydrocephalus, extra-axial collection or mass lesion/mass effect. Vascular: No hyperdense vessel or unexpected calcification. Skull: Normal. Negative for fracture or focal lesion. Sinuses: No acute finding Orbits: Negative Review of the  MIP images confirms the above findings CTA NECK FINDINGS Aortic arch: Atherosclerotic plaque at the arch. There is partial obscuration due to streak from intravenous contrast. Right carotid system: Low-density atheromatous plaque along the distal common carotid. Mild plaque at the bifurcation. No flow limiting stenosis or ulceration. Left carotid system: Mild atheromatous changes without stenosis or ulceration. Vertebral arteries: No proximal subclavian stenosis. 50% narrowing at the proximal right vertebral artery. No beading or flow limiting stenosis. Skeleton: No acute finding Other neck: Negative Upper chest: Negative Review of the MIP images confirms the above findings CTA HEAD FINDINGS Anterior circulation: Calcified plaque on the carotid siphons with severe left cavernous and supraclinoid ICA narrowing to the degree that the lumen is difficult to accurately measure. The left ICA is smaller than the right from hypoplastic left A1 segment and possibly from superimposed underfilling. No branch occlusion, beading, or aneurysm. There is a moderate right M2/3 segment stenosis best seen on sagittal reformats. Posterior circulation: Vertebral and basilar arteries are smooth and widely patent. High-grade left P2 segment narrowing, see sagittal reformats. Negative for aneurysm Venous sinuses: Diffusely patent Anatomic variants: As above Review of the MIP images confirms the above findings IMPRESSION: 1. 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 2. Advanced left P2 segment stenosis. 3. 50% right V1 segment stenosis. Electronically Signed   By: Monte Fantasia M.D.   On: 10/29/2019 06:32   CT Head Wo Contrast  Result Date: 10/28/2019 CLINICAL DATA:  Right side numbness EXAM: CT HEAD WITHOUT CONTRAST TECHNIQUE: Contiguous axial images were obtained from the base of the skull through the vertex without intravenous contrast. COMPARISON:  None. FINDINGS: Brain: No acute intracranial abnormality. Specifically,  no hemorrhage, hydrocephalus, mass lesion, acute infarction, or significant intracranial injury. Vascular: No hyperdense vessel or unexpected calcification. Skull: No acute calvarial abnormality. Sinuses/Orbits: Visualized paranasal sinuses and mastoids clear. Orbital soft tissues unremarkable. Other: None IMPRESSION: No acute intracranial abnormality. Electronically Signed   By: Rolm Baptise M.D.   On: 10/28/2019 22:36   CT Angio Neck W and/or Wo Contrast  Result Date: 10/29/2019 CLINICAL DATA:  Intermittent right-sided numbness and weakness EXAM: CT ANGIOGRAPHY HEAD AND NECK TECHNIQUE: Multidetector CT imaging of the head  and neck was performed using the standard protocol during bolus administration of intravenous contrast. Multiplanar CT image reconstructions and MIPs were obtained to evaluate the vascular anatomy. Carotid stenosis measurements (when applicable) are obtained utilizing NASCET criteria, using the distal internal carotid diameter as the denominator. CONTRAST:  18mL OMNIPAQUE IOHEXOL 350 MG/ML SOLN COMPARISON:  Head CT from earlier today FINDINGS: CT HEAD FINDINGS Brain: No evidence of acute infarction, hemorrhage, hydrocephalus, extra-axial collection or mass lesion/mass effect. Vascular: No hyperdense vessel or unexpected calcification. Skull: Normal. Negative for fracture or focal lesion. Sinuses: No acute finding Orbits: Negative Review of the MIP images confirms the above findings CTA NECK FINDINGS Aortic arch: Atherosclerotic plaque at the arch. There is partial obscuration due to streak from intravenous contrast. Right carotid system: Low-density atheromatous plaque along the distal common carotid. Mild plaque at the bifurcation. No flow limiting stenosis or ulceration. Left carotid system: Mild atheromatous changes without stenosis or ulceration. Vertebral arteries: No proximal subclavian stenosis. 50% narrowing at the proximal right vertebral artery. No beading or flow limiting stenosis.  Skeleton: No acute finding Other neck: Negative Upper chest: Negative Review of the MIP images confirms the above findings CTA HEAD FINDINGS Anterior circulation: Calcified plaque on the carotid siphons with severe left cavernous and supraclinoid ICA narrowing to the degree that the lumen is difficult to accurately measure. The left ICA is smaller than the right from hypoplastic left A1 segment and possibly from superimposed underfilling. No branch occlusion, beading, or aneurysm. There is a moderate right M2/3 segment stenosis best seen on sagittal reformats. Posterior circulation: Vertebral and basilar arteries are smooth and widely patent. High-grade left P2 segment narrowing, see sagittal reformats. Negative for aneurysm Venous sinuses: Diffusely patent Anatomic variants: As above Review of the MIP images confirms the above findings IMPRESSION: 1. 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 2. Advanced left P2 segment stenosis. 3. 50% right V1 segment stenosis. Electronically Signed   By: Monte Fantasia M.D.   On: 10/29/2019 06:32     Laurey Morale, MSN, NP-C [facility] [phone] 10/29/2019, 5:50 PM   Assessment: 59 y.o. male with a PMHx of HTN, DM and neuropathy who presented to [facility] with a chief complaint of intermittent right sided numbness, lightheadedness, and blurred vision.  1. Most likely component of the DDx is recurrent TIA 2. MRI is pending.  3. Patient was not a candidate for tPA due to NIHSS of 0.  4. CTA head/neck with evidence for multifocal intracranial and extracranial atherosclerotic disease.  5. Stroke Risk Factors - diabetes mellitus and hypertension   Recommendations: -- BP goal : Permissive HTN upto 220/110 mmHg (for 24-48 post admission then gradually normalize. -- Echocardiogram -- MRI brain -- Prophylactic therapy- DAPT with ASA and Plavix -- High intensity Statin - 80 mg atorvastatin po qd. Obtain baseline CK level.  -- HgbA1c, fasting lipid  panel -- PT consult, OT consult, Speech consult -- Telemetry monitoring -- Frequent neuro checks -- Stroke swallow screen  --Please page the Stroke team from 8am-4pm.   You can look them up on [email or web address]  I have seen and examined the patient. I have formulated the assessment and recommendations. 60 year old male with recurrent TIA. Exam is nonfocal. Recommendations include full stroke work up and initiation of DAPT.  Electronically signed: Dr. Kerney Elbe

## 2019-10-29 NOTE — ED Notes (Signed)
Pt encouraged to void

## 2019-10-29 NOTE — ED Notes (Signed)
Wallet given to daughter, kept cell phone,reading glasses and pants

## 2019-10-29 NOTE — ED Notes (Signed)
Report called to Musician, receiving nurse at [facility]

## 2019-10-29 NOTE — Plan of Care (Signed)
  Problem: Education: Goal: Knowledge of General Education information will improve Description: Including pain rating scale, medication(s)/side effects and non-pharmacologic comfort measures Outcome: Progressing   Problem: Health Behavior/Discharge Planning: Goal: Ability to manage health-related needs will improve Outcome: Progressing  Stroke book given and FAST symptoms explained Problem: Activity: Goal: Risk for activity intolerance will decrease Outcome: Progressing   Problem: Nutrition: Goal: Adequate nutrition will be maintained Outcome: Progressing   Problem: Coping: Goal: Level of anxiety will decrease Outcome: Progressing   Problem: Elimination: Goal: Will not experience complications related to bowel motility Outcome: Progressing Goal: Will not experience complications related to urinary retention Outcome: Progressing   Problem: Pain Managment: Goal: General experience of comfort will improve Outcome: Progressing   Problem: Safety: Goal: Ability to remain free from injury will improve Outcome: Progressing   Problem: Skin Integrity: Goal: Risk for impaired skin integrity will decrease Outcome: Progressing

## 2019-10-29 NOTE — ED Notes (Signed)
Pt given two frozen dinners mac n cheese & [place] stuffing dinner and a diet ginger ale drink.

## 2019-10-29 NOTE — ED Notes (Signed)
PT to CT.

## 2019-10-30 ENCOUNTER — Observation Stay (HOSPITAL_COMMUNITY): Payer: No Typology Code available for payment source

## 2019-10-30 ENCOUNTER — Inpatient Hospital Stay (HOSPITAL_COMMUNITY): Payer: No Typology Code available for payment source

## 2019-10-30 DIAGNOSIS — I160 Hypertensive urgency: Secondary | ICD-10-CM | POA: Diagnosis present

## 2019-10-30 DIAGNOSIS — Z20822 Contact with and (suspected) exposure to covid-19: Secondary | ICD-10-CM | POA: Diagnosis present

## 2019-10-30 DIAGNOSIS — R29700 NIHSS score 0: Secondary | ICD-10-CM | POA: Diagnosis present

## 2019-10-30 DIAGNOSIS — R609 Edema, unspecified: Secondary | ICD-10-CM

## 2019-10-30 DIAGNOSIS — I639 Cerebral infarction, unspecified: Secondary | ICD-10-CM | POA: Diagnosis present

## 2019-10-30 DIAGNOSIS — I6340 Cerebral infarction due to embolism of unspecified cerebral artery: Secondary | ICD-10-CM | POA: Diagnosis present

## 2019-10-30 DIAGNOSIS — Z23 Encounter for immunization: Secondary | ICD-10-CM | POA: Diagnosis not present

## 2019-10-30 DIAGNOSIS — R200 Anesthesia of skin: Secondary | ICD-10-CM | POA: Diagnosis not present

## 2019-10-30 DIAGNOSIS — E1142 Type 2 diabetes mellitus with diabetic polyneuropathy: Secondary | ICD-10-CM | POA: Diagnosis present

## 2019-10-30 DIAGNOSIS — D631 Anemia in chronic kidney disease: Secondary | ICD-10-CM | POA: Diagnosis present

## 2019-10-30 DIAGNOSIS — I6389 Other cerebral infarction: Secondary | ICD-10-CM | POA: Diagnosis not present

## 2019-10-30 DIAGNOSIS — G459 Transient cerebral ischemic attack, unspecified: Secondary | ICD-10-CM | POA: Diagnosis present

## 2019-10-30 DIAGNOSIS — E1122 Type 2 diabetes mellitus with diabetic chronic kidney disease: Secondary | ICD-10-CM | POA: Diagnosis present

## 2019-10-30 DIAGNOSIS — H538 Other visual disturbances: Secondary | ICD-10-CM | POA: Diagnosis present

## 2019-10-30 DIAGNOSIS — E1159 Type 2 diabetes mellitus with other circulatory complications: Secondary | ICD-10-CM | POA: Diagnosis not present

## 2019-10-30 DIAGNOSIS — I129 Hypertensive chronic kidney disease with stage 1 through stage 4 chronic kidney disease, or unspecified chronic kidney disease: Secondary | ICD-10-CM | POA: Diagnosis present

## 2019-10-30 DIAGNOSIS — Z7984 Long term (current) use of oral hypoglycemic drugs: Secondary | ICD-10-CM | POA: Diagnosis not present

## 2019-10-30 DIAGNOSIS — E782 Mixed hyperlipidemia: Secondary | ICD-10-CM | POA: Diagnosis not present

## 2019-10-30 DIAGNOSIS — G8191 Hemiplegia, unspecified affecting right dominant side: Secondary | ICD-10-CM | POA: Diagnosis present

## 2019-10-30 DIAGNOSIS — I739 Peripheral vascular disease, unspecified: Secondary | ICD-10-CM

## 2019-10-30 DIAGNOSIS — E785 Hyperlipidemia, unspecified: Secondary | ICD-10-CM | POA: Diagnosis present

## 2019-10-30 DIAGNOSIS — I771 Stricture of artery: Secondary | ICD-10-CM | POA: Diagnosis present

## 2019-10-30 DIAGNOSIS — N1832 Chronic kidney disease, stage 3b: Secondary | ICD-10-CM

## 2019-10-30 DIAGNOSIS — N189 Chronic kidney disease, unspecified: Secondary | ICD-10-CM | POA: Diagnosis present

## 2019-10-30 HISTORY — DX: Cerebral infarction, unspecified: I63.9

## 2019-10-30 LAB — COMPREHENSIVE METABOLIC PANEL
ALT: 16 U/L (ref 0–44)
AST: 19 U/L (ref 15–41)
Albumin: 1.9 g/dL — ABNORMAL LOW (ref 3.5–5.0)
Alkaline Phosphatase: 78 U/L (ref 38–126)
Anion gap: 8 (ref 5–15)
BUN: 16 mg/dL (ref 6–20)
CO2: 21 mmol/L — ABNORMAL LOW (ref 22–32)
Calcium: 8.2 mg/dL — ABNORMAL LOW (ref 8.9–10.3)
Chloride: 110 mmol/L (ref 98–111)
Creatinine, Ser: 1.48 mg/dL — ABNORMAL HIGH (ref 0.61–1.24)
GFR calc Af Amer: 59 mL/min — ABNORMAL LOW (ref >60)
GFR calc non Af Amer: 51 mL/min — ABNORMAL LOW (ref >60)
Glucose, Bld: 108 mg/dL — ABNORMAL HIGH (ref 70–99)
Potassium: 3.8 mmol/L (ref 3.5–5.1)
Sodium: 139 mmol/L (ref 135–145)
Total Bilirubin: 0.2 mg/dL — ABNORMAL LOW (ref 0.3–1.2)
Total Protein: 4.7 g/dL — ABNORMAL LOW (ref 6.5–8.1)

## 2019-10-30 LAB — CBC
HCT: 34.4 % — ABNORMAL LOW (ref 39.0–52.0)
Hemoglobin: 11.4 g/dL — ABNORMAL LOW (ref 13.0–17.0)
MCH: 27.4 pg (ref 26.0–34.0)
MCHC: 33.1 g/dL (ref 30.0–36.0)
MCV: 82.7 fL (ref 80.0–100.0)
Platelets: 144 10*3/uL — ABNORMAL LOW (ref 150–400)
RBC: 4.16 MIL/uL — ABNORMAL LOW (ref 4.22–5.81)
RDW: 12.8 % (ref 11.5–15.5)
WBC: 3.5 10*3/uL — ABNORMAL LOW (ref 4.0–10.5)
nRBC: 0 % (ref 0.0–0.2)

## 2019-10-30 LAB — HEMOGLOBIN A1C
Hgb A1c MFr Bld: 6.9 % — ABNORMAL HIGH (ref 4.8–5.6)
Mean Plasma Glucose: 151.33 mg/dL

## 2019-10-30 LAB — CK: Total CK: 180 U/L (ref 49–397)

## 2019-10-30 LAB — LIPID PANEL
Cholesterol: 315 mg/dL — ABNORMAL HIGH (ref 0–200)
HDL: 52 mg/dL (ref >40)
LDL Cholesterol: 230 mg/dL — ABNORMAL HIGH (ref 0–99)
Total CHOL/HDL Ratio: 6.1 RATIO
Triglycerides: 165 mg/dL — ABNORMAL HIGH (ref <150)
VLDL: 33 mg/dL (ref 0–40)

## 2019-10-30 LAB — GLUCOSE, CAPILLARY
Glucose-Capillary: 99 mg/dL (ref 70–99)
Glucose-Capillary: 159 mg/dL — ABNORMAL HIGH (ref 70–99)
Glucose-Capillary: 81 mg/dL (ref 70–99)
Glucose-Capillary: 98 mg/dL (ref 70–99)

## 2019-10-30 LAB — CREATININE, SERUM
Creatinine, Ser: 1.42 mg/dL — ABNORMAL HIGH (ref 0.61–1.24)
GFR calc Af Amer: >60 mL/min (ref >60)
GFR calc non Af Amer: 54 mL/min — ABNORMAL LOW (ref >60)

## 2019-10-30 LAB — HIV ANTIBODY (ROUTINE TESTING W REFLEX): HIV Screen 4th Generation wRfx: NONREACTIVE

## 2019-10-30 LAB — ECHOCARDIOGRAM COMPLETE

## 2019-10-30 MED ORDER — ASPIRIN EC 325 MG PO TBEC
325.0000 mg | DELAYED_RELEASE_TABLET | Freq: Every day | ORAL | Status: DC
  Administered 2019-10-30 – 2019-10-31 (×2): 325 mg via ORAL
  Filled 2019-10-30 (×2): qty 1

## 2019-10-30 MED ORDER — ATORVASTATIN CALCIUM 80 MG PO TABS
80.0000 mg | ORAL_TABLET | Freq: Every day | ORAL | Status: DC
  Administered 2019-10-30 – 2019-10-31 (×2): 80 mg via ORAL
  Filled 2019-10-30 (×2): qty 1

## 2019-10-30 NOTE — Progress Notes (Addendum)
VASCULAR LAB PRELIMINARY  PRELIMINARY  PRELIMINARY  PRELIMINARY  Bilateral lower extremity venous duplex completed.    Preliminary report:  See CV proc for preliminary results.   Messaged Dr. San Jetty, [facility], RVT 10/30/2019, 8:55 AM

## 2019-10-30 NOTE — Consult Note (Signed)
Referring Physician: Dr Florene Glen  Patient name: Tayvian Holycross MRN: [id number] DOB: May 02, 1961 Sex: male  REASON FOR CONSULT: left SFA occlusion  HPI: Roderic Lammert is a 59 y.o. male, admitted with acute stroke.  Was noted to have incidental finding of left SFA occlusion on duplex to rule out DVT.  Pt has a several year history of tightness in his left calf that occurs with walking and resolves after about 5 minutes.  He has no rest pain or non healing wounds.  He does have some occasional numbness and tingling in the feet.  He has a history of DM for about 20 years.  He also has history of peripheral neuropathy.  He has no tobacco abuse history.  He has no history of afib.  His stroke symptoms have mostly resolved and involved right side intermittent numbness.  Work up showed acute bilateral stroke, intracranial vessel disease no carotid stenosis. Plan is for plavix ASA statin.  Past Medical History:  Diagnosis Date   Diabetes mellitus without complication ([place])    Neuropathy    History reviewed. No pertinent surgical history.  No family history on file.  SOCIAL HISTORY: Social History   Socioeconomic History   Marital status: Married    Spouse name: Not on file   Number of children: Not on file   Years of education: Not on file   Highest education level: Not on file  Occupational History   Not on file  Tobacco Use   Smoking status: Never Smoker   Smokeless tobacco: Never Used  Substance and Sexual Activity   Alcohol use: No   Drug use: No   Sexual activity: Not on file  Other Topics Concern   Not on file  Social History Narrative   Not on file   Social Determinants of Health   Financial Resource Strain:    Difficulty of Paying Living Expenses:   Food Insecurity:    Worried About Charity fundraiser in the Last Year:    Arboriculturist in the Last Year:   Transportation Needs:    Film/video editor (Medical):    Lack of Transportation  (Non-Medical):   Physical Activity:    Days of Exercise per Week:    Minutes of Exercise per Session:   Stress:    Feeling of Stress :   Social Connections:    Frequency of Communication with Friends and Family:    Frequency of Social Gatherings with Friends and Family:    Attends Religious Services:    Active Member of Clubs or Organizations:    Attends Music therapist:    Marital Status:   Intimate Partner Violence:    Fear of Current or Ex-Partner:    Emotionally Abused:    Physically Abused:    Sexually Abused:     No Known Allergies  Current Facility-Administered Medications  Medication Dose Route Frequency Provider Last Rate Last Admin   acetaminophen (TYLENOL) tablet 650 mg  650 mg Oral Q4H PRN Elodia Florence., MD       Or   acetaminophen (TYLENOL) 160 MG/5ML solution 650 mg  650 mg Per Tube Q4H PRN Elodia Florence., MD       Or   acetaminophen (TYLENOL) suppository 650 mg  650 mg Rectal Q4H PRN Elodia Florence., MD       aspirin EC tablet 325 mg  325 mg Oral Daily Rosalin Hawking, MD   325 mg at 10/30/19  0933   atorvastatin (LIPITOR) tablet 80 mg  80 mg Oral Daily Elodia Florence., MD   80 mg at 10/30/19 3382   clopidogrel (PLAVIX) tablet 75 mg  75 mg Oral Daily Elodia Florence., MD   75 mg at 10/30/19 0933   enoxaparin (LOVENOX) injection 40 mg  40 mg Subcutaneous Q24H Elodia Florence., MD   40 mg at 10/29/19 1856   feeding supplement (ENSURE ENLIVE) (ENSURE ENLIVE) liquid 237 mL  237 mL Oral BID BM Elodia Florence., MD   237 mL at 10/30/19 0933   insulin aspart (novoLOG) injection 0-5 Units  0-5 Units Subcutaneous QHS Elodia Florence., MD       insulin aspart (novoLOG) injection 0-9 Units  0-9 Units Subcutaneous TID WC Elodia Florence., MD       labetalol (NORMODYNE) injection 10 mg  10 mg Intravenous Q2H PRN Elodia Florence., MD       lactated ringers infusion   Intravenous  Continuous Elodia Florence., MD 100 mL/hr at 10/30/19 0609 New Bag at 10/30/19 0609   pneumococcal 23 valent vaccine (PNEUMOVAX-23) injection 0.5 mL  0.5 mL Intramuscular Tomorrow-1000 Elodia Florence., MD        ROS:   General:  No weight loss, Fever, chills  HEENT: No recent headaches, no nasal bleeding, no visual changes, no sore throat  Neurologic: No dizziness, blackouts, seizures. No recent symptoms of stroke or mini- stroke. No recent episodes of slurred speech, or temporary blindness.  Cardiac: No recent episodes of chest pain/pressure, no shortness of breath at rest.  No shortness of breath with exertion.  Denies history of atrial fibrillation or irregular heartbeat  Vascular: No history of rest pain in feet.  No history of claudication.  No history of non-healing ulcer, No history of DVT   Pulmonary: No home oxygen, no productive cough, no hemoptysis,  No asthma or wheezing  Musculoskeletal:  [ ]  Arthritis, [ ]  Low back pain,  [ ]  Joint pain  Hematologic:No history of hypercoagulable state.  No history of easy bleeding.  No history of anemia  Gastrointestinal: No hematochezia or melena,  No gastroesophageal reflux, no trouble swallowing  Urinary: [ ]  chronic Kidney disease, [ ]  on HD - [ ]  MWF or [ ]  TTHS, [ ]  Burning with urination, [ ]  Frequent urination, [ ]  Difficulty urinating;   Skin: No rashes  Psychological: No history of anxiety,  No history of depression   Physical Examination  Vitals:   10/30/19 0000 10/30/19 0400 10/30/19 0411 10/30/19 0742  BP: (!) 172/96 (!) 185/99 (!) 190/100 (!) 160/97  Pulse: 75 83  79  Resp: 20 20  16   Temp: (!) 97.2 F (36.2 C) 98.3 F (36.8 C)  97.6 F (36.4 C)  TempSrc:  Oral    SpO2: 99% 99%  100%  Weight:      Height:        Body mass index is 26.3 kg/m.  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  Will get ABIs in hospital to establish baseline  Will arrange outpt follow up in a few months.  Call if questions.   Ruta Hinds, MD [facility] Office: [phone] Pager: [phone]

## 2019-10-30 NOTE — Evaluation (Addendum)
Speech Language Pathology Evaluation Patient Details Name: Douglas Briggs MRN: [id number] DOB: 08/25/1960 Today's Date: 10/30/2019 Time: 1115-5208 SLP Time Calculation (min) (ACUTE ONLY): 33 min  Problem List:  Patient Active Problem List   Diagnosis Date Noted  . TIA (transient ischemic attack) 10/29/2019  . Stroke-like symptoms 10/29/2019  . Right sided numbness    Past Medical History:  Past Medical History:  Diagnosis Date  . Diabetes mellitus without complication ([place])   . Neuropathy    Past Surgical History: History reviewed. No pertinent surgical history. HPI:  59 yo male presenting with R sided numbness and blurry vision intermittent over past several days. MRI of head showing subacute R frontal what matter infarct and subacute infarcts within the right frontal operculum, left temporoparietal periventricular white matter, right frontoparietal white matter and within the midline cerebellum. PMH including HTN and DM. `   Assessment / Plan / Recommendation Clinical Impression  Pt was seen for a cognitive-linguistic evaluation in the setting of acute infarcts and he presents with a mild cognitive impairment c/b deficits in short-term memory and numeric problem solving.  Suspect that some deficits may be baseline.  Family was not present to help determine a cognitive-linguistic baseline.  Pt completed the [facility] Cognitive Assessment [facility]) in addition to informal measures and he scored overall 21/30 on the [facility] (norm is >/=36/30).  Pt reported that he lives with his mother and that they are both independent with all ADLs and IADLs.  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  Expressive and receptive language were functional and no dysarthria was observed.  Recommend initial supervision with IADLs (medications, finances, etc.) when pt is first discharged.  SLP will f/u acutely for cognitive tx, but no additional follow up is currently recommended  at time of discharge.  If patient has difficulty with IADLs or with maintaining his business after discharge, recommend outpatient speech therapy targeting mild cognitive deficits.  Spoke with pt regarding all recommendations and he verbalized understanding.     SLP Assessment  SLP Recommendation/Assessment: Patient needs continued [facility] SLP Visit Diagnosis: Cognitive communication deficit (R41.841)    Follow Up Recommendations  None    Frequency and Duration min 1 x/week  2 weeks      SLP Evaluation Cognition  Overall Cognitive Status: Impaired/Different from baseline Arousal/Alertness: Awake/alert Orientation Level: Oriented X4 Attention: Focused;Sustained Focused Attention: Appears intact Sustained Attention: Appears intact Memory: Impaired Memory Impairment: Decreased short term memory Decreased Short Term Memory: Verbal basic Awareness: Appears intact Problem Solving: Impaired Problem Solving Impairment: Verbal basic Executive Function: Organizing Organizing: Impaired Organizing Impairment: Functional complex Safety/Judgment: Appears intact       Comprehension  Auditory Comprehension Overall Auditory Comprehension: Appears within functional limits for tasks assessed    Expression Expression Primary Mode of Expression: Verbal Verbal Expression Overall Verbal Expression: Appears within functional limits for tasks assessed Written Expression Dominant Hand: Right   Oral / Motor  Oral Motor/Sensory Function Overall Oral Motor/Sensory Function: Within functional limits Motor Speech Overall Motor Speech: Appears within functional limits for tasks assessed   GO          Douglas Briggs M.S., [place] [facility] Office: [phone]         Douglas Briggs [facility] 10/30/2019, 10:32 AM

## 2019-10-30 NOTE — Progress Notes (Signed)
  Echocardiogram 2D Echocardiogram has been performed.  Douglas Briggs 10/30/2019, 9:17 AM

## 2019-10-30 NOTE — Progress Notes (Signed)
VASCULAR LAB PRELIMINARY  PRELIMINARY  PRELIMINARY  PRELIMINARY  ABIs completed.    Preliminary report:  See CV proc for preliminary results.  Aracelys Glade, RVT 10/30/2019, 8:00 PM

## 2019-10-30 NOTE — Progress Notes (Signed)
Called back by Dr. Florene Glen that pt complained of right hand numbness lasting 2 min. Now resolved. Not sure if this is central event or peripheral event, pt does have DM neuropathy and PVD. Do not think this will change current management. Continue recommend DAPT and statin. Will arrange outpt TEE and loop recorder as previous planned. Dr. Florene Glen will keep the pt overnight for further monitoring, if frequent episodes, will consider EEG or repeat MRI. Dr. Leonie Man will continue to follow up tomorrow.   Rosalin Hawking, MD PhD Stroke Neurology 10/30/2019 4:00 PM

## 2019-10-30 NOTE — Evaluation (Signed)
Occupational Therapy Evaluation Patient Details Name: Douglas Briggs MRN: [id number] DOB: 04/04/1961 Today's Date: 10/30/2019    History of Present Illness 59 yo male presenting with R sided numbness and blurry vision intermittent over past several days. MRI of head showing subacute R frontal what matter infarct and subacute infarcts within the right frontal operculum, left temporoparietal periventricular white matter, right frontoparietal white matter and within the midline cerebellum. PMH incuding HTN and DM.    Clinical Impression   PTA, pt was living with his mother and was independent and working. Pt currently performing ADLs and functional mobility at Mod I level with increased time; feel pt is close to baseline for ADLs. During trail making tasks, pt able to locate "room 12" without cues, but upon returning to his room pt requiring Mod cues for use of signs and to attend to room signs in locating his room. Pt would benefit from further acute OT to facilitate safe dc and further assess cognition. Recommend dc to home once medically stable per physician.     Follow Up Recommendations  No OT follow up;Supervision - Intermittent    Equipment Recommendations  None recommended by OT    Recommendations for Other Services Speech consult     Precautions / Restrictions Precautions Precautions: None      Mobility Bed Mobility Overal bed mobility: Independent                Transfers Overall transfer level: Modified independent                    Balance Overall balance assessment: No apparent balance deficits (not formally assessed)                                         ADL either performed or assessed with clinical judgement   ADL Overall ADL's : Modified independent                                       General ADL Comments: Pt performing ADLs and functional mobility near baseline functional with slight increased in time for  processing.      Vision Baseline Vision/History: Wears glasses Wears Glasses: Reading only Patient Visual Report: Blurring of vision Vision Assessment?: Yes Eye Alignment: Within Functional Limits Ocular Range of Motion: Within Functional Limits Alignment/Gaze Preference: Within Defined Limits Additional Comments: "Film over my eyes or something." Able to reach from stoke booklet, but small letters difficult as he repeats that everything is blurry as if there is a film over everything     Perception     Praxis      Pertinent Vitals/Pain Pain Assessment: 0-10 Pain Score: 2  Pain Location: Foot and back Pain Descriptors / Indicators: Discomfort Pain Intervention(s): Monitored during session;Limited activity within patient's tolerance;Repositioned     Hand Dominance Right   Extremity/Trunk Assessment Upper Extremity Assessment Upper Extremity Assessment: Overall WFL for tasks assessed;RUE deficits/detail RUE Deficits / Details: Reported numbness at right hand which will come and go. RUE Sensation: (numbness)   Lower Extremity Assessment Lower Extremity Assessment: Defer to PT evaluation   Cervical / Trunk Assessment Cervical / Trunk Assessment: Normal   Communication Communication Communication: No difficulties   Cognition Arousal/Alertness: Awake/alert Behavior During Therapy: WFL for tasks assessed/performed Overall Cognitive Status: Impaired/Different from baseline  Area of Impairment: Problem solving                             Problem Solving: Slow processing;Requires verbal cues General Comments: Pt performing BADLs and functional mobility near baseline function. With executifce functioning and cognitive tasks; pt showing difficulties. Pt unable to name any animal that started with an F despite cues. Pt able to locate room 12 with use of signs, but then becoming lost when attempting to locate his room. Will need to continue to assess.   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 [employer] Problem List: Decreased cognition;Decreased knowledge of precautions;Decreased activity tolerance      OT Treatment/Interventions: Self-care/ADL training;Therapeutic exercise;Cognitive remediation/compensation;Therapeutic activities;Patient/family education    OT Goals(Current goals can be found in the care plan section) Acute Rehab OT Goals Patient Stated Goal: Go home OT Goal Formulation: With patient Time For Goal Achievement: 11/13/19 Potential to Achieve Goals: Good  OT Frequency: Min 2X/week   Barriers to D/C:            Co-evaluation              AM-PAC OT "6 Clicks" Daily Activity     Outcome Measure Help from another person eating meals?: None Help from another person taking care of personal grooming?: None Help from another person toileting, which includes using toliet, bedpan, or urinal?: None Help from another person bathing (including washing, rinsing, drying)?: None Help from another person to put on and taking off regular upper body clothing?: None Help from another person to put on and taking off regular lower body clothing?: None 6 Click Score: 24   End of Session Nurse Communication: Mobility status  Activity Tolerance: Patient tolerated treatment well Patient left: in bed;with call bell/phone within reach(with transport team)  OT Visit Diagnosis: Other symptoms and signs involving cognitive function                 Time: 8756-4332 OT Time Calculation (min): 22 min Charges:  OT General Charges $OT Visit: 1 Visit OT Evaluation $OT Eval Moderate Complexity: 1 Mod  [place], OTR/L Acute Rehab Pager: [phone] Office: [place] 10/30/2019, 8:35 AM

## 2019-10-30 NOTE — Progress Notes (Signed)
STROKE TEAM PROGRESS NOTE   INTERVAL HISTORY His wife is at the bedside.  Pt sitting in bed, awake alert, talkative and making jokes with me. Neuro intact, he was able to walk with PT in the hallway later without difficulty. However, he was found to have high LDL, CKD, and uncontrolled HTN. He denies smoking and UDS neg. No DVT but found to have left SFA occlusion with distal recon. Vascular surgery consulted. Pt denies heart palpitation but his stroke is embolic pattern, will need outpt TEE and loop.  OBJECTIVE Vitals:   10/30/19 0000 10/30/19 0400 10/30/19 0411 10/30/19 0742  BP: (!) 172/96 (!) 185/99 (!) 190/100 (!) 160/97  Pulse: 75 83  79  Resp: 20 20  16   Temp: (!) 97.2 F (36.2 C) 98.3 F (36.8 C)  97.6 F (36.4 C)  TempSrc:  Oral    SpO2: 99% 99%  100%  Weight:      Height:        CBC:  Recent Labs  Lab 10/29/19 0149 10/30/19 0350  WBC 4.7 3.5*  NEUTROABS 2.5  --   HGB 12.6* 11.4*  HCT 39.1 34.4*  MCV 84.1 82.7  PLT 154 144*    Basic Metabolic Panel:  Recent Labs  Lab 10/29/19 0149 10/29/19 0149 10/30/19 0350 10/30/19 0828  NA 139  --   --  139  K 3.9  --   --  3.8  CL 110  --   --  110  CO2 23  --   --  21*  GLUCOSE 148*  --   --  108*  BUN 18  --   --  16  CREATININE 1.53*   < > 1.42* 1.48*  CALCIUM 8.3*  --   --  8.2*   < > = values in this interval not displayed.    Lipid Panel:     Component Value Date/Time   CHOL 315 (H) 10/30/2019 0350   TRIG 165 (H) 10/30/2019 0350   HDL 52 10/30/2019 0350   CHOLHDL 6.1 10/30/2019 0350   VLDL 33 10/30/2019 0350   LDLCALC 230 (H) 10/30/2019 0350   HgbA1c:  Lab Results  Component Value Date   HGBA1C 6.9 (H) 10/30/2019   Urine Drug Screen:     Component Value Date/Time   LABOPIA NONE DETECTED 10/29/2019 0527   COCAINSCRNUR NONE DETECTED 10/29/2019 0527   LABBENZ NONE DETECTED 10/29/2019 0527   AMPHETMU NONE DETECTED 10/29/2019 0527   THCU NONE DETECTED 10/29/2019 0527   LABBARB NONE DETECTED  10/29/2019 0527    Alcohol Level     Component Value Date/Time   ETH <10 10/29/2019 0149    IMAGING  CT Angio Head W or Wo Contrast CT Angio Neck W and/or Wo Contrast 10/29/2019 IMPRESSION:  1. 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  2. Advanced left P2 segment stenosis.  3. 50% right V1 segment stenosis.   CT Head Wo Contrast 10/28/2019 IMPRESSION:  No acute intracranial abnormality.   MR BRAIN WO CONTRAST 10/29/2019 IMPRESSION:  Motion degraded examination as described. 10 mm late subacute infarct within the right frontal white matter. Several additional subcentimeter acute/early subacute infarcts within the right frontal operculum, left temporoparietal periventricular white matter, right frontoparietal white matter and within the midline cerebellum as described. Background mild chronic small vessel ischemic changes within the cerebral white matter. Chronic thalamic and right cerebellar lacunar infarcts. Mild ethmoid and maxillary sinus mucosal thickening.   DG Chest 1 View 10/29/2019 IMPRESSION:  No active  disease.  VAS [place] LOWER EXTREMITY VENOUS (DVT) 10/30/2019 Summary:  BILATERAL: - No evidence of deep vein thrombosis seen in the lower extremities, bilaterally. 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 Cannot rule out thrombus. Interstitial fluid noted throughout calf.    ECG - SR rate 72 BPM. (See cardiology reading for complete details)   PHYSICAL EXAM  Temp:  [97.2 F (36.2 C)-98.4 F (36.9 C)] 97.6 F (36.4 C) (06/05 0742) Pulse Rate:  [75-97] 79 (06/05 0742) Resp:  [14-20] 16 (06/05 0742) BP: (144-190)/(78-101) 160/97 (06/05 0742) SpO2:  [99 %-100 %] 100 % (06/05 0742)  General - Well nourished, well developed, in no apparent distress.  Ophthalmologic - fundi not visualized due to  noncooperation.  Cardiovascular - Regular rhythm and rate.  Mental Status -  Level of arousal and orientation to time, place, and person were intact. Language including expression, naming, repetition, comprehension was assessed and found intact. Attention span and concentration were normal. Fund of Knowledge was assessed and was intact.  Cranial Nerves II - XII - II - Visual field intact OU. III, IV, VI - Extraocular movements intact. V - Facial sensation intact bilaterally. VII - Facial movement intact bilaterally. VIII - Hearing & vestibular intact bilaterally. X - Palate elevates symmetrically. XI - Chin turning & shoulder shrug intact bilaterally. XII - Tongue protrusion intact.  Motor Strength - The patient's strength was normal in all extremities and pronator drift was absent.  Bulk was normal and fasciculations were absent.   Motor Tone - Muscle tone was assessed at the neck and appendages and was normal.  Reflexes - The patient's reflexes were symmetrical in all extremities and he had no pathological reflexes.  Sensory - Light touch, temperature/pinprick were assessed and were symmetrical.    Coordination - The patient had normal movements in the hands and feet with no ataxia or dysmetria.  Tremor was absent.  Gait and Station - deferred.   ASSESSMENT/PLAN [name] is a 59 y.o. male with history of HTN, DM and neuropathy with no regular medical f/u who presented to [facility] with a chief complaint of intermittent right sided numbness, blurred vision lightheadedness, word finding difficulty and weakness of the BLE during these episodes over the past several days.  He did not receive IV t-PA due to resolution of deficits.  Stroke: multiple bilateral infarcts - embolic - source unclear  CT head - No acute intracranial abnormality.   MRI head - 10 mm late subacute infarct within the right frontal white matter. Several additional subcentimeter acute/early subacute infarcts  within the right frontal operculum, left temporoparietal periventricular white matter, right frontoparietal white matter and within the midline cerebellum as described. Background mild chronic small vessel ischemic changes within the cerebral white matter. Chronic thalamic and right cerebellar lacunar infarcts.   CTA H&N - Severe left cavernous and supraclinoid ICA stenosis. Advanced left P2 segment stenosis. 50% right V1 segment stenosis.  Bilateral Lower Extremity Venous Dopplers - no DVT bilaterally  2D Echo - pending  Will arrange for outpt TEE and loop with cardiology  Lacey Jensen Virus 2 - negative  LDL - 230  HgbA1c - 6.9  UDS - negative  VTE prophylaxis - Lovenox  No antithrombotic prior to admission, now on aspirin 325 mg daily and clopidogrel 75 mg daily DAPT for 3 months and then ASA alone.  Patient counseled to be compliant with his antithrombotic medications  Ongoing aggressive stroke risk factor management  Therapy recommendations:  None  Disposition:  Pending, likely home  PVD  LE venous doppler - incidental finding of left SFA occlusion with distal recon  Vascular surgery consulted  ABI pending  Follow up with [facility] as outpt  Intracranial stenosis  CTA head and neck - left ICA siphon high grade stenosis  Asymptomatic this time  On DAPT for 3 months and then ASA alone  On high dose lipitor  Will maximize medication treatment first, if symptomatic stroke in the future, will consider stenting.   Hypertensive urgency, uncontrolled HTN  Home BP meds: none   Current BP meds: none   Blood pressure on the high end, 200s on presentation . Gradually normalize in 2-3 days  . Long-term BP goal 130-150 given severe stenosis of left ICA siphon  Hyperlipidemia  Home Lipid lowering medication: none   LDL 230, goal < 70  Current lipid lowering medication: Lipitor 80 mg daily  Continue statin at discharge  Diabetes  Home diabetic meds:  metformin  Current diabetic meds: insulin  HgbA1c 6.9, goal < 7.0  CBG monitoring  Close PCP follow up  CKD  Stage IIIb  Cre 1.53->1.48  Other Stroke Risk Factors  Previous strokes by MRI   Other Active Problems  Code status - Full code Mild anemia due to CKD  Hospital day # 0  Neurology will sign off. Please call with questions. Pt will follow up with stroke clinic NP at [facility] in about 4 weeks. Thanks for the consult.  Rosalin Hawking, MD PhD Stroke Neurology 10/30/2019 1:05 PM  To contact Stroke Continuity provider, please refer to [email or web address] After hours, contact General Neurology

## 2019-10-30 NOTE — Progress Notes (Signed)
PROGRESS NOTE    Douglas Briggs  IOX:[id number] DOB: [birth year] DOA: 10/29/2019 PCP: Rubie Maid, MD  Chief Complaint  Patient presents with  . Numbness   Brief Narrative:  Douglas Briggs is Douglas Briggs 59 y.o. male with medical history significant of hypertension and diabetes, but has not seen [facility] physician in several years, presents with R sided numbness and blurry vision intermittent over the past several days.  He notes his symptoms started several days ago.  He began having R sided numbness that was intermittent.  Numbness to the R side of face, R arm, and R leg.  He also noted intermittent blurry vision and lightheadedness.  He also noted some word finding difficulties.  He denied any slurred speech or facial droop to me.  He did note weakness during these episodes, he says the weakness was to bilateral lower extremities.  He noted several episodes over the past several days.  He came to the ED yesterday, but wasn't able to be seen so he returned today.  His symptoms are currently resolved.  No CP, SOB, abdominal pain, N/V.  No fevers.  He notes swelling to his legs over past several weeks.  He doesn't smoke or use drugs.  Occasional etoh use.  Denies fh of heart attack or stroke.    ED Course: Labs, EKG, imaging.  Teleneurology c/s, recommended CTA head and neck, MRI brain.  Start ASA/plavix.  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, advanced P2 segment stenosis, 50% right V1 segment stenosis MRI with 10 mm late subacute infarct within the R frontal white matter and several additional subcentimeter acute/early subacute infarcts within the R frontal operculum, L  temporoparietal periventricular white matter, R frontoparietal white matter, and within the midline cerebellum - chronic thalamic and R cerebellar lacunar infarcs, background mild chronic small vessel ischemic changes within the cerebral white matter (see report) Neurology c/s, concern for embolic stroke, unclear source - they'll arrange outpatient TEE and loop recorder with cardiology Recommending DAPT x 3 months and then aspirin alone Regarding intracranial stenosis, neurology recommending maximizing medical treatment first, then considering stenting A1c 6.9, LDL 230 PT/OT/SLP Permissive hypertension for now Echo with EF 26-%%%, grade 1 diastolic dysfunction Continue telemetry Recurrent numbness to R hand, resolved within minutes, discussed with Dr. 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 Appreciate vascular surgery -> planning to get ABI's and then follow outpatient in [place] few months  T2DM on metformin at home Hold metformin, SSI Follow A1c, lipid panel  Hypertension:  Permissive hypertension for now  Elevated Creatinine:  Unclear if CKD vs AKI, follow with IVF Follow UP/C - pending Suspect CKD  DVT prophylaxis: sinus Code Status: full Family Communication: none  Disposition:   Status is: Observation  The patient will require care spanning > 2 midnights and should be moved to inpatient because: Inpatient level of care appropriate due to severity of illness  Dispo: The patient is from: Home  Anticipated d/c is to: Home              Anticipated d/c date is: 1 day              Patient currently is not medically stable to d/c.   Consultants:   Neurology  Vascular surgery  Procedures:  LE [place] Summary:  BILATERAL:   - No evidence of deep vein thrombosis seen in the lower extremities,  bilaterally.  -  RIGHT:  interstitial fluid noted throughout calf    LEFT:  - Incidentally, there appears to be occlusion of the left femoral artery  with reconstitution noted in the distal femoral artery. Cannot rule out  thrombus.  Interstitial fluid noted throughout calf.   Echo IMPRESSIONS    1. Left ventricular ejection fraction, by estimation, is 50 to 55%. The  left ventricle has normal function. The left ventricle has no regional  wall motion abnormalities. There is mild left ventricular hypertrophy.  Left ventricular diastolic parameters  are consistent with Grade I diastolic dysfunction (impaired relaxation).  2. Right ventricular systolic function is normal. The right ventricular  size is normal.  3. The mitral valve is normal in structure. No evidence of mitral valve  regurgitation. No evidence of mitral stenosis.  4. The aortic valve is tricuspid. Aortic valve regurgitation is not  visualized. No aortic stenosis is present.  5. The inferior vena cava is normal in size with greater than 50%  respiratory variability, suggesting right atrial pressure of 3 mmHg.   Antimicrobials: Anti-infectives (From admission, onward)   None     Subjective: No complaints today  Objective: Vitals:   10/30/19 0400 10/30/19 0411 10/30/19 0742 10/30/19 1606  BP: (!) 185/99 (!) 190/100 (!) 160/97 (!) 172/87  Pulse: 83  79 83  Resp: 20  16 16   Temp: 98.3 F (36.8 C)  97.6 F (36.4 C) 97.6 F (36.4 C)  TempSrc: Oral     SpO2: 99%  100% 98%  Weight:      Height:        Intake/Output Summary (Last 24 hours) at 10/30/2019 1622 Last data filed at 10/30/2019 1437 Gross per 24 hour  Intake 1310.02 ml  Output --  Net 1310.02 ml   Filed Weights   10/28/19 2139  Weight: 78.5 kg    Examination:  General exam: Appears calm and comfortable  Respiratory system: Clear to auscultation. Respiratory  effort normal. Cardiovascular system: S1 & S2 heard, RRR.  Gastrointestinal system: Abdomen is nondistended, soft and nontender.  Central nervous system: Alert and oriented. No focal neurological deficits. Extremities: 1+ LEE bilaterally Skin: No rashes, lesions or ulcers Psychiatry: Judgement and insight appear normal. Mood & affect appropriate.     Data Reviewed: I have personally reviewed following labs and imaging studies  CBC: Recent Labs  Lab 10/29/19 0149 10/30/19 0350  WBC 4.7 3.5*  NEUTROABS 2.5  --   HGB 12.6* 11.4*  HCT 39.1 34.4*  MCV 84.1 82.7  PLT 154 144*    Basic Metabolic Panel: Recent Labs  Lab 10/29/19 0149 10/30/19 0350 10/30/19 0828  NA 139  --  139  K 3.9  --  3.8  CL 110  --  110  CO2 23  --  21*  GLUCOSE 148*  --  108*  BUN 18  --  16  CREATININE 1.53* 1.42* 1.48*  CALCIUM 8.3*  --  8.2*    GFR: Estimated Creatinine Clearance: 52 mL/min (Ayeden Gladman) (by C-G formula based on  SCr of 1.48 mg/dL (H)).  Liver Function Tests: Recent Labs  Lab 10/29/19 0149 10/30/19 0828  AST 18 19  ALT 18 16  ALKPHOS 94 78  BILITOT 0.5 0.2*  PROT 6.3* 4.7*  ALBUMIN 2.6* 1.9*    CBG: Recent Labs  Lab 10/29/19 1714 10/29/19 2215 10/30/19 0743 10/30/19 1135 10/30/19 1608  GLUCAP 117* 146* 99 159* 81     Recent Results (from the past 240 hour(s))  SARS Coronavirus 2 by RT PCR (hospital order, performed in [facility] hospital lab) Nasopharyngeal Nasopharyngeal Swab     Status: None   Collection Time: 10/29/19  4:45 AM   Specimen: Nasopharyngeal Swab  Result Value Ref Range Status   SARS Coronavirus 2 NEGATIVE NEGATIVE Final    Comment: (NOTE) SARS-CoV-2 target nucleic acids are NOT DETECTED. The SARS-CoV-2 RNA is generally detectable in upper and lower respiratory specimens during the acute phase of infection. The lowest concentration of SARS-CoV-2 viral copies this assay can detect is 250 copies / mL. Cher Franzoni negative result does not preclude SARS-CoV-2  infection and should not be used as the sole basis for treatment or other patient management decisions.  Ellyana Crigler negative result may occur with improper specimen collection / handling, submission of specimen other than nasopharyngeal swab, presence of viral mutation(s) within the areas targeted by this assay, and inadequate number of viral copies (<250 copies / mL). Rogerio Boutelle negative result must be combined with clinical observations, patient history, and epidemiological information. Fact Sheet for Patients:   StrictlyIdeas.no Fact Sheet for Healthcare Providers: BankingDealers.co.za This test is not yet approved or cleared  by the [place] FDA and has been authorized for detection and/or diagnosis of SARS-CoV-2 by FDA under an Emergency Use Authorization (EUA).  This EUA will remain in effect (meaning this test can be used) for the duration of the COVID-19 declaration under Section 564(b)(1) of the Act, 21 U.S.C. section 360bbb-3(b)(1), unless the authorization is terminated or revoked sooner. Performed at [facility], [address] [place], [place] [address]          Radiology Studies: CT Angio Head W or Wo Contrast  Result Date: 10/29/2019 CLINICAL DATA:  Intermittent right-sided numbness and weakness EXAM: CT ANGIOGRAPHY HEAD AND NECK TECHNIQUE: Multidetector CT imaging of the head and neck was performed using the standard protocol during bolus administration of intravenous contrast. Multiplanar CT image reconstructions and MIPs were obtained to evaluate the vascular anatomy. Carotid stenosis measurements (when applicable) are obtained utilizing NASCET criteria, using the distal internal carotid diameter as the denominator. CONTRAST:  69mL OMNIPAQUE IOHEXOL 350 MG/ML SOLN COMPARISON:  Head CT from earlier today FINDINGS: CT HEAD FINDINGS Brain: No evidence of acute infarction, hemorrhage, hydrocephalus, extra-axial collection or  mass lesion/mass effect. Vascular: No hyperdense vessel or unexpected calcification. Skull: Normal. Negative for fracture or focal lesion. Sinuses: No acute finding Orbits: Negative Review of the MIP images confirms the above findings CTA NECK FINDINGS Aortic arch: Atherosclerotic plaque at the arch. There is partial obscuration due to streak from intravenous contrast. Right carotid system: Low-density atheromatous plaque along the distal common carotid. Mild plaque at the bifurcation. No flow limiting stenosis or ulceration. Left carotid system: Mild atheromatous changes without stenosis or ulceration. Vertebral arteries: No proximal subclavian stenosis. 50% narrowing at the proximal right vertebral artery. No beading or flow limiting stenosis. Skeleton: No acute finding Other neck: Negative Upper chest: Negative Review of the MIP images confirms the above findings CTA HEAD FINDINGS Anterior circulation: Calcified  plaque on the carotid siphons with severe left cavernous and supraclinoid ICA narrowing to the degree that the lumen is difficult to accurately measure. The left ICA is smaller than the right from hypoplastic left A1 segment and possibly from superimposed underfilling. No branch occlusion, beading, or aneurysm. There is Drexel Ivey moderate right M2/3 segment stenosis best seen on sagittal reformats. Posterior circulation: Vertebral and basilar arteries are smooth and widely patent. High-grade left P2 segment narrowing, see sagittal reformats. Negative for aneurysm Venous sinuses: Diffusely patent Anatomic variants: As above Review of the MIP images confirms the above findings IMPRESSION: 1. 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 2. Advanced left P2 segment stenosis. 3. 50% right V1 segment stenosis. Electronically Signed   By: Monte Fantasia M.D.   On: 10/29/2019 06:32   DG Chest 1 View  Result Date: 10/29/2019 CLINICAL DATA:  Stroke-like symptoms EXAM: CHEST  1 VIEW COMPARISON:  05/28/2009  FINDINGS: The heart size and mediastinal contours are within normal limits. Both lungs are clear. The visualized skeletal structures are unremarkable. IMPRESSION: No active disease. Electronically Signed   By: Randa Ngo M.D.   On: 10/29/2019 18:50   CT Head Wo Contrast  Result Date: 10/28/2019 CLINICAL DATA:  Right side numbness EXAM: CT HEAD WITHOUT CONTRAST TECHNIQUE: Contiguous axial images were obtained from the base of the skull through the vertex without intravenous contrast. COMPARISON:  None. FINDINGS: Brain: No acute intracranial abnormality. Specifically, no hemorrhage, hydrocephalus, mass lesion, acute infarction, or significant intracranial injury. Vascular: No hyperdense vessel or unexpected calcification. Skull: No acute calvarial abnormality. Sinuses/Orbits: Visualized paranasal sinuses and mastoids clear. Orbital soft tissues unremarkable. Other: None IMPRESSION: No acute intracranial abnormality. Electronically Signed   By: Rolm Baptise M.D.   On: 10/28/2019 22:36   CT Angio Neck W and/or Wo Contrast  Result Date: 10/29/2019 CLINICAL DATA:  Intermittent right-sided numbness and weakness EXAM: CT ANGIOGRAPHY HEAD AND NECK TECHNIQUE: Multidetector CT imaging of the head and neck was performed using the standard protocol during bolus administration of intravenous contrast. Multiplanar CT image reconstructions and MIPs were obtained to evaluate the vascular anatomy. Carotid stenosis measurements (when applicable) are obtained utilizing NASCET criteria, using the distal internal carotid diameter as the denominator. CONTRAST:  58mL OMNIPAQUE IOHEXOL 350 MG/ML SOLN COMPARISON:  Head CT from earlier today FINDINGS: CT HEAD FINDINGS Brain: No evidence of acute infarction, hemorrhage, hydrocephalus, extra-axial collection or mass lesion/mass effect. Vascular: No hyperdense vessel or unexpected calcification. Skull: Normal. Negative for fracture or focal lesion. Sinuses: No acute finding Orbits:  Negative Review of the MIP images confirms the above findings CTA NECK FINDINGS Aortic arch: Atherosclerotic plaque at the arch. There is partial obscuration due to streak from intravenous contrast. Right carotid system: Low-density atheromatous plaque along the distal common carotid. Mild plaque at the bifurcation. No flow limiting stenosis or ulceration. Left carotid system: Mild atheromatous changes without stenosis or ulceration. Vertebral arteries: No proximal subclavian stenosis. 50% narrowing at the proximal right vertebral artery. No beading or flow limiting stenosis. Skeleton: No acute finding Other neck: Negative Upper chest: Negative Review of the MIP images confirms the above findings CTA HEAD FINDINGS Anterior circulation: Calcified plaque on the carotid siphons with severe left cavernous and supraclinoid ICA narrowing to the degree that the lumen is difficult to accurately measure. The left ICA is smaller than the right from hypoplastic left A1 segment and possibly from superimposed underfilling. No branch occlusion, beading, or aneurysm. There is Addalee Kavanagh moderate right M2/3 segment stenosis best  seen on sagittal reformats. Posterior circulation: Vertebral and basilar arteries are smooth and widely patent. High-grade left P2 segment narrowing, see sagittal reformats. Negative for aneurysm Venous sinuses: Diffusely patent Anatomic variants: As above Review of the MIP images confirms the above findings IMPRESSION: 1. 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 2. Advanced left P2 segment stenosis. 3. 50% right V1 segment stenosis. Electronically Signed   By: Monte Fantasia M.D.   On: 10/29/2019 06:32   MR BRAIN WO CONTRAST  Result Date: 10/29/2019 CLINICAL DATA:  Focal neuro deficit, greater than 6 hours, stroke suspected. EXAM: MRI HEAD WITHOUT CONTRAST TECHNIQUE: Multiplanar, multiecho pulse sequences of the brain and surrounding structures were obtained without intravenous contrast. COMPARISON:   Noncontrast head CT 10/28/2019, noncontrast head CT and CT angiogram head/neck performed earlier the same day 10/29/2019. FINDINGS: Brain: The examination is intermittently motion degraded. Most notably, there is moderate motion degradation of the axial T2/FLAIR sequence. There is Trejuan Matherne 10 mm focus of diffusion weighted hyperintensity within the right frontal white matter which shows subtle T2 shine through may reflect Jecenia Leamer late subacute infarct (series 2, image 32). There are subcentimeter acute/early subacute infarcts within the right frontal operculum (series 2, image 29), within the left temporoparietal periventricular white matter (series 2, image 29), within the right frontoparietal white matter (series 2, image 36) and within the midline cerebellum (series 2, image 18). Corresponding T2/FLAIR hyperintensity at these sites. Background mild scattered T2/FLAIR hyperintensity within the cerebral white matter is nonspecific, but likely reflects chronic small vessel ischemic disease. Chronic lacunar infarcts within the thalami and right cerebellar white matter. Cerebral volume is normal for age. No evidence of intracranial mass. No chronic intracranial blood products. No extra-axial fluid collection. No midline shift. Vascular: Expected proximal arterial flow voids. Skull and upper cervical spine: No focal marrow lesion. Sinuses/Orbits: Visualized orbits show no acute finding. Mild ethmoid and maxillary sinus mucosal thickening. No significant mastoid effusion. IMPRESSION: Motion degraded examination as described. 10 mm late subacute infarct within the right frontal white matter. Several additional subcentimeter acute/early subacute infarcts within the right frontal operculum, left temporoparietal periventricular white matter, right frontoparietal white matter and within the midline cerebellum as described. Background mild chronic small vessel ischemic changes within the cerebral white matter. Chronic thalamic and right  cerebellar lacunar infarcts. Mild ethmoid and maxillary sinus mucosal thickening. Electronically Signed   By: Kellie Simmering DO   On: 10/29/2019 19:16   ECHOCARDIOGRAM COMPLETE  Result Date: 10/30/2019    ECHOCARDIOGRAM REPORT   Patient Name:   Douglas Briggs Date of Exam: 10/30/2019 Medical Rec #:  [id number]   Height:       68.0 in Accession #:    [id number]  Weight:       173.0 lb Date of Birth:  [birth year]  BSA:          1.922 m Patient Age:    20 years    BP:           160/97 mmHg Patient Gender: M           HR:           68 bpm. Exam Location:  Inpatient Procedure: 2D Echo Indications:    stroke like symptoms  History:        Patient has no prior history of Echocardiogram examinations.                 Risk Factors:Hypertension and Diabetes.  Sonographer:    Johny Chess Referring Phys:  [place]  1. Left ventricular ejection fraction, by estimation, is 50 to 55%. The left ventricle has normal function. The left ventricle has no regional wall motion abnormalities. There is mild left ventricular hypertrophy. Left ventricular diastolic parameters are consistent with Grade I diastolic dysfunction (impaired relaxation).  2. Right ventricular systolic function is normal. The right ventricular size is normal.  3. The mitral valve is normal in structure. No evidence of mitral valve regurgitation. No evidence of mitral stenosis.  4. The aortic valve is tricuspid. Aortic valve regurgitation is not visualized. No aortic stenosis is present.  5. The inferior vena cava is normal in size with greater than 50% respiratory variability, suggesting right atrial pressure of 3 mmHg. FINDINGS  Left Ventricle: Left ventricular ejection fraction, by estimation, is 50 to 55%. The left ventricle has normal function. The left ventricle has no regional wall motion abnormalities. The left ventricular internal cavity size was normal in size. There is  mild left ventricular hypertrophy. Left ventricular  diastolic parameters are consistent with Grade I diastolic dysfunction (impaired relaxation). Indeterminate filling pressures. Right Ventricle: The right ventricular size is normal. No increase in right ventricular wall thickness. Right ventricular systolic function is normal. Left Atrium: Left atrial size was normal in size. Right Atrium: Right atrial size was normal in size. Pericardium: There is no evidence of pericardial effusion. Mitral Valve: The mitral valve is normal in structure. No evidence of mitral valve regurgitation. No evidence of mitral valve stenosis. Tricuspid Valve: The tricuspid valve is normal in structure. Tricuspid valve regurgitation is not demonstrated. No evidence of tricuspid stenosis. Aortic Valve: The aortic valve is tricuspid. Aortic valve regurgitation is not visualized. No aortic stenosis is present. Aortic valve mean gradient measures 3.7 mmHg. Aortic valve peak gradient measures 6.7 mmHg. Aortic valve area, by VTI measures 3.20 cm. Pulmonic Valve: The pulmonic valve was not well visualized. Pulmonic valve regurgitation is not visualized. No evidence of pulmonic stenosis. Aorta: The aortic root is normal in size and structure. Venous: The inferior vena cava is normal in size with greater than 50% respiratory variability, suggesting right atrial pressure of 3 mmHg. IAS/Shunts: No atrial level shunt detected by color flow Doppler.  LEFT VENTRICLE PLAX 2D LVIDd:         4.80 cm  Diastology LVIDs:         3.60 cm  LV e' medial:   4.68 cm/s LV PW:         1.20 cm  LV E/e' medial: 15.8 LV IVS:        1.00 cm LVOT diam:     2.10 cm LV SV:         90 LV SV Index:   47 LVOT Area:     3.46 cm  RIGHT VENTRICLE             IVC RV S prime:     14.80 cm/s  IVC diam: 1.70 cm TAPSE (M-mode): 1.8 cm LEFT ATRIUM             Index       RIGHT ATRIUM           Index LA diam:        4.20 cm 2.19 cm/m  RA Area:     11.40 cm LA Vol (A2C):   54.9 ml 28.57 ml/m RA Volume:   24.20 ml  12.59 ml/m LA Vol  (A4C):   44.6 ml 23.21 ml/m LA Biplane Vol: 50.5  ml 26.28 ml/m  AORTIC VALVE AV Area (Vmax):    3.25 cm AV Area (Vmean):   3.20 cm AV Area (VTI):     3.20 cm AV Vmax:           128.95 cm/s AV Vmean:          93.327 cm/s AV VTI:            0.283 m AV Peak Grad:      6.7 mmHg AV Mean Grad:      3.7 mmHg LVOT Vmax:         121.00 cm/s LVOT Vmean:        86.100 cm/s LVOT VTI:          0.261 m LVOT/AV VTI ratio: 0.92  AORTA Ao Root diam: 3.10 cm Ao Asc diam:  3.20 cm MITRAL VALVE MV Area (PHT): 3.31 cm     SHUNTS MV Decel Time: 229 msec     Systemic VTI:  0.26 m MV E velocity: 74.10 cm/s   Systemic Diam: 2.10 cm MV Rhythm Wigfall velocity: 100.00 cm/s MV E/Taylan Marez ratio:  0.74 MV Miquan Tandon Prime:    7.9 cm/s Carlyle Dolly MD Electronically signed by Carlyle Dolly MD Signature Date/Time: 10/30/2019/1:28:48 PM    Final    VAS [place] LOWER EXTREMITY VENOUS (DVT)  Result Date: 10/30/2019  Lower Venous DVTStudy Indications: Edema, and stroke.  Comparison Study: No prior study on file for comparison Performing Technologist: Sharion Dove RVS  Examination Guidelines: Gayle Martinez complete evaluation includes B-mode imaging, spectral Doppler, color Doppler, and power Doppler as needed of all accessible portions of each vessel. Bilateral testing is considered an integral part of Gabriell Daigneault complete examination. Limited examinations for reoccurring indications may be performed as noted. The reflux portion of the exam is performed with the patient in reverse Trendelenburg.  +---------+---------------+---------+-----------+----------+--------------+ RIGHT    CompressibilityPhasicitySpontaneityPropertiesThrombus Aging +---------+---------------+---------+-----------+----------+--------------+ CFV      Full           Yes      Yes                                 +---------+---------------+---------+-----------+----------+--------------+ SFJ      Full                                                         +---------+---------------+---------+-----------+----------+--------------+ FV Prox  Full                                                        +---------+---------------+---------+-----------+----------+--------------+ FV Mid   Full                                                        +---------+---------------+---------+-----------+----------+--------------+ FV DistalFull                                                        +---------+---------------+---------+-----------+----------+--------------+  PFV      Full                                                        +---------+---------------+---------+-----------+----------+--------------+ POP      Full           Yes      Yes                                 +---------+---------------+---------+-----------+----------+--------------+ PTV      Full                                                        +---------+---------------+---------+-----------+----------+--------------+ PERO     Full                                                        +---------+---------------+---------+-----------+----------+--------------+ SSV      Full                                         thickened      +---------+---------------+---------+-----------+----------+--------------+   +---------+---------------+---------+-----------+----------+--------------+ LEFT     CompressibilityPhasicitySpontaneityPropertiesThrombus Aging +---------+---------------+---------+-----------+----------+--------------+ CFV      Full           Yes      Yes                                 +---------+---------------+---------+-----------+----------+--------------+ SFJ      Full                                                        +---------+---------------+---------+-----------+----------+--------------+ FV Prox  Full                                                         +---------+---------------+---------+-----------+----------+--------------+ FV Mid   Full                                                        +---------+---------------+---------+-----------+----------+--------------+ FV DistalFull                                                        +---------+---------------+---------+-----------+----------+--------------+  PFV      Full                                                        +---------+---------------+---------+-----------+----------+--------------+ POP      Full                                                        +---------+---------------+---------+-----------+----------+--------------+ PTV      Full                                                        +---------+---------------+---------+-----------+----------+--------------+ PERO     Full                                                        +---------+---------------+---------+-----------+----------+--------------+ SSV      Full                                         thickened      +---------+---------------+---------+-----------+----------+--------------+ Incidentally, 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 Cannot rule out thrombus.    Summary: BILATERAL: - No evidence of deep vein thrombosis seen in the lower extremities, bilaterally. 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 Cannot rule out thrombus. Interstitial fluid noted throughout calf.  *See table(s) above for measurements and observations.    Preliminary         Scheduled Meds: . aspirin EC  325 mg Oral Daily  . atorvastatin  80 mg Oral Daily  . clopidogrel  75 mg Oral Daily  . enoxaparin (LOVENOX) injection  40 mg Subcutaneous Q24H  . feeding supplement (ENSURE  ENLIVE)  237 mL Oral BID BM  . insulin aspart  0-5 Units Subcutaneous QHS  . insulin aspart  0-9 Units Subcutaneous TID WC   Continuous Infusions: . lactated ringers Stopped (10/30/19 0928)     LOS: 0 days    Time spent: over 30 min    Fayrene Helper, MD [facility]   To contact the attending provider between 7A-7P or the covering provider during after hours 7P-7A, please log into the web site [email or web address] and access using universal [place] password for that web site. If you do not have the password, please call the hospital operator.  10/30/2019, 4:22 PM

## 2019-10-31 DIAGNOSIS — G459 Transient cerebral ischemic attack, unspecified: Secondary | ICD-10-CM

## 2019-10-31 LAB — COMPREHENSIVE METABOLIC PANEL
ALT: 17 U/L (ref 0–44)
AST: 16 U/L (ref 15–41)
Albumin: 2.1 g/dL — ABNORMAL LOW (ref 3.5–5.0)
Alkaline Phosphatase: 85 U/L (ref 38–126)
Anion gap: 10 (ref 5–15)
BUN: 21 mg/dL — ABNORMAL HIGH (ref 6–20)
CO2: 22 mmol/L (ref 22–32)
Calcium: 8.5 mg/dL — ABNORMAL LOW (ref 8.9–10.3)
Chloride: 108 mmol/L (ref 98–111)
Creatinine, Ser: 1.65 mg/dL — ABNORMAL HIGH (ref 0.61–1.24)
GFR calc Af Amer: 52 mL/min — ABNORMAL LOW (ref >60)
GFR calc non Af Amer: 45 mL/min — ABNORMAL LOW (ref >60)
Glucose, Bld: 101 mg/dL — ABNORMAL HIGH (ref 70–99)
Potassium: 4 mmol/L (ref 3.5–5.1)
Sodium: 140 mmol/L (ref 135–145)
Total Bilirubin: 0.4 mg/dL (ref 0.3–1.2)
Total Protein: 5 g/dL — ABNORMAL LOW (ref 6.5–8.1)

## 2019-10-31 LAB — CBC WITH DIFFERENTIAL/PLATELET
Abs Immature Granulocytes: 0 10*3/uL (ref 0.00–0.07)
Basophils Absolute: 0 10*3/uL (ref 0.0–0.1)
Basophils Relative: 1 %
Eosinophils Absolute: 0.2 10*3/uL (ref 0.0–0.5)
Eosinophils Relative: 4 %
HCT: 35.7 % — ABNORMAL LOW (ref 39.0–52.0)
Hemoglobin: 11.7 g/dL — ABNORMAL LOW (ref 13.0–17.0)
Immature Granulocytes: 0 %
Lymphocytes Relative: 37 %
Lymphs Abs: 1.5 10*3/uL (ref 0.7–4.0)
MCH: 27.7 pg (ref 26.0–34.0)
MCHC: 32.8 g/dL (ref 30.0–36.0)
MCV: 84.4 fL (ref 80.0–100.0)
Monocytes Absolute: 0.5 10*3/uL (ref 0.1–1.0)
Monocytes Relative: 12 %
Neutro Abs: 1.9 10*3/uL (ref 1.7–7.7)
Neutrophils Relative %: 46 %
Platelets: 149 10*3/uL — ABNORMAL LOW (ref 150–400)
RBC: 4.23 MIL/uL (ref 4.22–5.81)
RDW: 13 % (ref 11.5–15.5)
WBC: 4.1 10*3/uL (ref 4.0–10.5)
nRBC: 0 % (ref 0.0–0.2)

## 2019-10-31 LAB — PHOSPHORUS: Phosphorus: 3.9 mg/dL (ref 2.5–4.6)

## 2019-10-31 LAB — MAGNESIUM: Magnesium: 1.9 mg/dL (ref 1.7–2.4)

## 2019-10-31 LAB — GLUCOSE, CAPILLARY
Glucose-Capillary: 98 mg/dL (ref 70–99)
Glucose-Capillary: 175 mg/dL — ABNORMAL HIGH (ref 70–99)

## 2019-10-31 MED ORDER — LOSARTAN POTASSIUM 25 MG PO TABS: 25.0000 mg | ORAL_TABLET | Freq: Every day | ORAL | 0 refills | Status: DC

## 2019-10-31 MED ORDER — ASPIRIN EC 81 MG PO TBEC: 81.0000 mg | DELAYED_RELEASE_TABLET | Freq: Every day | ORAL | 2 refills | Status: AC

## 2019-10-31 MED ORDER — LOSARTAN POTASSIUM 25 MG PO TABS: 25.0000 mg | ORAL_TABLET | Freq: Every day | ORAL | Status: DC

## 2019-10-31 MED ORDER — CLOPIDOGREL BISULFATE 75 MG PO TABS: 75.0000 mg | ORAL_TABLET | Freq: Every day | ORAL | 2 refills | Status: DC

## 2019-10-31 MED ORDER — HYDROCHLOROTHIAZIDE 12.5 MG PO CAPS: 12.5000 mg | ORAL_CAPSULE | Freq: Every day | ORAL | Status: DC

## 2019-10-31 MED ORDER — ATORVASTATIN CALCIUM 80 MG PO TABS: 80.0000 mg | ORAL_TABLET | Freq: Every day | ORAL | 1 refills | Status: DC

## 2019-10-31 MED ORDER — AMLODIPINE BESYLATE 5 MG PO TABS: 5.0000 mg | ORAL_TABLET | Freq: Every day | ORAL | 1 refills | Status: DC

## 2019-10-31 MED ORDER — AMLODIPINE BESYLATE 5 MG PO TABS: 5.0000 mg | ORAL_TABLET | Freq: Every day | ORAL | Status: DC

## 2019-10-31 NOTE — Progress Notes (Signed)
STROKE TEAM PROGRESS NOTE   INTERVAL HISTORY Patient is sitting up in bed.  He is ready to go home.  He has not had any further episodes of numbness or paresthesias.  He has no complaints. OBJECTIVE Vitals:   10/30/19 1606 10/30/19 2234 10/31/19 0737 10/31/19 0834  BP: (!) 172/87 (!) 156/88 (!) 188/92 (!) 178/79  Pulse: 83 83 71 65  Resp: 16  16   Temp: 97.6 F (36.4 C)  97.9 F (36.6 C)   TempSrc:      SpO2: 98% 100% 97%   Weight:      Height:        CBC:  Recent Labs  Lab 10/29/19 0149 10/29/19 0149 10/30/19 0350 10/31/19 0458  WBC 4.7   < > 3.5* 4.1  NEUTROABS 2.5  --   --  1.9  HGB 12.6*   < > 11.4* 11.7*  HCT 39.1   < > 34.4* 35.7*  MCV 84.1   < > 82.7 84.4  PLT 154   < > 144* 149*   < > = values in this interval not displayed.    Basic Metabolic Panel:  Recent Labs  Lab 10/30/19 0828 10/31/19 0458  NA 139 140  K 3.8 4.0  CL 110 108  CO2 21* 22  GLUCOSE 108* 101*  BUN 16 21*  CREATININE 1.48* 1.65*  CALCIUM 8.2* 8.5*  MG  --  1.9  PHOS  --  3.9    Lipid Panel:     Component Value Date/Time   CHOL 315 (H) 10/30/2019 0350   TRIG 165 (H) 10/30/2019 0350   HDL 52 10/30/2019 0350   CHOLHDL 6.1 10/30/2019 0350   VLDL 33 10/30/2019 0350   LDLCALC 230 (H) 10/30/2019 0350   HgbA1c:  Lab Results  Component Value Date   HGBA1C 6.9 (H) 10/30/2019   Urine Drug Screen:     Component Value Date/Time   LABOPIA NONE DETECTED 10/29/2019 0527   COCAINSCRNUR NONE DETECTED 10/29/2019 0527   LABBENZ NONE DETECTED 10/29/2019 0527   AMPHETMU NONE DETECTED 10/29/2019 0527   THCU NONE DETECTED 10/29/2019 0527   LABBARB NONE DETECTED 10/29/2019 0527    Alcohol Level     Component Value Date/Time   ETH <10 10/29/2019 0149    IMAGING  CT Angio Head W or Wo Contrast CT Angio Neck W and/or Wo Contrast 10/29/2019 IMPRESSION:  1. 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  2. Advanced left P2 segment stenosis.  3. 50% right V1 segment stenosis.    CT Head Wo Contrast 10/28/2019 IMPRESSION:  No acute intracranial abnormality.   MR BRAIN WO CONTRAST 10/29/2019 IMPRESSION:  Motion degraded examination as described. 10 mm late subacute infarct within the right frontal white matter. Several additional subcentimeter acute/early subacute infarcts within the right frontal operculum, left temporoparietal periventricular white matter, right frontoparietal white matter and within the midline cerebellum as described. Background mild chronic small vessel ischemic changes within the cerebral white matter. Chronic thalamic and right cerebellar lacunar infarcts. Mild ethmoid and maxillary sinus mucosal thickening.   DG Chest 1 View 10/29/2019 IMPRESSION:  No active disease.  VAS [place] LOWER EXTREMITY VENOUS (DVT) 10/30/2019 Summary:  BILATERAL: - No evidence of deep vein thrombosis seen in the lower extremities, bilaterally. 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 Cannot rule out thrombus. Interstitial fluid noted throughout  calf.    ECG - SR rate 72 BPM. (See cardiology reading for complete details)   PHYSICAL EXAM  Temp:  [97.6 F (36.4 C)-97.9 F (36.6 C)] 97.9 F (36.6 C) (06/06 0737) Pulse Rate:  [65-83] 65 (06/06 0834) Resp:  [16] 16 (06/06 0737) BP: (156-188)/(79-92) 178/79 (06/06 0834) SpO2:  [97 %-100 %] 97 % (06/06 0737)  General - Well nourished, well developed, in no apparent distress.  Ophthalmologic - fundi not visualized due to noncooperation.  Cardiovascular - Regular rhythm and rate.  Mental Status -  Level of arousal and orientation to time, place, and person were intact. Language including expression, naming, repetition, comprehension was assessed and found intact. Attention span and concentration were normal. Fund of Knowledge was assessed and was intact.  Cranial  Nerves II - XII - II - Visual field intact OU. III, IV, VI - Extraocular movements intact. V - Facial sensation intact bilaterally. VII - Facial movement intact bilaterally. VIII - Hearing & vestibular intact bilaterally. X - Palate elevates symmetrically. XI - Chin turning & shoulder shrug intact bilaterally. XII - Tongue protrusion intact.  Motor Strength - The patient's strength was normal in all extremities and pronator drift was absent.  Bulk was normal and fasciculations were absent.   Motor Tone - Muscle tone was assessed at the neck and appendages and was normal.  Reflexes - The patient's reflexes were symmetrical in all extremities and he had no pathological reflexes.  Sensory - Light touch, temperature/pinprick were assessed and were symmetrical.    Coordination - The patient had normal movements in the hands and feet with no ataxia or dysmetria.  Tremor was absent.  Gait and Station - deferred.   ASSESSMENT/PLAN [name] is a 59 y.o. male with history of HTN, DM and neuropathy with no regular medical f/u who presented to [facility] with a chief complaint of intermittent right sided numbness, blurred vision lightheadedness, word finding difficulty and weakness of the BLE during these episodes over the past several days.  He did not receive IV t-PA due to resolution of deficits.  Stroke: multiple bilateral infarcts - embolic - source unclear  CT head - No acute intracranial abnormality.   MRI head - 10 mm late subacute infarct within the right frontal white matter. Several additional subcentimeter acute/early subacute infarcts within the right frontal operculum, left temporoparietal periventricular white matter, right frontoparietal white matter and within the midline cerebellum as described. Background mild chronic small vessel ischemic changes within the cerebral white matter. Chronic thalamic and right cerebellar lacunar infarcts.   CTA H&N - Severe left cavernous and  supraclinoid ICA stenosis. Advanced left P2 segment stenosis. 50% right V1 segment stenosis.  Bilateral Lower Extremity Venous Dopplers - no DVT bilaterally  2D Echo - pending  Will arrange for outpt TEE and loop with cardiology  Lacey Jensen Virus 2 - negative  LDL - 230  HgbA1c - 6.9  UDS - negative  VTE prophylaxis - Lovenox  No antithrombotic prior to admission, now on aspirin 325 mg daily and clopidogrel 75 mg daily DAPT for 3 months and then ASA alone.   Patient counseled to be compliant with his antithrombotic medications  Ongoing aggressive stroke risk factor management  Therapy recommendations:  None  Disposition:  Pending, likely home  PVD  LE venous doppler - incidental finding of left SFA occlusion with distal recon  Vascular surgery consulted  ABI pending  Follow up with [facility] as outpt  Intracranial stenosis  CTA head  and neck - left ICA siphon high grade stenosis  Asymptomatic this time  On DAPT for 3 months and then ASA alone  On high dose lipitor  Will maximize medication treatment first, if symptomatic stroke in the future, will consider stenting.   Hypertensive urgency, uncontrolled HTN  Home BP meds: none   Current BP meds: none   Blood pressure on the high end, 200s on presentation . Gradually normalize in 2-3 days  . Long-term BP goal 130-150 given severe stenosis of left ICA siphon  Hyperlipidemia  Home Lipid lowering medication: none   LDL 230, goal < 70  Current lipid lowering medication: Lipitor 80 mg daily  Continue statin at discharge  Diabetes  Home diabetic meds: metformin  Current diabetic meds: insulin  HgbA1c 6.9, goal < 7.0  CBG monitoring  Close PCP follow up  CKD  Stage IIIb  Cre 1.53->1.48  Other Stroke Risk Factors  Previous strokes by MRI   Other Active Problems  Code status - Full code Mild anemia due to CKD  Hospital day # 1 Patient is doing well and will be discharged home today.   Follow-up as outpatient with stroke clinic in 6 weeks.  Stroke team will sign off.  Kindly call for questions. Antony Contras, MD  Stroke Neurology 10/31/2019 3:34 PM  To contact Stroke Continuity provider, please refer to [email or web address] After hours, contact General Neurology

## 2019-10-31 NOTE — Discharge Summary (Signed)
Physician Discharge Summary  Douglas Briggs HTD:[id number] DOB: 1960/09/07 DOA: 10/29/2019  PCP: Rubie Maid, MD  Admit date: 10/29/2019 Discharge date: 10/31/2019  Time spent: 40 minutes  Recommendations for Outpatient Follow-up:  1. Follow outpatient CBC/CMP 2. Follow with neurology outpatient - follow for loop recorder and TEE  3. ASA/plavix x 3 months, then ASA alone 4. Follow with vascular surgery outpatient   5. Patient with likely CKD related to HTN, DM -> proteinuria -> follow outpatient 6. Follow blood pressure outpatient and adjust regimen as appropriate 7. Follow diabetes regimen - follow renal function as pt is on metformin with CKD III 8. Consider lasix for trace LE edema (had started several new meds including BP meds, losartan, would follow renal function prior to starting diuretic)  Discharge Diagnoses:  Active Problems:   TIA (transient ischemic attack)   Stroke-like symptoms   Right sided numbness   Stroke [facility])   Discharge Condition: stable  Diet recommendation: heart healthy/diabetic  Filed Weights   10/28/19 2139  Weight: 78.5 kg    History of present illness:  Douglas Dorris Tionne Briggs 59 y.o.malewith medical history significant ofhypertension and diabetes, but has not seen Eilee Schader physician in several years, presents with R sided numbness and blurry vision intermittent over the past several days.  He notes his symptoms started several days ago. He began having R sided numbness that was intermittent. Numbness to the R side of face, R arm, and R leg. He also noted intermittent blurry vision and lightheadedness. He also noted some word finding difficulties. He denied any slurred speech or facial droop to me. He did note weakness during these episodes, he says the weakness was to bilateral lower extremities. He noted several episodes over the past several days. He came to the ED yesterday, but wasn't able to be seen so he returned today. His symptoms are currently  resolved. No CP, SOB, abdominal pain, N/V. No fevers. He notes swelling to his legs over past several weeks. He doesn't smoke or use drugs. Occasional etoh use. Denies fh of heart attack or stroke.   ED Course:Labs, EKG, imaging. Teleneurology c/s, recommended CTA head and neck, MRI brain. Start ASA/plavix.  He was admitted for R sided numbness and found to have strokes on MRI, likely embolic.  Neurology was consulted and recommended ASA/plavix x 3 months and then aspirin alone.  Hospitalization c/b hypertension, elevated creatinine, and vascular US with femoral artery occlusion.  Vascular saw pt in house and is planning for outpatient follow up.  He was started on antihypertensives at discharge.  Stable at time of d/c with resolved symptoms.  See below for additional details  Hospital Course:  Stroke  Multiple Bilateral Infarcts: Intermittent R sided numbness with word finding difficulty and lower extremity weakness prior to admission - currently sx are resolved CT head without acute finding CTA head/neck with severe L cavernous and supraclinoid ICA stenosis, advanced P2 segment stenosis, 50% right V1 segment stenosis MRI with 10 mm late subacute infarct within the R frontal white matter and several additional subcentimeter acute/early subacute infarcts within the R frontal operculum, L temporoparietal periventricular white matter, R frontoparietal white matter, and within the midline cerebellum - chronic thalamic and R cerebellar lacunar infarcs, background mild chronic small vessel ischemic changes within the cerebral white matter (see report) Neurology c/s, concern for embolic stroke, unclear source - they'll arrange outpatient TEE and loop recorder with cardiology Recommending DAPT x 3 months and then aspirin alone.  Lipitor 80 mg. Regarding intracranial stenosis,  neurology recommending maximizing medical treatment first, then considering stenting A1c 6.9, LDL 230 PT/OT/SLP -> no  needs Permissive hypertension for now - will start BP meds and start gradually lowering BP Echo with EF 95-%%%, grade 1 diastolic dysfunction Continue telemetry  Bilateral Lower extremity Edema:  Follow echo (low normal EF, 50-55% - see report) Follow UP/C outpatient  LE [place] without DVT bilaterally Consider adding lasix outpatient   PAD with Chronic L SFA Occlusion: Incidentally with occlusion of L femoral artery Appreciate vascular surgery -> planning to get ABI's and then follow outpatient in Douglas Briggs few months Mild RLE arterial disease and moderate LLE arterial disease  T2DM on metformin at home Hold metformin, SSI Follow A1c (6.9), lipid panel (LDL 230) Resume metformin at home - watch renal function  Hypertension:  Amlodipine and losartan given renal function/proteinuria  Elevated Creatinine  Proteinuria:  Appears c/w CKD Follow with losartan Follow UP/c outpatient  Procedures: ABI Summary:  Right: Resting right ankle-brachial index indicates mild right lower  extremity arterial disease. The right toe-brachial index is abnormal.   Left: Resting left ankle-brachial index indicates moderate left lower  extremity arterial disease. The left toe-brachial index is abnormal.   LE [place] Summary:  BILATERAL:  - No evidence of deep vein thrombosis seen in the lower extremities,  bilaterally.  -  RIGHT:  interstitial fluid noted throughout calf    LEFT:  - Incidentally, there appears to be occlusion of the left femoral artery  with reconstitution noted in the distal femoral artery. Cannot rule out  thrombus.  Interstitial fluid noted throughout calf.   Echo IMPRESSIONS    1. Left ventricular ejection fraction, by estimation, is 50 to 55%. The  left ventricle has normal function. The left ventricle has no regional  wall motion abnormalities. There is mild left ventricular hypertrophy.  Left ventricular diastolic parameters  are consistent with Grade I diastolic  dysfunction (impaired relaxation).  2. Right ventricular systolic function is normal. The right ventricular  size is normal.  3. The mitral valve is normal in structure. No evidence of mitral valve  regurgitation. No evidence of mitral stenosis.  4. The aortic valve is tricuspid. Aortic valve regurgitation is not  visualized. No aortic stenosis is present.  5. The inferior vena cava is normal in size with greater than 50%  respiratory variability, suggesting right atrial pressure of 3 mmHg.  Consultations:  Vascular  neurology  Discharge Exam: Vitals:   10/31/19 0737 10/31/19 0834  BP: (!) 188/92 (!) 178/79  Pulse: 71 65  Resp: 16   Temp: 97.9 F (36.6 C)   SpO2: 97%    No new complaints Feeling well Discussed d/c plan  General: No acute distress. Cardiovascular: Heart sounds show Douglas Briggs regular rate, and rhythm.  Lungs: Clear to auscultation bilaterally  Abdomen: Soft, nontender, nondistended  Neurological: Alert and oriented 3. Moves all extremities 4 with equal strength. Cranial nerves II through XII intact. Skin: Warm and dry. No rashes or lesions. Extremities: No clubbing or cyanosis. Trace edema.   Discharge Instructions   Discharge Instructions    Ambulatory referral to Neurology   Complete by: As directed    Follow up with stroke clinic NP (Jessica Vanschaick or Cecille Rubin, if both not available, consider Zachery Dauer, or Ahern) at [facility] in about 4 weeks. Thanks.   Call MD for:  difficulty breathing, headache or visual disturbances   Complete by: As directed    Call MD for:  extreme fatigue   Complete by:  As directed    Call MD for:  hives   Complete by: As directed    Call MD for:  persistant dizziness or light-headedness   Complete by: As directed    Call MD for:  persistant nausea and vomiting   Complete by: As directed    Call MD for:  redness, tenderness, or signs of infection (pain, swelling, redness, odor or green/yellow discharge around  incision site)   Complete by: As directed    Call MD for:  severe uncontrolled pain   Complete by: As directed    Call MD for:  temperature >100.4   Complete by: As directed    Diet - low sodium heart healthy   Complete by: As directed    Discharge instructions   Complete by: As directed    You were seen for right sided numbness and difficulty finding your words.  You were found to have strokes on your imaging.  We've started you on aspirin and plavix.  You'll take these for 3 months and then take aspirin alone.  You've also been started on lipitor, Kosisochukwu Burningham cholesterol lowering drug to help decrease the risk of strokes.  Please follow up with neurology as an outpatient.  They're going to arrange follow up for Broderick Fonseca transesophageal echo and Siddarth Hsiung loop recorder to help determine the best treatment for you.  It will be important to control your blood pressure long term.  We've started you on amlodipine and losartan at discharge.  Follow your blood pressure with your PCP.  You'll need repeat labs for your kidney function and electrolytes.  Continue to take your metformin for your diabetes.  Have your PCP monitor your kidney function while you're on this medication.  You likely have chronic kidney disease related to your blood pressure and diabetes.  You have protein in your urine.  Please follow your kidney function and the protein in your urine up with your PCP and consider seeing Sherman Lipuma kidney doctor.  You have peripheral vascular disease and vascular surgery will arrange follow up with you as an outpatient.   Return for new, recurrent, or worsening symptoms.  Please ask your PCP to request records from this hospitalization so they know what was done and what the next steps will be.   Increase activity slowly   Complete by: As directed      Allergies as of 10/31/2019   No Known Allergies     Medication List    STOP taking these medications   GOODY HEADACHE PO     TAKE these medications    amLODipine 5 MG tablet Commonly known as: NORVASC Take 1 tablet (5 mg total) by mouth daily.   aspirin EC 81 MG tablet Take 1 tablet (81 mg total) by mouth daily.   atorvastatin 80 MG tablet Commonly known as: LIPITOR Take 1 tablet (80 mg total) by mouth daily. Start taking on: November 01, 2019   clopidogrel 75 MG tablet Commonly known as: PLAVIX Take 1 tablet (75 mg total) by mouth daily. Start taking on: November 01, 2019   gabapentin 100 MG capsule Commonly known as: NEURONTIN Take 200 mg by mouth at bedtime.   losartan 25 MG tablet Commonly known as: COZAAR Take 1 tablet (25 mg total) by mouth daily.   metFORMIN 500 MG tablet Commonly known as: GLUCOPHAGE Take 500 mg by mouth 2 (two) times daily with Averlee Swartz meal.   multivitamin with minerals Tabs tablet Take 1 tablet by mouth daily with breakfast.  VITAMIN D PO Take 1 tablet by mouth daily with supper.   VITAMIN E PO Take 1 capsule by mouth daily with lunch.      No Known Allergies Follow-up Information    [facility]. Schedule an appointment as soon as possible for Abigael Mogle visit in 4 week(s).   Specialty: Neurology Contact information: [address] [place] [phone]       Rubie Maid, MD Follow up.   Specialty: Family Medicine Contact information: [address] [place] [address] [phone]        Elam Dutch, MD Follow up.   Specialties: Vascular Surgery, Cardiology Why: Call for [place] follow up appointment for your peripheral vascular disease Contact information: [place] [address] [phone]            The results of significant diagnostics from this hospitalization (including imaging, microbiology, ancillary and laboratory) are listed below for reference.    Significant Diagnostic Studies: CT Angio Head W or Wo Contrast  Result Date: 10/29/2019 CLINICAL DATA:  Intermittent right-sided numbness and  weakness EXAM: CT ANGIOGRAPHY HEAD AND NECK TECHNIQUE: Multidetector CT imaging of the head and neck was performed using the standard protocol during bolus administration of intravenous contrast. Multiplanar CT image reconstructions and MIPs were obtained to evaluate the vascular anatomy. Carotid stenosis measurements (when applicable) are obtained utilizing NASCET criteria, using the distal internal carotid diameter as the denominator. CONTRAST:  19mL OMNIPAQUE IOHEXOL 350 MG/ML SOLN COMPARISON:  Head CT from earlier today FINDINGS: CT HEAD FINDINGS Brain: No evidence of acute infarction, hemorrhage, hydrocephalus, extra-axial collection or mass lesion/mass effect. Vascular: No hyperdense vessel or unexpected calcification. Skull: Normal. Negative for fracture or focal lesion. Sinuses: No acute finding Orbits: Negative Review of the MIP images confirms the above findings CTA NECK FINDINGS Aortic arch: Atherosclerotic plaque at the arch. There is partial obscuration due to streak from intravenous contrast. Right carotid system: Low-density atheromatous plaque along the distal common carotid. Mild plaque at the bifurcation. No flow limiting stenosis or ulceration. Left carotid system: Mild atheromatous changes without stenosis or ulceration. Vertebral arteries: No proximal subclavian stenosis. 50% narrowing at the proximal right vertebral artery. No beading or flow limiting stenosis. Skeleton: No acute finding Other neck: Negative Upper chest: Negative Review of the MIP images confirms the above findings CTA HEAD FINDINGS Anterior circulation: Calcified plaque on the carotid siphons with severe left cavernous and supraclinoid ICA narrowing to the degree that the lumen is difficult to accurately measure. The left ICA is smaller than the right from hypoplastic left A1 segment and possibly from superimposed underfilling. No branch occlusion, beading, or aneurysm. There is Eliab Briggs moderate right M2/3 segment stenosis best seen  on sagittal reformats. Posterior circulation: Vertebral and basilar arteries are smooth and widely patent. High-grade left P2 segment narrowing, see sagittal reformats. Negative for aneurysm Venous sinuses: Diffusely patent Anatomic variants: As above Review of the MIP images confirms the above findings IMPRESSION: 1. 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 2. Advanced left P2 segment stenosis. 3. 50% right V1 segment stenosis. Electronically Signed   By: Monte Fantasia M.D.   On: 10/29/2019 06:32   DG Chest 1 View  Result Date: 10/29/2019 CLINICAL DATA:  Stroke-like symptoms EXAM: CHEST  1 VIEW COMPARISON:  05/28/2009 FINDINGS: The heart size and mediastinal contours are within normal limits. Both lungs are clear. The visualized skeletal structures are unremarkable. IMPRESSION: No active disease. Electronically Signed   By: Legrand Como  Owens Shark M.D.   On: 10/29/2019 18:50   CT Head Wo Contrast  Result Date: 10/28/2019 CLINICAL DATA:  Right side numbness EXAM: CT HEAD WITHOUT CONTRAST TECHNIQUE: Contiguous axial images were obtained from the base of the skull through the vertex without intravenous contrast. COMPARISON:  None. FINDINGS: Brain: No acute intracranial abnormality. Specifically, no hemorrhage, hydrocephalus, mass lesion, acute infarction, or significant intracranial injury. Vascular: No hyperdense vessel or unexpected calcification. Skull: No acute calvarial abnormality. Sinuses/Orbits: Visualized paranasal sinuses and mastoids clear. Orbital soft tissues unremarkable. Other: None IMPRESSION: No acute intracranial abnormality. Electronically Signed   By: Rolm Baptise M.D.   On: 10/28/2019 22:36   CT Angio Neck W and/or Wo Contrast  Result Date: 10/29/2019 CLINICAL DATA:  Intermittent right-sided numbness and weakness EXAM: CT ANGIOGRAPHY HEAD AND NECK TECHNIQUE: Multidetector CT imaging of the head and neck was performed using the standard protocol during bolus administration of  intravenous contrast. Multiplanar CT image reconstructions and MIPs were obtained to evaluate the vascular anatomy. Carotid stenosis measurements (when applicable) are obtained utilizing NASCET criteria, using the distal internal carotid diameter as the denominator. CONTRAST:  107mL OMNIPAQUE IOHEXOL 350 MG/ML SOLN COMPARISON:  Head CT from earlier today FINDINGS: CT HEAD FINDINGS Brain: No evidence of acute infarction, hemorrhage, hydrocephalus, extra-axial collection or mass lesion/mass effect. Vascular: No hyperdense vessel or unexpected calcification. Skull: Normal. Negative for fracture or focal lesion. Sinuses: No acute finding Orbits: Negative Review of the MIP images confirms the above findings CTA NECK FINDINGS Aortic arch: Atherosclerotic plaque at the arch. There is partial obscuration due to streak from intravenous contrast. Right carotid system: Low-density atheromatous plaque along the distal common carotid. Mild plaque at the bifurcation. No flow limiting stenosis or ulceration. Left carotid system: Mild atheromatous changes without stenosis or ulceration. Vertebral arteries: No proximal subclavian stenosis. 50% narrowing at the proximal right vertebral artery. No beading or flow limiting stenosis. Skeleton: No acute finding Other neck: Negative Upper chest: Negative Review of the MIP images confirms the above findings CTA HEAD FINDINGS Anterior circulation: Calcified plaque on the carotid siphons with severe left cavernous and supraclinoid ICA narrowing to the degree that the lumen is difficult to accurately measure. The left ICA is smaller than the right from hypoplastic left A1 segment and possibly from superimposed underfilling. No branch occlusion, beading, or aneurysm. There is Douglas Briggs moderate right M2/3 segment stenosis best seen on sagittal reformats. Posterior circulation: Vertebral and basilar arteries are smooth and widely patent. High-grade left P2 segment narrowing, see sagittal reformats.  Negative for aneurysm Venous sinuses: Diffusely patent Anatomic variants: As above Review of the MIP images confirms the above findings IMPRESSION: 1. 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 2. Advanced left P2 segment stenosis. 3. 50% right V1 segment stenosis. Electronically Signed   By: Monte Fantasia M.D.   On: 10/29/2019 06:32   MR BRAIN WO CONTRAST  Result Date: 10/29/2019 CLINICAL DATA:  Focal neuro deficit, greater than 6 hours, stroke suspected. EXAM: MRI HEAD WITHOUT CONTRAST TECHNIQUE: Multiplanar, multiecho pulse sequences of the brain and surrounding structures were obtained without intravenous contrast. COMPARISON:  Noncontrast head CT 10/28/2019, noncontrast head CT and CT angiogram head/neck performed earlier the same day 10/29/2019. FINDINGS: Brain: The examination is intermittently motion degraded. Most notably, there is moderate motion degradation of the axial T2/FLAIR sequence. There is Andriel Omalley 10 mm focus of diffusion weighted hyperintensity within the right frontal white matter which shows subtle T2 shine through may reflect Douglas Briggs late subacute infarct (series 2,  image 32). There are subcentimeter acute/early subacute infarcts within the right frontal operculum (series 2, image 29), within the left temporoparietal periventricular white matter (series 2, image 29), within the right frontoparietal white matter (series 2, image 36) and within the midline cerebellum (series 2, image 18). Corresponding T2/FLAIR hyperintensity at these sites. Background mild scattered T2/FLAIR hyperintensity within the cerebral white matter is nonspecific, but likely reflects chronic small vessel ischemic disease. Chronic lacunar infarcts within the thalami and right cerebellar white matter. Cerebral volume is normal for age. No evidence of intracranial mass. No chronic intracranial blood products. No extra-axial fluid collection. No midline shift. Vascular: Expected proximal arterial flow voids. Skull and  upper cervical spine: No focal marrow lesion. Sinuses/Orbits: Visualized orbits show no acute finding. Mild ethmoid and maxillary sinus mucosal thickening. No significant mastoid effusion. IMPRESSION: Motion degraded examination as described. 10 mm late subacute infarct within the right frontal white matter. Several additional subcentimeter acute/early subacute infarcts within the right frontal operculum, left temporoparietal periventricular white matter, right frontoparietal white matter and within the midline cerebellum as described. Background mild chronic small vessel ischemic changes within the cerebral white matter. Chronic thalamic and right cerebellar lacunar infarcts. Mild ethmoid and maxillary sinus mucosal thickening. Electronically Signed   By: Kellie Simmering DO   On: 10/29/2019 19:16   VAS [place] ABI WITH/WO TBI  Result Date: 10/30/2019 LOWER EXTREMITY DOPPLER STUDY Indications: Claudication, and Incidental finding of left SFA occlusion during              venous duplex. High Risk Factors: Diabetes. Other Factors: Admitted with stroke.  Comparison Study: No prior study on file Performing Technologist: Sharion Dove RVS  Examination Guidelines: Dalayah Deahl complete evaluation includes at minimum, Doppler waveform signals and systolic blood pressure reading at the level of bilateral brachial, anterior tibial, and posterior tibial arteries, when vessel segments are accessible. Bilateral testing is considered an integral part of Zakyah Yanes complete examination. Photoelectric Plethysmograph (PPG) waveforms and toe systolic pressure readings are included as required and additional duplex testing as needed. Limited examinations for reoccurring indications may be performed as noted.  ABI Findings: +---------+------------------+-----+---------+--------+ Right    Rt Pressure (mmHg)IndexWaveform Comment  +---------+------------------+-----+---------+--------+ Brachial 180                    triphasic          +---------+------------------+-----+---------+--------+ PTA      156               0.87 biphasic          +---------+------------------+-----+---------+--------+ DP       117               0.65 biphasic          +---------+------------------+-----+---------+--------+ Great Toe78                0.43                   +---------+------------------+-----+---------+--------+ +---------+------------------+-----+---------+-------+ Left     Lt Pressure (mmHg)IndexWaveform Comment +---------+------------------+-----+---------+-------+ Brachial 174                    triphasic        +---------+------------------+-----+---------+-------+ PTA      119               0.66 biphasic         +---------+------------------+-----+---------+-------+ DP       98  0.54 biphasic         +---------+------------------+-----+---------+-------+ Great Toe86                0.48                  +---------+------------------+-----+---------+-------+ +-------+-----------+-----------+------------+------------+ ABI/TBIToday's ABIToday's TBIPrevious ABIPrevious TBI +-------+-----------+-----------+------------+------------+ Right  0.87       0.43                                +-------+-----------+-----------+------------+------------+ Left   0.66       0.48                                +-------+-----------+-----------+------------+------------+  Summary: Right: Resting right ankle-brachial index indicates mild right lower extremity arterial disease. The right toe-brachial index is abnormal. Left: Resting left ankle-brachial index indicates moderate left lower extremity arterial disease. The left toe-brachial index is abnormal.  *See table(s) above for measurements and observations.     Preliminary    ECHOCARDIOGRAM COMPLETE  Result Date: 10/30/2019    ECHOCARDIOGRAM REPORT   Patient Name:   Douglas Briggs Date of Exam: 10/30/2019 Medical Rec #:  [id number]   Height:        68.0 in Accession #:    [id number]  Weight:       173.0 lb Date of Birth:  [birth year]  BSA:          1.922 m Patient Age:    6 years    BP:           160/97 mmHg Patient Gender: M           HR:           68 bpm. Exam Location:  Inpatient Procedure: 2D Echo Indications:    stroke like symptoms  History:        Patient has no prior history of Echocardiogram examinations.                 Risk Factors:Hypertension and Diabetes.  Sonographer:    Johny Chess Referring Phys: [id number] Barnes Florek Douglas POWELL [place]  1. Left ventricular ejection fraction, by estimation, is 50 to 55%. The left ventricle has normal function. The left ventricle has no regional wall motion abnormalities. There is mild left ventricular hypertrophy. Left ventricular diastolic parameters are consistent with Grade I diastolic dysfunction (impaired relaxation).  2. Right ventricular systolic function is normal. The right ventricular size is normal.  3. The mitral valve is normal in structure. No evidence of mitral valve regurgitation. No evidence of mitral stenosis.  4. The aortic valve is tricuspid. Aortic valve regurgitation is not visualized. No aortic stenosis is present.  5. The inferior vena cava is normal in size with greater than 50% respiratory variability, suggesting right atrial pressure of 3 mmHg. FINDINGS  Left Ventricle: Left ventricular ejection fraction, by estimation, is 50 to 55%. The left ventricle has normal function. The left ventricle has no regional wall motion abnormalities. The left ventricular internal cavity size was normal in size. There is  mild left ventricular hypertrophy. Left ventricular diastolic parameters are consistent with Grade I diastolic dysfunction (impaired relaxation). Indeterminate filling pressures. Right Ventricle: The right ventricular size is normal. No increase in right ventricular wall thickness. Right ventricular systolic function is normal. Left Atrium: Left atrial size was normal in  size. Right Atrium: Right atrial  size was normal in size. Pericardium: There is no evidence of pericardial effusion. Mitral Valve: The mitral valve is normal in structure. No evidence of mitral valve regurgitation. No evidence of mitral valve stenosis. Tricuspid Valve: The tricuspid valve is normal in structure. Tricuspid valve regurgitation is not demonstrated. No evidence of tricuspid stenosis. Aortic Valve: The aortic valve is tricuspid. Aortic valve regurgitation is not visualized. No aortic stenosis is present. Aortic valve mean gradient measures 3.7 mmHg. Aortic valve peak gradient measures 6.7 mmHg. Aortic valve area, by VTI measures 3.20 cm. Pulmonic Valve: The pulmonic valve was not well visualized. Pulmonic valve regurgitation is not visualized. No evidence of pulmonic stenosis. Aorta: The aortic root is normal in size and structure. Venous: The inferior vena cava is normal in size with greater than 50% respiratory variability, suggesting right atrial pressure of 3 mmHg. IAS/Shunts: No atrial level shunt detected by color flow Doppler.  LEFT VENTRICLE PLAX 2D LVIDd:         4.80 cm  Diastology LVIDs:         3.60 cm  LV e' medial:   4.68 cm/s LV PW:         1.20 cm  LV E/e' medial: 15.8 LV IVS:        1.00 cm LVOT diam:     2.10 cm LV SV:         90 LV SV Index:   47 LVOT Area:     3.46 cm  RIGHT VENTRICLE             IVC RV S prime:     14.80 cm/s  IVC diam: 1.70 cm TAPSE (M-mode): 1.8 cm LEFT ATRIUM             Index       RIGHT ATRIUM           Index LA diam:        4.20 cm 2.19 cm/m  RA Area:     11.40 cm LA Vol (A2C):   54.9 ml 28.57 ml/m RA Volume:   24.20 ml  12.59 ml/m LA Vol (A4C):   44.6 ml 23.21 ml/m LA Biplane Vol: 50.5 ml 26.28 ml/m  AORTIC VALVE AV Area (Vmax):    3.25 cm AV Area (Vmean):   3.20 cm AV Area (VTI):     3.20 cm AV Vmax:           128.95 cm/s AV Vmean:          93.327 cm/s AV VTI:            0.283 m AV Peak Grad:      6.7 mmHg AV Mean Grad:      3.7 mmHg LVOT Vmax:          121.00 cm/s LVOT Vmean:        86.100 cm/s LVOT VTI:          0.261 m LVOT/AV VTI ratio: 0.92  AORTA Ao Root diam: 3.10 cm Ao Asc diam:  3.20 cm MITRAL VALVE MV Area (PHT): 3.31 cm     SHUNTS MV Decel Time: 229 msec     Systemic VTI:  0.26 m MV E velocity: 74.10 cm/s   Systemic Diam: 2.10 cm MV Antario Yasuda velocity: 100.00 cm/s MV E/Avri Paiva ratio:  0.74 MV Etana Beets Prime:    7.9 cm/s Carlyle Dolly MD Electronically signed by Carlyle Dolly MD Signature Date/Time: 10/30/2019/1:28:48 PM    Final    VAS [place] LOWER EXTREMITY  VENOUS (DVT)  Result Date: 10/30/2019  Lower Venous DVTStudy Indications: Edema, and stroke.  Comparison Study: No prior study on file for comparison Performing Technologist: Sharion Dove RVS  Examination Guidelines: Issiah Huffaker complete evaluation includes B-mode imaging, spectral Doppler, color Doppler, and power Doppler as needed of all accessible portions of each vessel. Bilateral testing is considered an integral part of Keeven Matty complete examination. Limited examinations for reoccurring indications may be performed as noted. The reflux portion of the exam is performed with the patient in reverse Trendelenburg.  +---------+---------------+---------+-----------+----------+--------------+ RIGHT    CompressibilityPhasicitySpontaneityPropertiesThrombus Aging +---------+---------------+---------+-----------+----------+--------------+ CFV      Full           Yes      Yes                                 +---------+---------------+---------+-----------+----------+--------------+ SFJ      Full                                                        +---------+---------------+---------+-----------+----------+--------------+ FV Prox  Full                                                        +---------+---------------+---------+-----------+----------+--------------+ FV Mid   Full                                                         +---------+---------------+---------+-----------+----------+--------------+ FV DistalFull                                                        +---------+---------------+---------+-----------+----------+--------------+ PFV      Full                                                        +---------+---------------+---------+-----------+----------+--------------+ POP      Full           Yes      Yes                                 +---------+---------------+---------+-----------+----------+--------------+ PTV      Full                                                        +---------+---------------+---------+-----------+----------+--------------+ PERO     Full                                                        +---------+---------------+---------+-----------+----------+--------------+  SSV      Full                                         thickened      +---------+---------------+---------+-----------+----------+--------------+   +---------+---------------+---------+-----------+----------+--------------+ LEFT     CompressibilityPhasicitySpontaneityPropertiesThrombus Aging +---------+---------------+---------+-----------+----------+--------------+ CFV      Full           Yes      Yes                                 +---------+---------------+---------+-----------+----------+--------------+ SFJ      Full                                                        +---------+---------------+---------+-----------+----------+--------------+ FV Prox  Full                                                        +---------+---------------+---------+-----------+----------+--------------+ FV Mid   Full                                                        +---------+---------------+---------+-----------+----------+--------------+ FV DistalFull                                                         +---------+---------------+---------+-----------+----------+--------------+ PFV      Full                                                        +---------+---------------+---------+-----------+----------+--------------+ POP      Full                                                        +---------+---------------+---------+-----------+----------+--------------+ PTV      Full                                                        +---------+---------------+---------+-----------+----------+--------------+ PERO     Full                                                        +---------+---------------+---------+-----------+----------+--------------+  SSV      Full                                         thickened      +---------+---------------+---------+-----------+----------+--------------+ Incidentally, 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 Cannot rule out thrombus.    Summary: BILATERAL: - No evidence of deep vein thrombosis seen in the lower extremities, bilaterally. 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 Cannot rule out thrombus. Interstitial fluid noted throughout calf.  *See table(s) above for measurements and observations.    Preliminary     Microbiology: Recent Results (from the past 240 hour(s))  SARS Coronavirus 2 by RT PCR (hospital order, performed in [facility] hospital lab) Nasopharyngeal Nasopharyngeal Swab     Status: None   Collection Time: 10/29/19  4:45 AM   Specimen: Nasopharyngeal Swab  Result Value Ref Range Status   SARS Coronavirus 2 NEGATIVE NEGATIVE Final    Comment: (NOTE) SARS-CoV-2 target nucleic acids are NOT DETECTED. The SARS-CoV-2 RNA is generally detectable in upper and lower respiratory specimens during the acute  phase of infection. The lowest concentration of SARS-CoV-2 viral copies this assay can detect is 250 copies / mL. Suhey Radford negative result does not preclude SARS-CoV-2 infection and should not be used as the sole basis for treatment or other patient management decisions.  Derion Kreiter negative result may occur with improper specimen collection / handling, submission of specimen other than nasopharyngeal swab, presence of viral mutation(s) within the areas targeted by this assay, and inadequate number of viral copies (<250 copies / mL). Emylee Decelle negative result must be combined with clinical observations, patient history, and epidemiological information. Fact Sheet for Patients:   StrictlyIdeas.no Fact Sheet for Healthcare Providers: BankingDealers.co.za This test is not yet approved or cleared  by the [place] FDA and has been authorized for detection and/or diagnosis of SARS-CoV-2 by FDA under an Emergency Use Authorization (EUA).  This EUA will remain in effect (meaning this test can be used) for the duration of the COVID-19 declaration under Section 564(b)(1) of the Act, 21 U.S.C. section 360bbb-3(b)(1), unless the authorization is terminated or revoked sooner. Performed at [facility], [address] [place], [place] [address]      Labs: Basic Metabolic Panel: Recent Labs  Lab 10/29/19 0149 10/30/19 0350 10/30/19 0828 10/31/19 0458  NA 139  --  139 140  K 3.9  --  3.8 4.0  CL 110  --  110 108  CO2 23  --  21* 22  GLUCOSE 148*  --  108* 101*  BUN 18  --  16 21*  CREATININE 1.53* 1.42* 1.48* 1.65*  CALCIUM 8.3*  --  8.2* 8.5*  MG  --   --   --  1.9  PHOS  --   --   --  3.9   Liver Function Tests: Recent Labs  Lab 10/29/19 0149 10/30/19 0828 10/31/19 0458  AST 18 19 16   ALT 18 16 17   ALKPHOS 94 78 85  BILITOT 0.5 0.2* 0.4  PROT 6.3* 4.7* 5.0*  ALBUMIN 2.6* 1.9* 2.1*   No results for input(s): LIPASE, AMYLASE in the last  168 hours. No results for input(s):  AMMONIA in the last 168 hours. CBC: Recent Labs  Lab 10/29/19 0149 10/30/19 0350 10/31/19 0458  WBC 4.7 3.5* 4.1  NEUTROABS 2.5  --  1.9  HGB 12.6* 11.4* 11.7*  HCT 39.1 34.4* 35.7*  MCV 84.1 82.7 84.4  PLT 154 144* 149*   Cardiac Enzymes: Recent Labs  Lab 10/30/19 0828  CKTOTAL 180   BNP: BNP (last 3 results) No results for input(s): BNP in the last 8760 hours.  ProBNP (last 3 results) No results for input(s): PROBNP in the last 8760 hours.  CBG: Recent Labs  Lab 10/30/19 1135 10/30/19 1608 10/30/19 2237 10/31/19 0758 10/31/19 1121  GLUCAP 159* 81 98 98 175*       Signed:  Fayrene Helper MD.  [facility] 10/31/2019, 3:47 PM

## 2019-11-07 ENCOUNTER — Emergency Department (HOSPITAL_BASED_OUTPATIENT_CLINIC_OR_DEPARTMENT_OTHER)
Admission: EM | Admit: 2019-11-07 | Discharge: 2019-11-07 | Disposition: A | Discharge status: Home / Self Care | Payer: No Typology Code available for payment source | Attending: Emergency Medicine | Admitting: Emergency Medicine

## 2019-11-07 ENCOUNTER — Other Ambulatory Visit: Payer: Self-pay

## 2019-11-07 ENCOUNTER — Encounter (HOSPITAL_BASED_OUTPATIENT_CLINIC_OR_DEPARTMENT_OTHER): Payer: Self-pay | Admitting: Emergency Medicine

## 2019-11-07 DIAGNOSIS — R042 Hemoptysis: Secondary | ICD-10-CM | POA: Insufficient documentation

## 2019-11-07 DIAGNOSIS — R531 Weakness: Secondary | ICD-10-CM | POA: Insufficient documentation

## 2019-11-07 DIAGNOSIS — E119 Type 2 diabetes mellitus without complications: Secondary | ICD-10-CM | POA: Insufficient documentation

## 2019-11-07 DIAGNOSIS — I10 Essential (primary) hypertension: Secondary | ICD-10-CM

## 2019-11-07 DIAGNOSIS — Z8673 Personal history of transient ischemic attack (TIA), and cerebral infarction without residual deficits: Secondary | ICD-10-CM | POA: Diagnosis not present

## 2019-11-07 DIAGNOSIS — R0602 Shortness of breath: Secondary | ICD-10-CM | POA: Diagnosis not present

## 2019-11-07 DIAGNOSIS — R079 Chest pain, unspecified: Secondary | ICD-10-CM | POA: Diagnosis not present

## 2019-11-07 DIAGNOSIS — Z79899 Other long term (current) drug therapy: Secondary | ICD-10-CM | POA: Insufficient documentation

## 2019-11-07 HISTORY — DX: Cerebral infarction, unspecified: I63.9

## 2019-11-07 LAB — CBC WITH DIFFERENTIAL/PLATELET
Abs Immature Granulocytes: 0.05 10*3/uL (ref 0.00–0.07)
Basophils Absolute: 0.1 10*3/uL (ref 0.0–0.1)
Basophils Relative: 1 %
Eosinophils Absolute: 0.2 10*3/uL (ref 0.0–0.5)
Eosinophils Relative: 5 %
HCT: 39.3 % (ref 39.0–52.0)
Hemoglobin: 13.1 g/dL (ref 13.0–17.0)
Immature Granulocytes: 1 %
Lymphocytes Relative: 30 %
Lymphs Abs: 1.6 10*3/uL (ref 0.7–4.0)
MCH: 27.7 pg (ref 26.0–34.0)
MCHC: 33.3 g/dL (ref 30.0–36.0)
MCV: 83.1 fL (ref 80.0–100.0)
Monocytes Absolute: 0.5 10*3/uL (ref 0.1–1.0)
Monocytes Relative: 10 %
Neutro Abs: 2.8 10*3/uL (ref 1.7–7.7)
Neutrophils Relative %: 53 %
Platelets: 144 10*3/uL — ABNORMAL LOW (ref 150–400)
RBC: 4.73 MIL/uL (ref 4.22–5.81)
RDW: 12.8 % (ref 11.5–15.5)
Smear Review: NORMAL
WBC: 5.2 10*3/uL (ref 4.0–10.5)
nRBC: 0 % (ref 0.0–0.2)

## 2019-11-07 LAB — BASIC METABOLIC PANEL
Anion gap: 7 (ref 5–15)
BUN: 18 mg/dL (ref 6–20)
CO2: 25 mmol/L (ref 22–32)
Calcium: 8.8 mg/dL — ABNORMAL LOW (ref 8.9–10.3)
Chloride: 109 mmol/L (ref 98–111)
Creatinine, Ser: 1.62 mg/dL — ABNORMAL HIGH (ref 0.61–1.24)
GFR calc Af Amer: 53 mL/min — ABNORMAL LOW (ref >60)
GFR calc non Af Amer: 46 mL/min — ABNORMAL LOW (ref >60)
Glucose, Bld: 134 mg/dL — ABNORMAL HIGH (ref 70–99)
Potassium: 3.9 mmol/L (ref 3.5–5.1)
Sodium: 141 mmol/L (ref 135–145)

## 2019-11-07 LAB — URINALYSIS, ROUTINE W REFLEX MICROSCOPIC
Bilirubin Urine: NEGATIVE
Glucose, UA: 100 mg/dL — AB
Ketones, ur: NEGATIVE mg/dL
Leukocytes,Ua: NEGATIVE
Nitrite: NEGATIVE
Protein, ur: >300 mg/dL — AB
Specific Gravity, Urine: 1.02 (ref 1.005–1.030)
pH: 6.5 (ref 5.0–8.0)

## 2019-11-07 LAB — URINALYSIS, MICROSCOPIC (REFLEX)

## 2019-11-07 MED ORDER — HYDROCHLOROTHIAZIDE 25 MG PO TABS
25.0000 mg | ORAL_TABLET | Freq: Once | ORAL | Status: AC
  Administered 2019-11-07: 25 mg via ORAL
  Filled 2019-11-07: qty 1

## 2019-11-07 MED ORDER — AMLODIPINE BESYLATE 5 MG PO TABS
5.0000 mg | ORAL_TABLET | Freq: Once | ORAL | Status: AC
  Administered 2019-11-07: 5 mg via ORAL
  Filled 2019-11-07: qty 1

## 2019-11-07 NOTE — ED Provider Notes (Signed)
[place] EMERGENCY DEPARTMENT Provider Note   CSN: [id number] Arrival date & time: 11/07/19  0420     History Chief Complaint  Patient presents with  . Hypertension    Douglas Briggs is a 59 y.o. male.  The history is provided by the patient.  Hypertension This is a chronic problem. The current episode started more than 1 week ago. The problem occurs constantly. The problem has not changed since onset.Pertinent negatives include no chest pain, no abdominal pain, no headaches and no shortness of breath. Nothing aggravates the symptoms. Nothing relieves the symptoms. Treatments tried: vinegar. The treatment provided no relief.  Patient woke up and checked his BP and it was elevated so he took vinegar and came here.  No CP, no SOB, no weakness, no numbness, no changes in vision or speech.       Past Medical History:  Diagnosis Date  . Diabetes mellitus without complication ([place])   . Neuropathy   . Stroke [facility])     Patient Active Problem List   Diagnosis Date Noted  . Stroke ([place]) 10/30/2019  . TIA (transient ischemic attack) 10/29/2019  . Stroke-like symptoms 10/29/2019  . Right sided numbness     History reviewed. No pertinent surgical history.     History reviewed. No pertinent family history.  Social History   Tobacco Use  . Smoking status: Never Smoker  . Smokeless tobacco: Never Used  Vaping Use  . Vaping Use: Never used  Substance Use Topics  . Alcohol use: No  . Drug use: Never    Home Medications Prior to Admission medications   Medication Sig Start Date End Date Taking? Authorizing Provider  amLODipine (NORVASC) 5 MG tablet Take 1 tablet (5 mg total) by mouth daily. 10/31/19 11/30/19  Elodia Florence., MD  aspirin EC 81 MG tablet Take 1 tablet (81 mg total) by mouth daily. 10/31/19 10/30/20  Elodia Florence., MD  atorvastatin (LIPITOR) 80 MG tablet Take 1 tablet (80 mg total) by mouth daily. 11/01/19 12/01/19  Elodia Florence., MD   clopidogrel (PLAVIX) 75 MG tablet Take 1 tablet (75 mg total) by mouth daily. 11/01/19 01/30/20  Elodia Florence., MD  gabapentin (NEURONTIN) 100 MG capsule Take 200 mg by mouth at bedtime.    [provider]  losartan (COZAAR) 25 MG tablet Take 1 tablet (25 mg total) by mouth daily. 10/31/19 11/30/19  Elodia Florence., MD  metFORMIN (GLUCOPHAGE) 500 MG tablet Take 500 mg by mouth 2 (two) times daily with a meal.    [provider]  Multiple Vitamin (MULTIVITAMIN WITH MINERALS) TABS tablet Take 1 tablet by mouth daily with breakfast.    [provider]  VITAMIN D PO Take 1 tablet by mouth daily with supper.    [provider]  VITAMIN E PO Take 1 capsule by mouth daily with lunch.    [provider]    Allergies    Patient has no known allergies.  Review of Systems   Review of Systems  Constitutional: Negative for fever.  HENT: Negative for congestion.   Eyes: Negative for visual disturbance.  Respiratory: Negative for cough and shortness of breath.   Cardiovascular: Negative for chest pain, palpitations and leg swelling.  Gastrointestinal: Negative for abdominal pain.  Genitourinary: Negative for difficulty urinating.  Musculoskeletal: Negative for arthralgias.  Skin: Negative for rash.  Neurological: Negative for dizziness, tremors, seizures, syncope, facial asymmetry, speech difficulty, weakness, light-headedness, numbness and  headaches.  Psychiatric/Behavioral: Negative for agitation.  All other systems reviewed and are negative.   Physical Exam Updated Vital Signs BP (!) 199/102 (BP Location: Right Arm)   Pulse 80   Temp 98 F (36.7 C) (Oral)   Resp 16   Ht 5\' 8"  (1.727 m)   Wt 77.6 kg   SpO2 99%   BMI 26.00 kg/m   Physical Exam Vitals and nursing note reviewed.  Constitutional:      General: He is not in acute distress.    Appearance: Normal appearance.  HENT:     Head: Normocephalic and atraumatic.     Nose:  Nose normal.     Mouth/Throat:     Mouth: Mucous membranes are moist.     Pharynx: Oropharynx is clear.  Eyes:     Extraocular Movements: Extraocular movements intact.     Conjunctiva/sclera: Conjunctivae normal.     Pupils: Pupils are equal, round, and reactive to light.  Cardiovascular:     Rate and Rhythm: Normal rate and regular rhythm.     Pulses: Normal pulses.     Heart sounds: Normal heart sounds.  Pulmonary:     Effort: Pulmonary effort is normal.     Breath sounds: Normal breath sounds.  Abdominal:     General: Abdomen is flat. Bowel sounds are normal.     Tenderness: There is no abdominal tenderness. There is no guarding.  Musculoskeletal:        General: Normal range of motion.     Cervical back: Normal range of motion and neck supple.     Right lower leg: No edema.     Left lower leg: No edema.  Skin:    General: Skin is warm and dry.     Capillary Refill: Capillary refill takes less than 2 seconds.  Neurological:     General: No focal deficit present.     Mental Status: He is alert and oriented to person, place, and time.     Cranial Nerves: No cranial nerve deficit.     Deep Tendon Reflexes: Reflexes normal.  Psychiatric:        Mood and Affect: Mood normal.        Behavior: Behavior normal.     ED Results / Procedures / Treatments   Labs (all labs ordered are listed, but only abnormal results are displayed) Results for orders placed or performed during the hospital encounter of 11/07/19  CBC with Differential/Platelet  Result Value Ref Range   WBC 5.2 4.0 - 10.5 K/uL   RBC 4.73 4.22 - 5.81 MIL/uL   Hemoglobin 13.1 13.0 - 17.0 g/dL   HCT 39.3 39 - 52 %   MCV 83.1 80.0 - 100.0 fL   MCH 27.7 26.0 - 34.0 pg   MCHC 33.3 30.0 - 36.0 g/dL   RDW 12.8 11.5 - 15.5 %   Platelets 144 (L) 150 - 400 K/uL   nRBC 0.0 0.0 - 0.2 %   Neutrophils Relative % PENDING %   Neutro Abs PENDING 1.7 - 7.7 K/uL   Band Neutrophils PENDING %   Lymphocytes Relative PENDING %    Lymphs Abs PENDING 0.7 - 4.0 K/uL   Monocytes Relative PENDING %   Monocytes Absolute PENDING 0 - 1 K/uL   Eosinophils Relative PENDING %   Eosinophils Absolute PENDING 0 - 0 K/uL   Basophils Relative PENDING %   Basophils Absolute PENDING 0 - 0 K/uL   WBC Morphology PENDING    RBC  Morphology PENDING    Smear Review PENDING    Other PENDING %   nRBC PENDING 0 /100 WBC   Metamyelocytes Relative PENDING %   Myelocytes PENDING %   Promyelocytes Relative PENDING %   Blasts PENDING %   Immature Granulocytes PENDING %   Abs Immature Granulocytes PENDING 0.00 - 0.07 K/uL   CT Angio Head W or Wo Contrast  Result Date: 10/29/2019 CLINICAL DATA:  Intermittent right-sided numbness and weakness EXAM: CT ANGIOGRAPHY HEAD AND NECK TECHNIQUE: Multidetector CT imaging of the head and neck was performed using the standard protocol during bolus administration of intravenous contrast. Multiplanar CT image reconstructions and MIPs were obtained to evaluate the vascular anatomy. Carotid stenosis measurements (when applicable) are obtained utilizing NASCET criteria, using the distal internal carotid diameter as the denominator. CONTRAST:  11mL OMNIPAQUE IOHEXOL 350 MG/ML SOLN COMPARISON:  Head CT from earlier today FINDINGS: CT HEAD FINDINGS Brain: No evidence of acute infarction, hemorrhage, hydrocephalus, extra-axial collection or mass lesion/mass effect. Vascular: No hyperdense vessel or unexpected calcification. Skull: Normal. Negative for fracture or focal lesion. Sinuses: No acute finding Orbits: Negative Review of the MIP images confirms the above findings CTA NECK FINDINGS Aortic arch: Atherosclerotic plaque at the arch. There is partial obscuration due to streak from intravenous contrast. Right carotid system: Low-density atheromatous plaque along the distal common carotid. Mild plaque at the bifurcation. No flow limiting stenosis or ulceration. Left carotid system: Mild atheromatous changes without  stenosis or ulceration. Vertebral arteries: No proximal subclavian stenosis. 50% narrowing at the proximal right vertebral artery. No beading or flow limiting stenosis. Skeleton: No acute finding Other neck: Negative Upper chest: Negative Review of the MIP images confirms the above findings CTA HEAD FINDINGS Anterior circulation: Calcified plaque on the carotid siphons with severe left cavernous and supraclinoid ICA narrowing to the degree that the lumen is difficult to accurately measure. The left ICA is smaller than the right from hypoplastic left A1 segment and possibly from superimposed underfilling. No branch occlusion, beading, or aneurysm. There is a moderate right M2/3 segment stenosis best seen on sagittal reformats. Posterior circulation: Vertebral and basilar arteries are smooth and widely patent. High-grade left P2 segment narrowing, see sagittal reformats. Negative for aneurysm Venous sinuses: Diffusely patent Anatomic variants: As above Review of the MIP images confirms the above findings IMPRESSION: 1. 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 2. Advanced left P2 segment stenosis. 3. 50% right V1 segment stenosis. Electronically Signed   By: Monte Fantasia M.D.   On: 10/29/2019 06:32   DG Chest 1 View  Result Date: 10/29/2019 CLINICAL DATA:  Stroke-like symptoms EXAM: CHEST  1 VIEW COMPARISON:  05/28/2009 FINDINGS: The heart size and mediastinal contours are within normal limits. Both lungs are clear. The visualized skeletal structures are unremarkable. IMPRESSION: No active disease. Electronically Signed   By: Randa Ngo M.D.   On: 10/29/2019 18:50   CT Head Wo Contrast  Result Date: 10/28/2019 CLINICAL DATA:  Right side numbness EXAM: CT HEAD WITHOUT CONTRAST TECHNIQUE: Contiguous axial images were obtained from the base of the skull through the vertex without intravenous contrast. COMPARISON:  None. FINDINGS: Brain: No acute intracranial abnormality. Specifically, no  hemorrhage, hydrocephalus, mass lesion, acute infarction, or significant intracranial injury. Vascular: No hyperdense vessel or unexpected calcification. Skull: No acute calvarial abnormality. Sinuses/Orbits: Visualized paranasal sinuses and mastoids clear. Orbital soft tissues unremarkable. Other: None IMPRESSION: No acute intracranial abnormality. Electronically Signed   By: Rolm Baptise M.D.   On:  10/28/2019 22:36   CT Angio Neck W and/or Wo Contrast  Result Date: 10/29/2019 CLINICAL DATA:  Intermittent right-sided numbness and weakness EXAM: CT ANGIOGRAPHY HEAD AND NECK TECHNIQUE: Multidetector CT imaging of the head and neck was performed using the standard protocol during bolus administration of intravenous contrast. Multiplanar CT image reconstructions and MIPs were obtained to evaluate the vascular anatomy. Carotid stenosis measurements (when applicable) are obtained utilizing NASCET criteria, using the distal internal carotid diameter as the denominator. CONTRAST:  18mL OMNIPAQUE IOHEXOL 350 MG/ML SOLN COMPARISON:  Head CT from earlier today FINDINGS: CT HEAD FINDINGS Brain: No evidence of acute infarction, hemorrhage, hydrocephalus, extra-axial collection or mass lesion/mass effect. Vascular: No hyperdense vessel or unexpected calcification. Skull: Normal. Negative for fracture or focal lesion. Sinuses: No acute finding Orbits: Negative Review of the MIP images confirms the above findings CTA NECK FINDINGS Aortic arch: Atherosclerotic plaque at the arch. There is partial obscuration due to streak from intravenous contrast. Right carotid system: Low-density atheromatous plaque along the distal common carotid. Mild plaque at the bifurcation. No flow limiting stenosis or ulceration. Left carotid system: Mild atheromatous changes without stenosis or ulceration. Vertebral arteries: No proximal subclavian stenosis. 50% narrowing at the proximal right vertebral artery. No beading or flow limiting stenosis.  Skeleton: No acute finding Other neck: Negative Upper chest: Negative Review of the MIP images confirms the above findings CTA HEAD FINDINGS Anterior circulation: Calcified plaque on the carotid siphons with severe left cavernous and supraclinoid ICA narrowing to the degree that the lumen is difficult to accurately measure. The left ICA is smaller than the right from hypoplastic left A1 segment and possibly from superimposed underfilling. No branch occlusion, beading, or aneurysm. There is a moderate right M2/3 segment stenosis best seen on sagittal reformats. Posterior circulation: Vertebral and basilar arteries are smooth and widely patent. High-grade left P2 segment narrowing, see sagittal reformats. Negative for aneurysm Venous sinuses: Diffusely patent Anatomic variants: As above Review of the MIP images confirms the above findings IMPRESSION: 1. 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 2. Advanced left P2 segment stenosis. 3. 50% right V1 segment stenosis. Electronically Signed   By: Monte Fantasia M.D.   On: 10/29/2019 06:32   MR BRAIN WO CONTRAST  Result Date: 10/29/2019 CLINICAL DATA:  Focal neuro deficit, greater than 6 hours, stroke suspected. EXAM: MRI HEAD WITHOUT CONTRAST TECHNIQUE: Multiplanar, multiecho pulse sequences of the brain and surrounding structures were obtained without intravenous contrast. COMPARISON:  Noncontrast head CT 10/28/2019, noncontrast head CT and CT angiogram head/neck performed earlier the same day 10/29/2019. FINDINGS: Brain: The examination is intermittently motion degraded. Most notably, there is moderate motion degradation of the axial T2/FLAIR sequence. There is a 10 mm focus of diffusion weighted hyperintensity within the right frontal white matter which shows subtle T2 shine through may reflect a late subacute infarct (series 2, image 32). There are subcentimeter acute/early subacute infarcts within the right frontal operculum (series 2, image 29),  within the left temporoparietal periventricular white matter (series 2, image 29), within the right frontoparietal white matter (series 2, image 36) and within the midline cerebellum (series 2, image 18). Corresponding T2/FLAIR hyperintensity at these sites. Background mild scattered T2/FLAIR hyperintensity within the cerebral white matter is nonspecific, but likely reflects chronic small vessel ischemic disease. Chronic lacunar infarcts within the thalami and right cerebellar white matter. Cerebral volume is normal for age. No evidence of intracranial mass. No chronic intracranial blood products. No extra-axial fluid collection. No midline shift. Vascular: Expected  proximal arterial flow voids. Skull and upper cervical spine: No focal marrow lesion. Sinuses/Orbits: Visualized orbits show no acute finding. Mild ethmoid and maxillary sinus mucosal thickening. No significant mastoid effusion. IMPRESSION: Motion degraded examination as described. 10 mm late subacute infarct within the right frontal white matter. Several additional subcentimeter acute/early subacute infarcts within the right frontal operculum, left temporoparietal periventricular white matter, right frontoparietal white matter and within the midline cerebellum as described. Background mild chronic small vessel ischemic changes within the cerebral white matter. Chronic thalamic and right cerebellar lacunar infarcts. Mild ethmoid and maxillary sinus mucosal thickening. Electronically Signed   By: Kellie Simmering DO   On: 10/29/2019 19:16   VAS [place] ABI WITH/WO TBI  Result Date: 10/31/2019 LOWER EXTREMITY DOPPLER STUDY Indications: Claudication, and Incidental finding of left SFA occlusion during              venous duplex. High Risk Factors: Diabetes. Other Factors: Admitted with stroke.  Comparison Study: No prior study on file Performing Technologist: Sharion Dove RVS  Examination Guidelines: A complete evaluation includes at minimum, Doppler waveform  signals and systolic blood pressure reading at the level of bilateral brachial, anterior tibial, and posterior tibial arteries, when vessel segments are accessible. Bilateral testing is considered an integral part of a complete examination. Photoelectric Plethysmograph (PPG) waveforms and toe systolic pressure readings are included as required and additional duplex testing as needed. Limited examinations for reoccurring indications may be performed as noted.  ABI Findings: +---------+------------------+-----+---------+--------+ Right    Rt Pressure (mmHg)IndexWaveform Comment  +---------+------------------+-----+---------+--------+ Brachial 180                    triphasic         +---------+------------------+-----+---------+--------+ PTA      156               0.87 biphasic          +---------+------------------+-----+---------+--------+ DP       117               0.65 biphasic          +---------+------------------+-----+---------+--------+ Great Toe78                0.43                   +---------+------------------+-----+---------+--------+ +---------+------------------+-----+---------+-------+ Left     Lt Pressure (mmHg)IndexWaveform Comment +---------+------------------+-----+---------+-------+ Brachial 174                    triphasic        +---------+------------------+-----+---------+-------+ PTA      119               0.66 biphasic         +---------+------------------+-----+---------+-------+ DP       98                0.54 biphasic         +---------+------------------+-----+---------+-------+ Great Toe86                0.48                  +---------+------------------+-----+---------+-------+ +-------+-----------+-----------+------------+------------+ ABI/TBIToday's ABIToday's TBIPrevious ABIPrevious TBI +-------+-----------+-----------+------------+------------+ Right  0.87       0.43                                 +-------+-----------+-----------+------------+------------+ Left   0.66  0.48                                +-------+-----------+-----------+------------+------------+  Summary: Right: Resting right ankle-brachial index indicates mild right lower extremity arterial disease. The right toe-brachial index is abnormal. Left: Resting left ankle-brachial index indicates moderate left lower extremity arterial disease. The left toe-brachial index is abnormal.  *See table(s) above for measurements and observations.  Electronically signed by Ruta Hinds MD on 10/31/2019 at 7:15:01 PM.    Final    ECHOCARDIOGRAM COMPLETE  Result Date: 10/30/2019    ECHOCARDIOGRAM REPORT   Patient Name:   Douglas Briggs Date of Exam: 10/30/2019 Medical Rec #:  [id number]   Height:       68.0 in Accession #:    [id number]  Weight:       173.0 lb Date of Birth:  25-Jul-1960  BSA:          1.922 m Patient Age:    28 years    BP:           160/97 mmHg Patient Gender: M           HR:           68 bpm. Exam Location:  Inpatient Procedure: 2D Echo Indications:    stroke like symptoms  History:        Patient has no prior history of Echocardiogram examinations.                 Risk Factors:Hypertension and Diabetes.  Sonographer:    Johny Chess Referring Phys: [phone] A CALDWELL POWELL Egan  1. Left ventricular ejection fraction, by estimation, is 50 to 55%. The left ventricle has normal function. The left ventricle has no regional wall motion abnormalities. There is mild left ventricular hypertrophy. Left ventricular diastolic parameters are consistent with Grade I diastolic dysfunction (impaired relaxation).  2. Right ventricular systolic function is normal. The right ventricular size is normal.  3. The mitral valve is normal in structure. No evidence of mitral valve regurgitation. No evidence of mitral stenosis.  4. The aortic valve is tricuspid. Aortic valve regurgitation is not visualized. No aortic stenosis is present.   5. The inferior vena cava is normal in size with greater than 50% respiratory variability, suggesting right atrial pressure of 3 mmHg. FINDINGS  Left Ventricle: Left ventricular ejection fraction, by estimation, is 50 to 55%. The left ventricle has normal function. The left ventricle has no regional wall motion abnormalities. The left ventricular internal cavity size was normal in size. There is  mild left ventricular hypertrophy. Left ventricular diastolic parameters are consistent with Grade I diastolic dysfunction (impaired relaxation). Indeterminate filling pressures. Right Ventricle: The right ventricular size is normal. No increase in right ventricular wall thickness. Right ventricular systolic function is normal. Left Atrium: Left atrial size was normal in size. Right Atrium: Right atrial size was normal in size. Pericardium: There is no evidence of pericardial effusion. Mitral Valve: The mitral valve is normal in structure. No evidence of mitral valve regurgitation. No evidence of mitral valve stenosis. Tricuspid Valve: The tricuspid valve is normal in structure. Tricuspid valve regurgitation is not demonstrated. No evidence of tricuspid stenosis. Aortic Valve: The aortic valve is tricuspid. Aortic valve regurgitation is not visualized. No aortic stenosis is present. Aortic valve mean gradient measures 3.7 mmHg. Aortic valve peak gradient measures 6.7 mmHg. Aortic valve area, by VTI measures 3.20 cm. Pulmonic Valve:  The pulmonic valve was not well visualized. Pulmonic valve regurgitation is not visualized. No evidence of pulmonic stenosis. Aorta: The aortic root is normal in size and structure. Venous: The inferior vena cava is normal in size with greater than 50% respiratory variability, suggesting right atrial pressure of 3 mmHg. IAS/Shunts: No atrial level shunt detected by color flow Doppler.  LEFT VENTRICLE PLAX 2D LVIDd:         4.80 cm  Diastology LVIDs:         3.60 cm  LV e' medial:   4.68 cm/s LV  PW:         1.20 cm  LV E/e' medial: 15.8 LV IVS:        1.00 cm LVOT diam:     2.10 cm LV SV:         90 LV SV Index:   47 LVOT Area:     3.46 cm  RIGHT VENTRICLE             IVC RV S prime:     14.80 cm/s  IVC diam: 1.70 cm TAPSE (M-mode): 1.8 cm LEFT ATRIUM             Index       RIGHT ATRIUM           Index LA diam:        4.20 cm 2.19 cm/m  RA Area:     11.40 cm LA Vol (A2C):   54.9 ml 28.57 ml/m RA Volume:   24.20 ml  12.59 ml/m LA Vol (A4C):   44.6 ml 23.21 ml/m LA Biplane Vol: 50.5 ml 26.28 ml/m  AORTIC VALVE AV Area (Vmax):    3.25 cm AV Area (Vmean):   3.20 cm AV Area (VTI):     3.20 cm AV Vmax:           128.95 cm/s AV Vmean:          93.327 cm/s AV VTI:            0.283 m AV Peak Grad:      6.7 mmHg AV Mean Grad:      3.7 mmHg LVOT Vmax:         121.00 cm/s LVOT Vmean:        86.100 cm/s LVOT VTI:          0.261 m LVOT/AV VTI ratio: 0.92  AORTA Ao Root diam: 3.10 cm Ao Asc diam:  3.20 cm MITRAL VALVE MV Area (PHT): 3.31 cm     SHUNTS MV Decel Time: 229 msec     Systemic VTI:  0.26 m MV E velocity: 74.10 cm/s   Systemic Diam: 2.10 cm MV A velocity: 100.00 cm/s MV E/A ratio:  0.74 MV A Prime:    7.9 cm/s Carlyle Dolly MD Electronically signed by Carlyle Dolly MD Signature Date/Time: 10/30/2019/1:28:48 PM    Final    VAS [place] LOWER EXTREMITY VENOUS (DVT)  Result Date: 10/31/2019  Lower Venous DVTStudy Indications: Edema, and stroke.  Comparison Study: No prior study on file for comparison Performing Technologist: Sharion Dove RVS  Examination Guidelines: A complete evaluation includes B-mode imaging, spectral Doppler, color Doppler, and power Doppler as needed of all accessible portions of each vessel. Bilateral testing is considered an integral part of a complete examination. Limited examinations for reoccurring indications may be performed as noted. The reflux portion of the exam is performed with the patient in reverse Trendelenburg.   +---------+---------------+---------+-----------+----------+--------------+ RIGHT  CompressibilityPhasicitySpontaneityPropertiesThrombus Aging +---------+---------------+---------+-----------+----------+--------------+ CFV      Full           Yes      Yes                                 +---------+---------------+---------+-----------+----------+--------------+ SFJ      Full                                                        +---------+---------------+---------+-----------+----------+--------------+ FV Prox  Full                                                        +---------+---------------+---------+-----------+----------+--------------+ FV Mid   Full                                                        +---------+---------------+---------+-----------+----------+--------------+ FV DistalFull                                                        +---------+---------------+---------+-----------+----------+--------------+ PFV      Full                                                        +---------+---------------+---------+-----------+----------+--------------+ POP      Full           Yes      Yes                                 +---------+---------------+---------+-----------+----------+--------------+ PTV      Full                                                        +---------+---------------+---------+-----------+----------+--------------+ PERO     Full                                                        +---------+---------------+---------+-----------+----------+--------------+ SSV      Full                                         thickened      +---------+---------------+---------+-----------+----------+--------------+   +---------+---------------+---------+-----------+----------+--------------+  LEFT     CompressibilityPhasicitySpontaneityPropertiesThrombus Aging  +---------+---------------+---------+-----------+----------+--------------+ CFV      Full           Yes      Yes                                 +---------+---------------+---------+-----------+----------+--------------+ SFJ      Full                                                        +---------+---------------+---------+-----------+----------+--------------+ FV Prox  Full                                                        +---------+---------------+---------+-----------+----------+--------------+ FV Mid   Full                                                        +---------+---------------+---------+-----------+----------+--------------+ FV DistalFull                                                        +---------+---------------+---------+-----------+----------+--------------+ PFV      Full                                                        +---------+---------------+---------+-----------+----------+--------------+ POP      Full                                                        +---------+---------------+---------+-----------+----------+--------------+ PTV      Full                                                        +---------+---------------+---------+-----------+----------+--------------+ PERO     Full                                                        +---------+---------------+---------+-----------+----------+--------------+ SSV      Full  thickened      +---------+---------------+---------+-----------+----------+--------------+ Incidentally, 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 Cannot rule out thrombus.    Summary: BILATERAL: - No evidence of deep vein thrombosis seen in the lower extremities, bilaterally. 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 Cannot rule out thrombus. Interstitial fluid noted throughout calf.  *See table(s) above for measurements and observations. Electronically signed by Ruta Hinds MD on 10/31/2019 at 7:16:07 PM.    Final     EKG None  Radiology No results found.  Procedures Procedures (including critical care time)  Medications Ordered in ED Medications  amLODipine (NORVASC) tablet 5 mg (5 mg Oral Given 11/07/19 0448)  hydrochlorothiazide (HYDRODIURIL) tablet 25 mg (25 mg Oral Given 11/07/19 0449)    ED Course  I have reviewed the triage vital signs and the nursing notes.  Pertinent labs & imaging results that were available during my care of the patient were reviewed by me and considered in my medical decision making (see chart for details).   Have instructed the patient to follow up with his PMD and renal for titration of his BP medications.  He will likely need a 3rd agent given his renal function.  No symptoms at this time.  BP is improved.    Douglas Briggs was evaluated in Emergency Department on 11/07/2019 for the symptoms described in the history of present illness. He was evaluated in the context of the global COVID-19 pandemic, which necessitated consideration that the patient might be at risk for infection with the SARS-CoV-2 virus that causes COVID-19. Institutional protocols and algorithms that pertain to the evaluation of patients at risk for COVID-19 are in a state of rapid change based on information released by regulatory bodies including the CDC and federal and state organizations. These policies and algorithms were followed during the patient's care in the ED.  Final Clinical Impression(s) / ED Diagnoses Return for intractable cough, coughing up blood,fevers >100.4 unrelieved by medication, shortness of breath, intractable vomiting, chest pain, shortness of breath, weakness,numbness, changes in  speech, facial asymmetry,abdominal pain, passing out,Inability to tolerate liquids or food, cough, altered mental status or any concerns. No signs of systemic illness or infection. The patient is nontoxic-appearing on exam and vital signs are within normal limits.   I have reviewed the triage vital signs and the nursing notes. Pertinent labs &imaging results that were available during my care of the patient were reviewed by me and considered in my medical decision making (see chart for details).After history, exam, and medical workup I feel the patient has beenappropriately medically screened and is safe for discharge home. Pertinent diagnoses were discussed with the patient. Patient was given return precautions.   Lindley Stachnik, MD 11/07/19 [phone]

## 2019-11-07 NOTE — ED Triage Notes (Addendum)
Pt reports "I woke up and my feet felt tight"; pt took his BP and was 204/105; took vinegar this morning to help it come down; pt has hx of TIA but does not display signs/symptoms of stroke; EDP Palumbo at bedside

## 2019-11-08 LAB — PATHOLOGIST SMEAR REVIEW: Path Review: REACTIVE

## 2019-11-10 ENCOUNTER — Encounter (HOSPITAL_BASED_OUTPATIENT_CLINIC_OR_DEPARTMENT_OTHER): Payer: Self-pay | Admitting: Emergency Medicine

## 2019-11-10 ENCOUNTER — Other Ambulatory Visit: Payer: Self-pay

## 2019-11-10 ENCOUNTER — Emergency Department (HOSPITAL_BASED_OUTPATIENT_CLINIC_OR_DEPARTMENT_OTHER)
Admission: EM | Admit: 2019-11-10 | Discharge: 2019-11-10 | Disposition: A | Discharge status: Home / Self Care | Payer: No Typology Code available for payment source | Attending: Emergency Medicine | Admitting: Emergency Medicine

## 2019-11-10 DIAGNOSIS — Z7984 Long term (current) use of oral hypoglycemic drugs: Secondary | ICD-10-CM | POA: Diagnosis not present

## 2019-11-10 DIAGNOSIS — E119 Type 2 diabetes mellitus without complications: Secondary | ICD-10-CM | POA: Diagnosis not present

## 2019-11-10 DIAGNOSIS — R600 Localized edema: Secondary | ICD-10-CM | POA: Insufficient documentation

## 2019-11-10 DIAGNOSIS — I10 Essential (primary) hypertension: Secondary | ICD-10-CM | POA: Insufficient documentation

## 2019-11-10 MED ORDER — LOSARTAN POTASSIUM 25 MG PO TABS: 50.0000 mg | ORAL_TABLET | Freq: Every day | ORAL | 0 refills | Status: DC

## 2019-11-10 MED ORDER — HYDROCHLOROTHIAZIDE 25 MG PO TABS: 25.0000 mg | ORAL_TABLET | Freq: Every day | ORAL | 0 refills | Status: DC

## 2019-11-10 MED ORDER — HYDROCHLOROTHIAZIDE 25 MG PO TABS
25.0000 mg | ORAL_TABLET | Freq: Once | ORAL | Status: AC
  Administered 2019-11-10: 25 mg via ORAL
  Filled 2019-11-10: qty 1

## 2019-11-10 MED ORDER — LOSARTAN POTASSIUM 25 MG PO TABS
50.0000 mg | ORAL_TABLET | Freq: Once | ORAL | Status: AC
  Administered 2019-11-10: 50 mg via ORAL
  Filled 2019-11-10: qty 2

## 2019-11-10 NOTE — ED Triage Notes (Signed)
Returning pt for hypertension and bilateral leg swelling. Having trouble getting into the doctor in an appropriate timeframe based on our D/C instructions. Pt is compliant with meds and effort in follow-up.

## 2019-11-10 NOTE — Discharge Instructions (Signed)
1.  Increase your losartan (Cozaar) dose to 50 mg daily.  You have been started on a new medication called hydrochlorothiazide (HCTZ).  Start this medication daily as prescribed.   2.  Continue your amlodipine 5 mg daily.  If your blood pressure is still greater then 150s/80s by tomorrow evening, increase your amlodipine dose to 10 mg daily.  Continue to monitor your blood pressures twice daily and write them down. 3.  See your doctor as planned to continue your referrals and monitor your blood pressure. 4.  Be sure to take your aspirin and Plavix as prescribed on a regular basis. 5.  Return to the emergency department immediately if you have any problems with strokelike symptoms such as weakness numbness confusion slurred speech visual problems or other concerning symptoms.

## 2019-11-10 NOTE — ED Provider Notes (Signed)
[facility] EMERGENCY DEPARTMENT Provider Note   CSN: [id number] Arrival date & time: 11/10/19  0867     History Chief Complaint  Patient presents with   Hypertension   Leg Swelling    Douglas Briggs is a 59 y.o. male.  HPI Patient reports his blood pressures are remaining elevated despite taking medications as prescribed.  Patient was newly diagnosed with hypertension and stroke earlier this month during hospitalization.  He reports he has been feeling well.  All of his stroke symptoms have resolved.  He is concerned because he is monitoring his blood pressures and his systolic blood pressures are in the 180s periodically and his diastolic pressures are up to the 100s.  He has been compliant with combination of amlodipine 5 mg daily and Cozaar 50 mg daily.  He has his PCP appointment coming up within a week.  He also reports swelling in his ankles and feet.  He reports he did have problems with swelling before his diagnosis of hypertension and CVA.  No pain in the calves.  He reports they are fairly consistently swollen, swollen even when he wakes up in the morning after elevating.  This problem has been ongoing and not alleviated by new medications.  Patient has not been on any diuretics.  Patient was started on amlodipine but he reports that this preceded starting that medication.  Patient denies any headaches, visual changes.  No strokelike symptoms weakness numbness or incoordination.  No chest pain or shortness of breath.  No difficulty with urination.  No decreased urine output no pain no burning no dark urine.    Past Medical History:  Diagnosis Date   Diabetes mellitus without complication ([place])    Neuropathy    Stroke [facility])     Patient Active Problem List   Diagnosis Date Noted   Stroke ([place]) 10/30/2019   TIA (transient ischemic attack) 10/29/2019   Stroke-like symptoms 10/29/2019   Right sided numbness     History reviewed. No pertinent surgical  history.     History reviewed. No pertinent family history.  Social History   Tobacco Use   Smoking status: Never Smoker   Smokeless tobacco: Never Used  Vaping Use   Vaping Use: Never used  Substance Use Topics   Alcohol use: No   Drug use: Never    Home Medications Prior to Admission medications   Medication Sig Start Date End Date Taking? Authorizing Provider  amLODipine (NORVASC) 5 MG tablet Take 1 tablet (5 mg total) by mouth daily. 10/31/19 11/30/19  Elodia Florence., MD  aspirin EC 81 MG tablet Take 1 tablet (81 mg total) by mouth daily. 10/31/19 10/30/20  Elodia Florence., MD  atorvastatin (LIPITOR) 80 MG tablet Take 1 tablet (80 mg total) by mouth daily. 11/01/19 12/01/19  Elodia Florence., MD  clopidogrel (PLAVIX) 75 MG tablet Take 1 tablet (75 mg total) by mouth daily. 11/01/19 01/30/20  Elodia Florence., MD  gabapentin (NEURONTIN) 100 MG capsule Take 200 mg by mouth at bedtime.    [provider]  hydrochlorothiazide (HYDRODIURIL) 25 MG tablet Take 1 tablet (25 mg total) by mouth daily. 11/10/19   Charlesetta Shanks, MD  losartan (COZAAR) 25 MG tablet Take 2 tablets (50 mg total) by mouth daily. 11/10/19 12/10/19  Charlesetta Shanks, MD  metFORMIN (GLUCOPHAGE) 500 MG tablet Take 500 mg by mouth 2 (two) times daily with a meal.    [provider]  Multiple Vitamin (MULTIVITAMIN WITH  MINERALS) TABS tablet Take 1 tablet by mouth daily with breakfast.    [provider]  VITAMIN D PO Take 1 tablet by mouth daily with supper.    [provider]  VITAMIN E PO Take 1 capsule by mouth daily with lunch.    [provider]    Allergies    Patient has no known allergies.  Review of Systems   Review of Systems 10 systems reviewed and negative except as per HPI  Physical Exam Updated Vital Signs BP (!) 188/100 (BP Location: Right Arm)    Pulse 77    Temp (!) 97.5 F (36.4 C) (Oral)    Resp 16    SpO2 100%   Physical  Exam Constitutional:      Appearance: He is well-developed.     Comments: Alert and well in appearance.  Mental status clear.  No respiratory distress.  HENT:     Head: Normocephalic and atraumatic.  Eyes:     Extraocular Movements: Extraocular movements intact.     Pupils: Pupils are equal, round, and reactive to light.  Cardiovascular:     Rate and Rhythm: Normal rate and regular rhythm.     Heart sounds: Normal heart sounds. No murmur heard.   Pulmonary:     Effort: Pulmonary effort is normal.     Breath sounds: Normal breath sounds.  Abdominal:     General: Bowel sounds are normal. There is no distension.     Palpations: Abdomen is soft.     Tenderness: There is no abdominal tenderness.  Musculoskeletal:        General: Normal range of motion.     Cervical back: Neck supple.     Comments: Patient has 1+ edema bilateral ankles.  Does not extend above the ankles.  Feet are not notably edematous.  No large edema of the lower legs.  Calves soft and pliable.  Skin:    General: Skin is warm and dry.  Neurological:     General: No focal deficit present.     Mental Status: He is alert and oriented to person, place, and time.     GCS: GCS eye subscore is 4. GCS verbal subscore is 5. GCS motor subscore is 6.     Cranial Nerves: No cranial nerve deficit.     Coordination: Coordination normal.  Psychiatric:        Mood and Affect: Mood normal.     ED Results / Procedures / Treatments   Labs (all labs ordered are listed, but only abnormal results are displayed) Labs Reviewed - No data to display  EKG None  Radiology No results found.  Procedures Procedures (including critical care time)  Medications Ordered in ED Medications  losartan (COZAAR) tablet 50 mg (50 mg Oral Given 11/10/19 0751)  hydrochlorothiazide (HYDRODIURIL) tablet 25 mg (25 mg Oral Given 11/10/19 0751)    ED Course  I have reviewed the triage vital signs and the nursing notes.  Pertinent labs & imaging  results that were available during my care of the patient were reviewed by me and considered in my medical decision making (see chart for details).    MDM Rules/Calculators/A&P                         Patient was diagnosed with subacute CVA at the beginning of the month.  He had hypertension which was treated with losartan 25 mg daily and amlodipine 5 mg daily.  He  has been compliant with his aspirin and Plavix.  Patient does not have any symptoms today but is concerned due to persisting hypertension.  He has PCP follow-up but not for another week.  Patient was seen in the emergency department 2 days ago and at that time renal function unchanged from baseline during his hospitalization.  At this time, do not think repeat renal function is indicated.  There was no dose change to his medications at the time of his visit.  Due to persisting hypertension documented by the patient, will increase his medication doses.  Patient is counseled on increasing Cozaar to 50 mg daily.  Given his patient has persistent ankle edema and stable renal function will add hydrochlorothiazide for diuresis and BP control.  Patient is counseled to continue his amlodipine at 5 mg daily.  Patient will monitor his blood pressures with instructions included to increase amlodipine if pressures remain elevated within 24 hours of medication changes.  Return precautions reviewed.  Patient has follow-up plan in place.  Final Clinical Impression(s) / ED Diagnoses Final diagnoses:  Essential hypertension  Bilateral lower extremity edema    Rx / DC Orders ED Discharge Orders         Ordered    losartan (COZAAR) 25 MG tablet  Daily     Discontinue  Reprint     11/10/19 0745    hydrochlorothiazide (HYDRODIURIL) 25 MG tablet  Daily     Discontinue  Reprint     11/10/19 0745           Charlesetta Shanks, MD 11/10/19 [id number]

## 2019-11-22 ENCOUNTER — Ambulatory Visit (INDEPENDENT_AMBULATORY_CARE_PROVIDER_SITE_OTHER): Payer: No Typology Code available for payment source | Admitting: Cardiology

## 2019-11-22 ENCOUNTER — Other Ambulatory Visit: Payer: Self-pay

## 2019-11-22 ENCOUNTER — Encounter: Payer: Self-pay | Admitting: Cardiology

## 2019-11-22 VITALS — BP 170/90 | HR 86 | Ht 68.0 in | Wt 167.0 lb

## 2019-11-22 DIAGNOSIS — E1142 Type 2 diabetes mellitus with diabetic polyneuropathy: Secondary | ICD-10-CM | POA: Diagnosis not present

## 2019-11-22 DIAGNOSIS — I10 Essential (primary) hypertension: Secondary | ICD-10-CM

## 2019-11-22 DIAGNOSIS — IMO0002 Reserved for concepts with insufficient information to code with codable children: Secondary | ICD-10-CM

## 2019-11-22 DIAGNOSIS — E1165 Type 2 diabetes mellitus with hyperglycemia: Secondary | ICD-10-CM

## 2019-11-22 DIAGNOSIS — I7090 Unspecified atherosclerosis: Secondary | ICD-10-CM

## 2019-11-22 HISTORY — DX: Unspecified atherosclerosis: I70.90

## 2019-11-22 HISTORY — DX: Essential (primary) hypertension: I10

## 2019-11-22 MED ORDER — AMLODIPINE BESYLATE 10 MG PO TABS: 10.0000 mg | ORAL_TABLET | Freq: Every day | ORAL | 1 refills | Status: DC

## 2019-11-22 NOTE — Progress Notes (Signed)
Cardiology Office Note:    Date:  11/22/2019   ID:  Douglas Briggs, DOB 09-29-1960, MRN [id number]  PCP:  Rubie Maid, MD  Cardiologist:  Jenean Lindau, MD   Referring MD: Charlesetta Shanks, MD    ASSESSMENT:    1. Uncontrolled type 2 diabetes mellitus with diabetic polyneuropathy, without long-term current use of insulin ([place])   2. Essential hypertension   3. Atherosclerotic vascular disease    PLAN:    In order of problems listed above:  1. Secondary prevention stressed with the patient.  Importance of compliance with diet medication stressed and he vocalized understanding. 2. Cerebrovascular disease: Patient is on appropriate therapy with statins.  His blood pressure needs to be optimized. 3. Essential hypertension: Blood pressure is elevated.  Diet was emphasized including salt intake issues and he vocalized understanding and promises to do better.  I doubled his amlodipine to 10 mg daily.  He has a blood pressure machine at home and he will keep a track of his blood pressures twice a day. 4. Mixed dyslipidemia: On statin therapy and we will recheck this when he comes for follow-up in a month. 5. Diabetes mellitus: Diet was emphasized and this is followed by his primary care physician.  Follow-up appointment in a month or earlier if he has any concerns.  Patient had multiple questions which were answered to his satisfaction.   Medication Adjustments/Labs and Tests Ordered: Current medicines are reviewed at length with the patient today.  Concerns regarding medicines are outlined above.  No orders of the defined types were placed in this encounter.  No orders of the defined types were placed in this encounter.    History of Present Illness:    Douglas Briggs is a 59 y.o. male who is being seen today for the evaluation of cerebral vascular atherosclerosis, essential hypertension diabetes mellitus and mixed dyslipidemia at the request of Charlesetta Shanks, MD.  Patient is a  pleasant 59 year old male.  He has past medical history of essential hypertension dyslipidemia diabetes mellitus and stroke.  He is here for blood pressure management.  He denies any problems at this time and takes care of activities of daily living.  No chest pain orthopnea or PND.  At the time of my evaluation, the patient is alert awake oriented and in no distress.  He has had a stroke recently and recovering.  He also has significant renal insufficiency.  At the time of my evaluation, the patient is alert awake oriented and in no distress.  Past Medical History:  Diagnosis Date  . Diabetes mellitus without complication ([place])   . Neuropathy   . Stroke [facility])     History reviewed. No pertinent surgical history.  Current Medications: Current Meds  Medication Sig  . amLODipine (NORVASC) 5 MG tablet Take 1 tablet (5 mg total) by mouth daily.  Marland Kitchen aspirin EC 81 MG tablet Take 1 tablet (81 mg total) by mouth daily.  Marland Kitchen atorvastatin (LIPITOR) 80 MG tablet Take 1 tablet (80 mg total) by mouth daily.  . clopidogrel (PLAVIX) 75 MG tablet Take 1 tablet (75 mg total) by mouth daily.  Marland Kitchen gabapentin (NEURONTIN) 100 MG capsule Take 100 mg by mouth at bedtime.   . hydrochlorothiazide (HYDRODIURIL) 25 MG tablet Take 1 tablet (25 mg total) by mouth daily.  Marland Kitchen losartan (COZAAR) 25 MG tablet Take 2 tablets (50 mg total) by mouth daily.  . metFORMIN (GLUCOPHAGE) 500 MG tablet Take 500 mg by mouth 2 (two) times daily with  a meal.  . Multiple Vitamin (MULTIVITAMIN WITH MINERALS) TABS tablet Take 1 tablet by mouth daily with breakfast.  . VITAMIN D PO Take 1 tablet by mouth daily with supper.  Marland Kitchen VITAMIN E PO Take 1 capsule by mouth daily with lunch.     Allergies:   Patient has no known allergies.   Social History   Socioeconomic History  . Marital status: Married    Spouse name: Not on file  . Number of children: Not on file  . Years of education: Not on file  . Highest education level: Not on file   Occupational History  . Not on file  Tobacco Use  . Smoking status: Never Smoker  . Smokeless tobacco: Never Used  Vaping Use  . Vaping Use: Never used  Substance and Sexual Activity  . Alcohol use: No  . Drug use: Never  . Sexual activity: Not on file  Other Topics Concern  . Not on file  Social History Narrative  . Not on file   Social Determinants of Health   Financial Resource Strain:   . Difficulty of Paying Living Expenses:   Food Insecurity:   . Worried About Charity fundraiser in the Last Year:   . Arboriculturist in the Last Year:   Transportation Needs:   . Film/video editor (Medical):   Marland Kitchen Lack of Transportation (Non-Medical):   Physical Activity:   . Days of Exercise per Week:   . Minutes of Exercise per Session:   Stress:   . Feeling of Stress :   Social Connections:   . Frequency of Communication with Friends and Family:   . Frequency of Social Gatherings with Friends and Family:   . Attends Religious Services:   . Active Member of Clubs or Organizations:   . Attends Archivist Meetings:   Marland Kitchen Marital Status:      Family History: The patient's family history is not on file.  ROS:   Please see the history of present illness.    All other systems reviewed and are negative.  EKGs/Labs/Other Studies Reviewed:    The following studies were reviewed today: IMPRESSIONS    1. Left ventricular ejection fraction, by estimation, is 50 to 55%. The  left ventricle has normal function. The left ventricle has no regional  wall motion abnormalities. There is mild left ventricular hypertrophy.  Left ventricular diastolic parameters  are consistent with Grade I diastolic dysfunction (impaired relaxation).  2. Right ventricular systolic function is normal. The right ventricular  size is normal.  3. The mitral valve is normal in structure. No evidence of mitral valve  regurgitation. No evidence of mitral stenosis.  4. The aortic valve is  tricuspid. Aortic valve regurgitation is not  visualized. No aortic stenosis is present.  5. The inferior vena cava is normal in size with greater than 50%  respiratory variability, suggesting right atrial pressure of 3 mmHg.    Recent Labs: 10/31/2019: ALT 17; Magnesium 1.9 11/07/2019: BUN 18; Creatinine, Ser 1.62; Hemoglobin 13.1; Platelets 144; Potassium 3.9; Sodium 141  Recent Lipid Panel    Component Value Date/Time   CHOL 315 (H) 10/30/2019 0350   TRIG 165 (H) 10/30/2019 0350   HDL 52 10/30/2019 0350   CHOLHDL 6.1 10/30/2019 0350   VLDL 33 10/30/2019 0350   LDLCALC 230 (H) 10/30/2019 0350    Physical Exam:    VS:  BP (!) 170/90   Pulse 86   Ht 5\' 8"  (  1.727 m)   Wt 167 lb (75.8 kg)   SpO2 98%   BMI 25.39 kg/m     Wt Readings from Last 3 Encounters:  11/22/19 167 lb (75.8 kg)  11/07/19 171 lb (77.6 kg)  10/28/19 173 lb (78.5 kg)     GEN: Patient is in no acute distress HEENT: Normal NECK: No JVD; No carotid bruits LYMPHATICS: No lymphadenopathy CARDIAC: S1 S2 regular, 2/6 systolic murmur at the apex. RESPIRATORY:  Clear to auscultation without rales, wheezing or rhonchi  ABDOMEN: Soft, non-tender, non-distended MUSCULOSKELETAL:  No edema; No deformity  SKIN: Warm and dry NEUROLOGIC:  Alert and oriented x 3 PSYCHIATRIC:  Normal affect    Signed, Jenean Lindau, MD  11/22/2019 9:18 AM    [place]

## 2019-11-22 NOTE — Patient Instructions (Addendum)
Medication Instructions:  Your physician has recommended you make the following change in your medication:  Increase your Norvasc to 10 mg daily.  *If you need a refill on your cardiac medications before your next appointment, please call your pharmacy*   Lab Work: Your physician recommends that you have fasting labs at your next appointment. If you have labs (blood work) drawn today and your tests are completely normal, you will receive your results only by: Marland Kitchen MyChart Message (if you have MyChart) OR . A paper copy in the mail If you have any lab test that is abnormal or we need to change your treatment, we will call you to review the results.   Testing/Procedures: None ordered   Follow-Up: At [facility], you and your health needs are our priority.  As part of our continuing mission to provide you with exceptional heart care, we have created designated Provider Care Teams.  These Care Teams include your primary Cardiologist (physician) and Advanced Practice Providers (APPs -  Physician Assistants and Nurse Practitioners) who all work together to provide you with the care you need, when you need it.  We recommend signing up for the patient portal called "MyChart".  Sign up information is provided on this After Visit Summary.  MyChart is used to connect with patients for Virtual Visits (Telemedicine).  Patients are able to view lab/test results, encounter notes, upcoming appointments, etc.  Non-urgent messages can be sent to your provider as well.   To learn more about what you can do with MyChart, go to [email or web address].    Your next appointment:   1 month(s)  The format for your next appointment:   In Person  Provider:   Jyl Heinz, MD   Other Instructions NA

## 2019-11-24 LAB — HM DIABETES EYE EXAM

## 2019-11-25 ENCOUNTER — Telehealth: Payer: Self-pay | Admitting: Cardiology

## 2019-11-25 NOTE — Telephone Encounter (Signed)
Pt c/o medication issue:  1. Name of Medication:  amLODipine (NORVASC) 10 MG tablet  amlodipine valsartan hydrochlorothiazide combination 10 mg 160mg  12.5mg   amlodipine valsartan hydrochlorothiazide combination 10mg  160mg  25mg    2. How are you currently taking this medication (dosage and times per day)? Unsure what patient is taking  3. Are you having a reaction (difficulty breathing--STAT)? no  4. What is your medication issue? Mickel Baas from [facility] in [place] calling stating they received 3 prescriptions of amlodipine for the patient and they are not sure what the patient is supposed to be taking. She states they received one prescription from Dr. Geraldo Pitter for amlodipine 10 mg and 2 from a [employer]. She states she tried contacting the office for [employer], but was unsuccessful. The prescriptions sent from her are for amlodipine valsartan, hyrdoclorithiazide combinations of 10 mg 160 mg 12.5 mg, and another one for 10 mg 160 mg and 25 mg. She states these prescriptions were sent on 11/24/2019.

## 2019-11-25 NOTE — Telephone Encounter (Signed)
Called [facility] and after a 10 min hold I disconnected. Will call back.

## 2019-11-25 NOTE — Telephone Encounter (Signed)
Spoke with pharmacist with [facility] and verified RX of Norvasc 10 mg daily.

## 2019-12-03 ENCOUNTER — Ambulatory Visit: Payer: No Typology Code available for payment source | Attending: Internal Medicine | Admitting: Family

## 2019-12-03 ENCOUNTER — Other Ambulatory Visit: Payer: Self-pay

## 2019-12-03 ENCOUNTER — Encounter: Payer: Self-pay | Admitting: Family

## 2019-12-03 VITALS — BP 194/81 | HR 73 | Temp 98.2°F | Resp 16 | Ht 68.5 in | Wt 163.8 lb

## 2019-12-03 DIAGNOSIS — IMO0002 Reserved for concepts with insufficient information to code with codable children: Secondary | ICD-10-CM

## 2019-12-03 DIAGNOSIS — E1142 Type 2 diabetes mellitus with diabetic polyneuropathy: Secondary | ICD-10-CM | POA: Diagnosis not present

## 2019-12-03 DIAGNOSIS — E1165 Type 2 diabetes mellitus with hyperglycemia: Secondary | ICD-10-CM | POA: Diagnosis not present

## 2019-12-03 DIAGNOSIS — M25475 Effusion, left foot: Secondary | ICD-10-CM

## 2019-12-03 DIAGNOSIS — M25474 Effusion, right foot: Secondary | ICD-10-CM

## 2019-12-03 DIAGNOSIS — I10 Essential (primary) hypertension: Secondary | ICD-10-CM

## 2019-12-03 DIAGNOSIS — M25471 Effusion, right ankle: Secondary | ICD-10-CM

## 2019-12-03 DIAGNOSIS — L97529 Non-pressure chronic ulcer of other part of left foot with unspecified severity: Secondary | ICD-10-CM

## 2019-12-03 DIAGNOSIS — M25472 Effusion, left ankle: Secondary | ICD-10-CM

## 2019-12-03 DIAGNOSIS — Z09 Encounter for follow-up examination after completed treatment for conditions other than malignant neoplasm: Secondary | ICD-10-CM | POA: Diagnosis not present

## 2019-12-03 LAB — GLUCOSE, POCT (MANUAL RESULT ENTRY): POC Glucose: 116 mg/dl — AB (ref 70–99)

## 2019-12-03 MED ORDER — CARVEDILOL 3.125 MG PO TABS: 3.1250 mg | ORAL_TABLET | Freq: Two times a day (BID) | ORAL | 0 refills | Status: DC

## 2019-12-03 MED ORDER — MUPIROCIN CALCIUM 2 % EX CREA: 1.0000 "application " | TOPICAL_CREAM | Freq: Every day | CUTANEOUS | 0 refills | Status: DC

## 2019-12-03 NOTE — Progress Notes (Signed)
Patient ID: Douglas Briggs, male    DOB: 1960-06-05  MRN: [id number]  CC: Hospitalization Follow-up (ED), Hypertension, and Diabetes   Subjective: Douglas Briggs is a 59 y.o. male with history of transient ischemic attack, stroke, and uncontrolled type 2 diabetes mellitus with diabetic polyneuropathy without long-term current use of insulin who presents for hospitalization follow-up.  1. ER FOLLOW UP: Time since discharge: 23 days Hospital/facility: [facility] Diagnosis: essential hypertension, bilateral lower extremity edema Procedures/tests: none Consultants: none New medications: Losartan increased from 25 mg/daily to 50 mg/daily and Hydrochlorothiazide added Discharge instructions:   Status: Feels slightly better  Patient previously diagnosed with subacute CVA at the beginning of June 2021. Last visit 11/10/2019 with Dr. Johnney Briggs. During that encounter patient with concern of persistent hypertension. Patient was asymptomatic. Losartan increased to 50 mg/daily. Considering patient had persistent ankle edema and stable renal function Hydrochlorothiazide was added for diuresis and blood pressure control. Counseled to continue Amlodipine 5 mg/daily.   2. HYPERTENSION FOLLOW-UP: Currently taking: see medication list Have you taken your blood pressure medication today: []  Yes [x]  No  Med Adherence: []  Yes    [x]  No, has not taken medication for 2 days because of bilateral feet and ankle swelling Medication side effects: [x]  Yes, bilateral feet and ankle swelling Adherence with salt restriction: [x]  Yes    []  No Exercise: Yes []  No [x]  Home Monitoring?: [x]  Yes    []  No Monitoring Frequency: [x]  Yes    []  No Home BP results range: [x]  Yes ,170's/100's even with taking medication Smoking []  Yes [x]  No SOB? []  Yes    [x]  No Chest Pain?: []  Yes    [x]  No Leg swelling?: []  Yes    [x]  No Headaches?: []  Yes    [x]  No Dizziness? []  Yes    [x]  No Comments:  Last  visit 11/22/2019 with cardiology Dr. Geraldo Briggs. During that encounter blood pressure elevated. Amlodipine increased to 10 mg/daily.    3. DIABETES TYPE 2 FOLLOW-UP: Last A1C:   Results for orders placed or performed in visit on 12/03/19  POCT glucose (manual entry)  Result Value Ref Range   POC Glucose 116 (A) 70 - 99 mg/dl    Med Adherence:  [x]  Yes    []  No Medication side effects:  []  Yes    [x]  No Home Monitoring?  []  Yes    [x]  No Home glucose results range: none Diet Adherence: [x]  Yes    []  No Exercise: []  Yes    [x]  No Hypoglycemic episodes?: []  Yes    [x]  No Numbness of the feet? [x]  Yes    []  No Retinopathy hx? []  Yes    [x]  No Last eye exam: plans for appt soon  Today patient reports taking Metformin 500 mg twice daily with meals.  4. TOE WOUND: Today patient reports left pinky toe wound which began 1 week ago. Reports initially left pinky toe had what seemed to be a callous. Reports he pulled the callous off. Reports he wears work-boots to work which he feels started rubbing at the raw site where he pulled the callous off. Denies that work-boots were too tight. Reports this is painful and has significant drainage. Has been using Peroxide and Epsom salt which do not help.  Patient Active Problem List   Diagnosis Date Noted  . Essential hypertension 11/22/2019  . Atherosclerotic vascular disease 11/22/2019  . Stroke ([place]) 10/30/2019  . TIA (transient ischemic attack) 10/29/2019  . Stroke-like  symptoms 10/29/2019  . Right sided numbness   . Acquired hammer toe of left foot 03/12/2017  . Tinea pedis of both feet 03/12/2017  . Male hypogonadism 11/14/2016  . Restless leg syndrome 11/14/2016  . Uncontrolled type 2 diabetes mellitus with diabetic polyneuropathy, without long-term current use of insulin ([place]) 11/12/2016     Current Outpatient Medications on File Prior to Visit  Medication Sig Dispense Refill  . amLODipine (NORVASC) 10 MG tablet Take 1 tablet (10 mg total)  by mouth daily. 30 tablet 1  . aspirin EC 81 MG tablet Take 1 tablet (81 mg total) by mouth daily. 150 tablet 2  . atorvastatin (LIPITOR) 80 MG tablet Take 1 tablet (80 mg total) by mouth daily. 30 tablet 1  . clopidogrel (PLAVIX) 75 MG tablet Take 1 tablet (75 mg total) by mouth daily. 30 tablet 2  . gabapentin (NEURONTIN) 100 MG capsule Take 100 mg by mouth at bedtime.     . hydrochlorothiazide (HYDRODIURIL) 25 MG tablet Take 1 tablet (25 mg total) by mouth daily. 30 tablet 0  . losartan (COZAAR) 25 MG tablet Take 2 tablets (50 mg total) by mouth daily. 60 tablet 0  . metFORMIN (GLUCOPHAGE) 500 MG tablet Take 500 mg by mouth 2 (two) times daily with a meal.    . Multiple Vitamin (MULTIVITAMIN WITH MINERALS) TABS tablet Take 1 tablet by mouth daily with breakfast.    . VITAMIN D PO Take 1 tablet by mouth daily with supper.    Marland Kitchen VITAMIN E PO Take 1 capsule by mouth daily with lunch.     No current facility-administered medications on file prior to visit.    No Known Allergies  Social History   Socioeconomic History  . Marital status: Married    Spouse name: Not on file  . Number of children: Not on file  . Years of education: Not on file  . Highest education level: Not on file  Occupational History  . Not on file  Tobacco Use  . Smoking status: Never Smoker  . Smokeless tobacco: Never Used  Vaping Use  . Vaping Use: Never used  Substance and Sexual Activity  . Alcohol use: No  . Drug use: Never  . Sexual activity: Not on file  Other Topics Concern  . Not on file  Social History Narrative  . Not on file   Social Determinants of Health   Financial Resource Strain:   . Difficulty of Paying Living Expenses:   Food Insecurity:   . Worried About Charity fundraiser in the Last Year:   . Arboriculturist in the Last Year:   Transportation Needs:   . Film/video editor (Medical):   Marland Kitchen Lack of Transportation (Non-Medical):   Physical Activity:   . Days of Exercise per  Week:   . Minutes of Exercise per Session:   Stress:   . Feeling of Stress :   Social Connections:   . Frequency of Communication with Friends and Family:   . Frequency of Social Gatherings with Friends and Family:   . Attends Religious Services:   . Active Member of Clubs or Organizations:   . Attends Archivist Meetings:   Marland Kitchen Marital Status:   Intimate Partner Violence:   . Fear of Current or Ex-Partner:   . Emotionally Abused:   Marland Kitchen Physically Abused:   . Sexually Abused:     No family history on file.  No past surgical history on file.  ROS: Review of Systems Negative except as stated above  PHYSICAL EXAM: Vitals with BMI 12/03/2019 11/22/2019 11/10/2019  Height 5' 8.5" 5\' 8"  -  Weight 163 lbs 13 oz 167 lbs -  BMI 49.70 26.3 -  Systolic 785 885 027  Diastolic 81 90 741  Pulse 73 86 77  SpO2- 99%, room air  Temperature- 98.57F, oral  Physical Exam General appearance - alert, well appearing, and in no distress and oriented to person, place, and time Mental status - alert, oriented to person, place, and time, normal mood, behavior, speech, dress, motor activity, and thought processes Neck - supple, no significant adenopathy Lymphatics - no palpable lymphadenopathy, no hepatosplenomegaly Chest - clear to auscultation, no wheezes, rales or rhonchi, symmetric air entry, no tachypnea, retractions or cyanosis Heart - normal rate, regular rhythm, normal S1, S2, no murmurs, rubs, clicks or gallops Neurological - alert, oriented, normal speech, no focal findings or movement disorder noted, cranial nerves II through XII intact, DTR's normal and symmetric, motor and sensory grossly normal bilaterally, normal muscle tone, no tremors, strength 5/5 Extremities - decreased left pedal pulse;  bilateral pedal and ankle edema 1+; no clubbing or cyanosis; strong right pedal pulse; monofilament sensory exam is normal in both feet excluding decreased sensory left great toe; no edema,  redness or tenderness in the calves or thighs Skin - shiny erythematous skin left dorsal foot  LEFT FOOT  ASSESSMENT AND PLAN: 1. Hospital discharge follow-up: -Last visit 11/10/2019 with Dr. Johnney Briggs. During that encounter patient with concern of persistent hypertension. Patient was asymptomatic. Losartan increased to 50 mg/daily. Considering patient had persistent ankle edema and stable renal function Hydrochlorothiazide 25 mg/daily was added for diuresis and blood pressure control. Counseled to continue Amlodipine 5 mg/daily.  -Today patient reports he feels slightly better but has concerns about feet and ankles swelling.  2. Essential hypertension: 3. Bilateral swelling of feet and ankles: -Blood pressure uncontrolled. -Blood pressure elevated in clinic today. Patient asymptomatic without chest pain, chest pressure, and shortness of breath. -Last visit 11/22/2019 with cardiology. During that encounter blood pressure elevated. Amlodipine increased to 10 mg/daily -Bilateral feet and ankle edema likely a side effect of increased Amlodipine dosage. Will decrease Amlodipine from 10 mg/daily to 5 mg/daily. -Will add Carvedilol to assist with hypertension.  -Continue Losartan and Hydrochlorothiazide as prescribed. -Keep appointment wit Cardiology scheduled 12/24/2019. -Counseled on blood pressure goal of less than 130/80, low-sodium, DASH diet, medication compliance, 150 minutes of moderate intensity exercise per week as tolerated. Discussed medication compliance, adverse effects. -Follow-up with primary physician in 1 month or sooner if needed. - carvedilol (COREG) 3.125 MG tablet; Take 1 tablet (3.125 mg total) by mouth 2 (two) times daily with a meal.  Dispense: 60 tablet; Refill: 0  4. Uncontrolled type 2 diabetes mellitus with diabetic polyneuropathy, without long-term current use of insulin ([place]): -Continue Metformin 500 mg twice daily with meals as prescribed. -To achieve an A1C goal of  less than or equal to 7.0 percent, a fasting blood sugar of 80 to 130 mg/dL and a postprandial glucose (90 to 120 minutes after a meal) less than 180 mg/dL. In the event of sugars less than 60 mg/dl or greater than 400 mg/dl please notify the clinic ASAP. It is recommended that you undergo annual eye exams and annual foot exams. -Discussed the importance of healthy eating habits, low-carbohydrate diet, low-sugar diet, regular aerobic exercise (at least 150 minutes a week as tolerated) and medication compliance to achieve or maintain control of  diabetes. -Follow-up with primary physician in 1 month or sooner if needed. - POCT glucose (manual entry)  5. Ulcer of toe of left foot, unspecified ulcer stage ([place]): -Counseled patient to cleanse left 5th toe ulcer with water, pat dry, apply Bactroban cream, and wrap in 2 x 2 gauze daily.  -Course of Clindamycin antibiotics for ulcer of left 5th toe. -Referral to Podiatry for further evaluation and management. - mupirocin cream (BACTROBAN) 2 %; Apply 1 application topically daily.  Dispense: 100 g; Refill: 0 - clindamycin (CLEOCIN) 300 MG capsule; Take 1 capsule (300 mg total) by mouth 3 (three) times daily for 7 days.  Dispense: 21 capsule; Refill: 0 - Ambulatory referral to Podiatry   Patient was given the opportunity to ask questions.  Patient verbalized understanding of the plan and was able to repeat key elements of the plan. Patient was given clear instructions to go to Emergency Department or return to medical center if symptoms don't improve, worsen, or new problems develop.The patient verbalized understanding.  Camillia Herter, NP

## 2019-12-03 NOTE — Patient Instructions (Addendum)
Decrease Amlodipine to 5 mg daily for blood pressure. Continue Hydrochlorothiazide for blood pressure. Adding Carvedilol twice daily for blood pressure. Bactroban cream and Clindamycin antibiotic pills for left toe wound. Referral to podiatry for left toe wound. Follow-up with primary physician in 1 month. Keep appointment with [facility] 12/07/2019. Keep appointment with Cardiology scheduled 12/24/2019. Hypertension, Adult Hypertension is another name for high blood pressure. High blood pressure forces your heart to work harder to pump blood. This can cause problems over time. There are two numbers in a blood pressure reading. There is a top number (systolic) over a bottom number (diastolic). It is best to have a blood pressure that is below 120/80. Healthy choices can help lower your blood pressure, or you may need medicine to help lower it. What are the causes? The cause of this condition is not known. Some conditions may be related to high blood pressure. What increases the risk?  Smoking.  Having type 2 diabetes mellitus, high cholesterol, or both.  Not getting enough exercise or physical activity.  Being overweight.  Having too much fat, sugar, calories, or salt (sodium) in your diet.  Drinking too much alcohol.  Having long-term (chronic) kidney disease.  Having a family history of high blood pressure.  Age. Risk increases with age.  Race. You may be at higher risk if you are African American.  Gender. Men are at higher risk than women before age 32. After age 73, women are at higher risk than men.  Having obstructive sleep apnea.  Stress. What are the signs or symptoms?  High blood pressure may not cause symptoms. Very high blood pressure (hypertensive crisis) may cause: ? Headache. ? Feelings of worry or nervousness (anxiety). ? Shortness of breath. ? Nosebleed. ? A feeling of being sick to your stomach (nausea). ? Throwing up  (vomiting). ? Changes in how you see. ? Very bad chest pain. ? Seizures. How is this treated?  This condition is treated by making healthy lifestyle changes, such as: ? Eating healthy foods. ? Exercising more. ? Drinking less alcohol.  Your health care provider may prescribe medicine if lifestyle changes are not enough to get your blood pressure under control, and if: ? Your top number is above 130. ? Your bottom number is above 80.  Your personal target blood pressure may vary. Follow these instructions at home: Eating and drinking   If told, follow the DASH eating plan. To follow this plan: ? Fill one half of your plate at each meal with fruits and vegetables. ? Fill one fourth of your plate at each meal with whole grains. Whole grains include whole-wheat pasta, brown rice, and whole-grain bread. ? Eat or drink low-fat dairy products, such as skim milk or low-fat yogurt. ? Fill one fourth of your plate at each meal with low-fat (lean) proteins. Low-fat proteins include fish, chicken without skin, eggs, beans, and tofu. ? Avoid fatty meat, cured and processed meat, or chicken with skin. ? Avoid pre-made or processed food.  Eat less than 1,500 mg of salt each day.  Do not drink alcohol if: ? Your doctor tells you not to drink. ? You are pregnant, may be pregnant, or are planning to become pregnant.  If you drink alcohol: ? Limit how much you use to:  0-1 drink a day for women.  0-2 drinks a day for men. ? Be aware of how much alcohol is in your drink. In the [place] one drink equals one 12 oz bottle  of beer (355 mL), one 5 oz glass of wine (148 mL), or one 1 oz glass of hard liquor (44 mL). Lifestyle   Work with your doctor to stay at a healthy weight or to lose weight. Ask your doctor what the best weight is for you.  Get at least 30 minutes of exercise most days of the week. This may include walking, swimming, or biking.  Get at least 30 minutes of exercise that  strengthens your muscles (resistance exercise) at least 3 days a week. This may include lifting weights or doing Pilates.  Do not use any products that contain nicotine or tobacco, such as cigarettes, e-cigarettes, and chewing tobacco. If you need help quitting, ask your doctor.  Check your blood pressure at home as told by your doctor.  Keep all follow-up visits as told by your doctor. This is important. Medicines  Take over-the-counter and prescription medicines only as told by your doctor. Follow directions carefully.  Do not skip doses of blood pressure medicine. The medicine does not work as well if you skip doses. Skipping doses also puts you at risk for problems.  Ask your doctor about side effects or reactions to medicines that you should watch for. Contact a doctor if you:  Think you are having a reaction to the medicine you are taking.  Have headaches that keep coming back (recurring).  Feel dizzy.  Have swelling in your ankles.  Have trouble with your vision. Get help right away if you:  Get a very bad headache.  Start to feel mixed up (confused).  Feel weak or numb.  Feel faint.  Have very bad pain in your: ? Chest. ? Belly (abdomen).  Throw up more than once.  Have trouble breathing. Summary  Hypertension is another name for high blood pressure.  High blood pressure forces your heart to work harder to pump blood.  For most people, a normal blood pressure is less than 120/80.  Making healthy choices can help lower blood pressure. If your blood pressure does not get lower with healthy choices, you may need to take medicine. This information is not intended to replace advice given to you by your health care provider. Make sure you discuss any questions you have with your health care provider. Document Revised: 01/21/2018 Document Reviewed: 01/21/2018 Elsevier Patient Education  2020 [employer].

## 2019-12-04 MED ORDER — CLINDAMYCIN HCL 300 MG PO CAPS: 300.0000 mg | ORAL_CAPSULE | Freq: Three times a day (TID) | ORAL | 0 refills | Status: AC

## 2019-12-06 NOTE — Progress Notes (Signed)
CBG discussed in clinic.

## 2019-12-07 ENCOUNTER — Inpatient Hospital Stay: Payer: No Typology Code available for payment source | Admitting: Adult Health

## 2019-12-07 ENCOUNTER — Encounter: Payer: Self-pay | Admitting: Adult Health

## 2019-12-08 ENCOUNTER — Other Ambulatory Visit: Payer: Self-pay

## 2019-12-08 ENCOUNTER — Ambulatory Visit: Payer: No Typology Code available for payment source | Admitting: Podiatry

## 2019-12-18 ENCOUNTER — Other Ambulatory Visit: Payer: Self-pay

## 2019-12-18 DIAGNOSIS — Z7902 Long term (current) use of antithrombotics/antiplatelets: Secondary | ICD-10-CM

## 2019-12-18 DIAGNOSIS — D631 Anemia in chronic kidney disease: Secondary | ICD-10-CM | POA: Diagnosis present

## 2019-12-18 DIAGNOSIS — Z20822 Contact with and (suspected) exposure to covid-19: Secondary | ICD-10-CM | POA: Diagnosis present

## 2019-12-18 DIAGNOSIS — S80812A Abrasion, left lower leg, initial encounter: Secondary | ICD-10-CM | POA: Diagnosis present

## 2019-12-18 DIAGNOSIS — Z7982 Long term (current) use of aspirin: Secondary | ICD-10-CM

## 2019-12-18 DIAGNOSIS — E1152 Type 2 diabetes mellitus with diabetic peripheral angiopathy with gangrene: Principal | ICD-10-CM | POA: Diagnosis present

## 2019-12-18 DIAGNOSIS — E11621 Type 2 diabetes mellitus with foot ulcer: Secondary | ICD-10-CM | POA: Diagnosis present

## 2019-12-18 DIAGNOSIS — I129 Hypertensive chronic kidney disease with stage 1 through stage 4 chronic kidney disease, or unspecified chronic kidney disease: Secondary | ICD-10-CM | POA: Diagnosis present

## 2019-12-18 DIAGNOSIS — N1831 Chronic kidney disease, stage 3a: Secondary | ICD-10-CM | POA: Diagnosis present

## 2019-12-18 DIAGNOSIS — Z79899 Other long term (current) drug therapy: Secondary | ICD-10-CM

## 2019-12-18 DIAGNOSIS — Z8673 Personal history of transient ischemic attack (TIA), and cerebral infarction without residual deficits: Secondary | ICD-10-CM

## 2019-12-18 DIAGNOSIS — L03116 Cellulitis of left lower limb: Secondary | ICD-10-CM | POA: Diagnosis present

## 2019-12-18 DIAGNOSIS — E1142 Type 2 diabetes mellitus with diabetic polyneuropathy: Secondary | ICD-10-CM | POA: Diagnosis present

## 2019-12-18 DIAGNOSIS — L97429 Non-pressure chronic ulcer of left heel and midfoot with unspecified severity: Secondary | ICD-10-CM | POA: Diagnosis present

## 2019-12-18 DIAGNOSIS — E1122 Type 2 diabetes mellitus with diabetic chronic kidney disease: Secondary | ICD-10-CM | POA: Diagnosis present

## 2019-12-18 DIAGNOSIS — E785 Hyperlipidemia, unspecified: Secondary | ICD-10-CM | POA: Diagnosis present

## 2019-12-18 DIAGNOSIS — L97529 Non-pressure chronic ulcer of other part of left foot with unspecified severity: Secondary | ICD-10-CM | POA: Diagnosis present

## 2019-12-18 DIAGNOSIS — Z7984 Long term (current) use of oral hypoglycemic drugs: Secondary | ICD-10-CM

## 2019-12-18 DIAGNOSIS — L02612 Cutaneous abscess of left foot: Secondary | ICD-10-CM | POA: Diagnosis present

## 2019-12-18 DIAGNOSIS — S91302A Unspecified open wound, left foot, initial encounter: Secondary | ICD-10-CM | POA: Diagnosis present

## 2019-12-19 ENCOUNTER — Inpatient Hospital Stay (HOSPITAL_BASED_OUTPATIENT_CLINIC_OR_DEPARTMENT_OTHER)
Admission: EM | Admit: 2019-12-19 | Discharge: 2019-12-22 | DRG: 240 | Disposition: A | Discharge status: Home / Self Care | Payer: No Typology Code available for payment source | Attending: Internal Medicine | Admitting: Internal Medicine

## 2019-12-19 ENCOUNTER — Encounter (HOSPITAL_BASED_OUTPATIENT_CLINIC_OR_DEPARTMENT_OTHER): Payer: Self-pay | Admitting: Emergency Medicine

## 2019-12-19 ENCOUNTER — Emergency Department (HOSPITAL_BASED_OUTPATIENT_CLINIC_OR_DEPARTMENT_OTHER): Payer: No Typology Code available for payment source

## 2019-12-19 DIAGNOSIS — N189 Chronic kidney disease, unspecified: Secondary | ICD-10-CM | POA: Diagnosis present

## 2019-12-19 DIAGNOSIS — L089 Local infection of the skin and subcutaneous tissue, unspecified: Secondary | ICD-10-CM

## 2019-12-19 DIAGNOSIS — E785 Hyperlipidemia, unspecified: Secondary | ICD-10-CM | POA: Diagnosis present

## 2019-12-19 DIAGNOSIS — I96 Gangrene, not elsewhere classified: Secondary | ICD-10-CM

## 2019-12-19 DIAGNOSIS — I10 Essential (primary) hypertension: Secondary | ICD-10-CM | POA: Diagnosis present

## 2019-12-19 DIAGNOSIS — L0390 Cellulitis, unspecified: Secondary | ICD-10-CM | POA: Insufficient documentation

## 2019-12-19 DIAGNOSIS — D649 Anemia, unspecified: Secondary | ICD-10-CM | POA: Diagnosis present

## 2019-12-19 DIAGNOSIS — E119 Type 2 diabetes mellitus without complications: Secondary | ICD-10-CM

## 2019-12-19 HISTORY — DX: Cellulitis, unspecified: L03.90

## 2019-12-19 HISTORY — DX: Gangrene, not elsewhere classified: I96

## 2019-12-19 LAB — CBC
HCT: 31.1 % — ABNORMAL LOW (ref 39.0–52.0)
Hemoglobin: 10.4 g/dL — ABNORMAL LOW (ref 13.0–17.0)
MCH: 27.4 pg (ref 26.0–34.0)
MCHC: 33.4 g/dL (ref 30.0–36.0)
MCV: 82.1 fL (ref 80.0–100.0)
Platelets: 175 10*3/uL (ref 150–400)
RBC: 3.79 MIL/uL — ABNORMAL LOW (ref 4.22–5.81)
RDW: 12.7 % (ref 11.5–15.5)
WBC: 4.7 10*3/uL (ref 4.0–10.5)
nRBC: 0 % (ref 0.0–0.2)

## 2019-12-19 LAB — CBC WITH DIFFERENTIAL/PLATELET
Abs Immature Granulocytes: 0.02 10*3/uL (ref 0.00–0.07)
Basophils Absolute: 0 10*3/uL (ref 0.0–0.1)
Basophils Relative: 1 %
Eosinophils Absolute: 0.3 10*3/uL (ref 0.0–0.5)
Eosinophils Relative: 4 %
HCT: 31.7 % — ABNORMAL LOW (ref 39.0–52.0)
Hemoglobin: 10.4 g/dL — ABNORMAL LOW (ref 13.0–17.0)
Immature Granulocytes: 0 %
Lymphocytes Relative: 26 %
Lymphs Abs: 1.7 10*3/uL (ref 0.7–4.0)
MCH: 27.1 pg (ref 26.0–34.0)
MCHC: 32.8 g/dL (ref 30.0–36.0)
MCV: 82.6 fL (ref 80.0–100.0)
Monocytes Absolute: 0.6 10*3/uL (ref 0.1–1.0)
Monocytes Relative: 10 %
Neutro Abs: 3.9 10*3/uL (ref 1.7–7.7)
Neutrophils Relative %: 59 %
Platelets: 175 10*3/uL (ref 150–400)
RBC: 3.84 MIL/uL — ABNORMAL LOW (ref 4.22–5.81)
RDW: 12.7 % (ref 11.5–15.5)
WBC: 6.5 10*3/uL (ref 4.0–10.5)
nRBC: 0 % (ref 0.0–0.2)

## 2019-12-19 LAB — SARS CORONAVIRUS 2 BY RT PCR (HOSPITAL ORDER, PERFORMED IN ~~LOC~~ HOSPITAL LAB): SARS Coronavirus 2: NEGATIVE

## 2019-12-19 LAB — BASIC METABOLIC PANEL
Anion gap: 9 (ref 5–15)
BUN: 20 mg/dL (ref 6–20)
CO2: 24 mmol/L (ref 22–32)
Calcium: 8.8 mg/dL — ABNORMAL LOW (ref 8.9–10.3)
Chloride: 105 mmol/L (ref 98–111)
Creatinine, Ser: 1.43 mg/dL — ABNORMAL HIGH (ref 0.61–1.24)
GFR calc Af Amer: >60 mL/min (ref >60)
GFR calc non Af Amer: 53 mL/min — ABNORMAL LOW (ref >60)
Glucose, Bld: 150 mg/dL — ABNORMAL HIGH (ref 70–99)
Potassium: 4 mmol/L (ref 3.5–5.1)
Sodium: 138 mmol/L (ref 135–145)

## 2019-12-19 LAB — CREATININE, SERUM
Creatinine, Ser: 1.2 mg/dL (ref 0.61–1.24)
GFR calc Af Amer: >60 mL/min (ref >60)
GFR calc non Af Amer: >60 mL/min (ref >60)

## 2019-12-19 LAB — C-REACTIVE PROTEIN: CRP: 1.1 mg/dL — ABNORMAL HIGH (ref <1.0)

## 2019-12-19 LAB — GLUCOSE, CAPILLARY
Glucose-Capillary: 108 mg/dL — ABNORMAL HIGH (ref 70–99)
Glucose-Capillary: 137 mg/dL — ABNORMAL HIGH (ref 70–99)

## 2019-12-19 LAB — SEDIMENTATION RATE: Sed Rate: 81 mm/hr — ABNORMAL HIGH (ref 0–16)

## 2019-12-19 MED ORDER — FENTANYL CITRATE (PF) 100 MCG/2ML IJ SOLN
50.0000 ug | Freq: Once | INTRAMUSCULAR | Status: AC
  Administered 2019-12-19: 50 ug via INTRAVENOUS
  Filled 2019-12-19: qty 2

## 2019-12-19 MED ORDER — VANCOMYCIN HCL IN DEXTROSE 1-5 GM/200ML-% IV SOLN
1000.0000 mg | Freq: Once | INTRAVENOUS | Status: DC
  Filled 2019-12-19: qty 200

## 2019-12-19 MED ORDER — PIPERACILLIN-TAZOBACTAM 3.375 G IVPB
3.3750 g | Freq: Three times a day (TID) | INTRAVENOUS | Status: DC
  Administered 2019-12-20 – 2019-12-21 (×5): 3.375 g via INTRAVENOUS
  Filled 2019-12-19 (×5): qty 50

## 2019-12-19 MED ORDER — PIPERACILLIN-TAZOBACTAM 3.375 G IVPB 30 MIN
3.3750 g | Freq: Once | INTRAVENOUS | Status: AC
  Administered 2019-12-19: 3.375 g via INTRAVENOUS
  Filled 2019-12-19: qty 50

## 2019-12-19 MED ORDER — ONDANSETRON HCL 4 MG/2ML IJ SOLN: 4.0000 mg | Freq: Four times a day (QID) | INTRAMUSCULAR | Status: DC | PRN

## 2019-12-19 MED ORDER — INSULIN ASPART 100 UNIT/ML ~~LOC~~ SOLN
0.0000 [IU] | Freq: Three times a day (TID) | SUBCUTANEOUS | Status: DC
  Administered 2019-12-20 (×2): 3 [IU] via SUBCUTANEOUS
  Administered 2019-12-21: 2 [IU] via SUBCUTANEOUS
  Administered 2019-12-22: 5 [IU] via SUBCUTANEOUS
  Administered 2019-12-22: 2 [IU] via SUBCUTANEOUS

## 2019-12-19 MED ORDER — CARVEDILOL 3.125 MG PO TABS
3.1250 mg | ORAL_TABLET | Freq: Two times a day (BID) | ORAL | Status: DC
  Administered 2019-12-19 – 2019-12-22 (×6): 3.125 mg via ORAL
  Filled 2019-12-19 (×7): qty 1

## 2019-12-19 MED ORDER — VANCOMYCIN HCL 750 MG/150ML IV SOLN
750.0000 mg | Freq: Two times a day (BID) | INTRAVENOUS | Status: DC
  Administered 2019-12-20 – 2019-12-21 (×3): 750 mg via INTRAVENOUS
  Filled 2019-12-19 (×3): qty 150

## 2019-12-19 MED ORDER — ACETAMINOPHEN 650 MG RE SUPP: 650.0000 mg | Freq: Four times a day (QID) | RECTAL | Status: DC | PRN

## 2019-12-19 MED ORDER — ONDANSETRON HCL 4 MG PO TABS: 4.0000 mg | ORAL_TABLET | Freq: Four times a day (QID) | ORAL | Status: DC | PRN

## 2019-12-19 MED ORDER — HYDROCHLOROTHIAZIDE 25 MG PO TABS
25.0000 mg | ORAL_TABLET | Freq: Every day | ORAL | Status: DC
  Administered 2019-12-19 – 2019-12-22 (×4): 25 mg via ORAL
  Filled 2019-12-19 (×4): qty 1

## 2019-12-19 MED ORDER — AMLODIPINE BESYLATE 10 MG PO TABS
10.0000 mg | ORAL_TABLET | Freq: Every day | ORAL | Status: DC
  Administered 2019-12-20 – 2019-12-21 (×2): 10 mg via ORAL
  Filled 2019-12-19 (×3): qty 1

## 2019-12-19 MED ORDER — INSULIN ASPART 100 UNIT/ML ~~LOC~~ SOLN: 0.0000 [IU] | Freq: Every day | SUBCUTANEOUS | Status: DC

## 2019-12-19 MED ORDER — ENOXAPARIN SODIUM 40 MG/0.4ML ~~LOC~~ SOLN
40.0000 mg | SUBCUTANEOUS | Status: DC
  Administered 2019-12-20 – 2019-12-22 (×3): 40 mg via SUBCUTANEOUS
  Filled 2019-12-19 (×3): qty 0.4

## 2019-12-19 MED ORDER — VANCOMYCIN HCL 1250 MG/250ML IV SOLN
1250.0000 mg | Freq: Once | INTRAVENOUS | Status: AC
  Administered 2019-12-19: 1250 mg via INTRAVENOUS
  Filled 2019-12-19: qty 250

## 2019-12-19 MED ORDER — OXYCODONE HCL 5 MG PO TABS: 5.0000 mg | ORAL_TABLET | ORAL | Status: DC | PRN

## 2019-12-19 MED ORDER — CLINDAMYCIN PHOSPHATE 600 MG/50ML IV SOLN
600.0000 mg | Freq: Once | INTRAVENOUS | Status: AC
  Administered 2019-12-19: 600 mg via INTRAVENOUS
  Filled 2019-12-19: qty 50

## 2019-12-19 MED ORDER — ATORVASTATIN CALCIUM 80 MG PO TABS
80.0000 mg | ORAL_TABLET | Freq: Every day | ORAL | Status: DC
  Administered 2019-12-19 – 2019-12-22 (×4): 80 mg via ORAL
  Filled 2019-12-19: qty 2
  Filled 2019-12-19 (×3): qty 1

## 2019-12-19 MED ORDER — CLOPIDOGREL BISULFATE 75 MG PO TABS
75.0000 mg | ORAL_TABLET | Freq: Every day | ORAL | Status: DC
  Administered 2019-12-19: 75 mg via ORAL
  Filled 2019-12-19: qty 1

## 2019-12-19 MED ORDER — ACETAMINOPHEN 325 MG PO TABS
650.0000 mg | ORAL_TABLET | Freq: Four times a day (QID) | ORAL | Status: DC | PRN
  Administered 2019-12-22: 650 mg via ORAL
  Filled 2019-12-19: qty 2

## 2019-12-19 MED ORDER — HYDROMORPHONE HCL 1 MG/ML IJ SOLN: 0.5000 mg | INTRAMUSCULAR | Status: DC | PRN

## 2019-12-19 MED ORDER — CARVEDILOL 6.25 MG PO TABS
ORAL_TABLET | ORAL | Status: AC
  Administered 2019-12-19: 3.125 mg via ORAL
  Filled 2019-12-19: qty 1

## 2019-12-19 MED ORDER — LOSARTAN POTASSIUM 50 MG PO TABS
50.0000 mg | ORAL_TABLET | Freq: Every day | ORAL | Status: DC
  Administered 2019-12-19 – 2019-12-21 (×3): 50 mg via ORAL
  Filled 2019-12-19 (×3): qty 1

## 2019-12-19 NOTE — Anesthesia Preprocedure Evaluation (Addendum)
Anesthesia Evaluation  Patient identified by MRN, date of birth, ID band Patient awake    Reviewed: Allergy & Precautions, NPO status , Patient's Chart, lab work & pertinent test results  Airway Mallampati: III  TM Distance: >3 FB Neck ROM: Full    Dental  (+) Missing, Upper Dentures   Pulmonary neg pulmonary ROS,    Pulmonary exam normal breath sounds clear to auscultation       Cardiovascular hypertension, Pt. on medications and Pt. on home beta blockers Normal cardiovascular exam Rhythm:Regular Rate:Normal  ECG: SR, rate 72. LVH  ECHO: 1. Left ventricular ejection fraction, by estimation, is 50 to 55%. The left ventricle has normal function. The left ventricle has no regional wall motion abnormalities. There is mild left ventricular hypertrophy. Left ventricular diastolic parameters are consistent with Grade I diastolic dysfunction (impaired relaxation). 2. Right ventricular systolic function is normal. The right ventricular size is normal. 3. The mitral valve is normal in structure. No evidence of mitral valve regurgitation. No evidence of mitral stenosis. 4. The aortic valve is tricuspid. Aortic valve regurgitation is not visualized. No aortic stenosis is present. 5. The inferior vena cava is normal in size with greater than 50% respiratory variability, suggesting right atrial pressure of 3 mmHg.   Neuro/Psych Neuropathy TIA Neuromuscular disease CVA, No Residual Symptoms negative psych ROS   GI/Hepatic negative GI ROS, Neg liver ROS,   Endo/Other  diabetes, Oral Hypoglycemic Agents  Renal/GU negative Renal ROS     Musculoskeletal negative musculoskeletal ROS (+)   Abdominal   Peds  Hematology negative hematology ROS (+) anemia ,   Anesthesia Other Findings LEFT TOE INFECTION  Reproductive/Obstetrics                            Anesthesia Physical Anesthesia Plan  ASA:  III  Anesthesia Plan: General   Post-op Pain Management:    Induction: Intravenous  PONV Risk Score and Plan: 2 and Ondansetron, Dexamethasone and Treatment may vary due to age or medical condition  Airway Management Planned: LMA  Additional Equipment:   Intra-op Plan:   Post-operative Plan: Extubation in OR  Informed Consent: I have reviewed the patients History and Physical, chart, labs and discussed the procedure including the risks, benefits and alternatives for the proposed anesthesia with the patient or authorized representative who has indicated his/her understanding and acceptance.     Dental advisory given  Plan Discussed with: CRNA  Anesthesia Plan Comments:        Anesthesia Quick Evaluation

## 2019-12-19 NOTE — ED Notes (Signed)
Family brought meal to pt; delivered by staff; pt sitting up eating w/o difficulty.

## 2019-12-19 NOTE — H&P (Signed)
History and Physical    Douglas Briggs MWU:[id number] DOB: 1960/12/13 DOA: 12/19/2019  PCP: Patient, No Pcp Per  Patient coming from: [facility]  I have personally briefly reviewed patient's old medical records in [place]  Chief Complaint: left 5th toe gangrene  HPI: Douglas Briggs is a 59 y.o. male with medical history significant of CVA, diabetes mellitus, hypertension, hyperlipidemia, diabetic neuropathy presents to emergency department with left fifth toe pain since couple of weeks and it turned black 2 days ago.  He tells me that his work boots caused a callus.  He says he pulled callus causing an open wound.  He was evaluated by his PCP on 7/9.  Reports that his left fifth toe turned black 2 days ago associated with some swelling and redness in left lower extremity.  Reports that pain is severe in intensity however denies association with fever, chills, nausea, vomiting, generalized weakness or lethargy.  He also reports that he was bitten by a bug?  A few days ago on left leg and having an open wound.  No history of headache, blurry vision, chest pain, shortness of breath, palpitation, abdominal pain, urinary or bowel changes.  No history of smoking, alcohol, illicit drug use.  ED Course: Upon arrival to ED: Patient afebrile with no leukocytosis.  Blood pressure was noted to be elevated in 190s/90s.  ESR: 81, CRP: 1.1.  COVID-19 negative.  X-ray shows gangrenous changes and fifth digit of left foot.  Patient received fentanyl and clindamycin in ED.  Patient transferred to [facility] for further evaluation and management.  review of Systems: As per HPI otherwise negative.    Past Medical History:  Diagnosis Date  . Diabetes mellitus without complication ([place])   . Neuropathy   . Stroke [facility])     History reviewed. No pertinent surgical history.   reports that he has never smoked. He has never used smokeless tobacco. He reports that he does not drink alcohol and does not use  drugs.  No Known Allergies  History reviewed. No pertinent family history.  Prior to Admission medications   Medication Sig Start Date End Date Taking? Authorizing Provider  amLODipine (NORVASC) 10 MG tablet Take 1 tablet (10 mg total) by mouth daily. 11/22/19 12/22/19  Revankar, Reita Cliche, MD  aspirin EC 81 MG tablet Take 1 tablet (81 mg total) by mouth daily. 10/31/19 10/30/20  Elodia Florence., MD  atorvastatin (LIPITOR) 80 MG tablet Take 1 tablet (80 mg total) by mouth daily. 11/01/19 12/01/19  Elodia Florence., MD  carvedilol (COREG) 3.125 MG tablet Take 1 tablet (3.125 mg total) by mouth 2 (two) times daily with a meal. 12/03/19   Camillia Herter, NP  clopidogrel (PLAVIX) 75 MG tablet Take 1 tablet (75 mg total) by mouth daily. 11/01/19 01/30/20  Elodia Florence., MD  gabapentin (NEURONTIN) 100 MG capsule Take 100 mg by mouth at bedtime.     [provider]  hydrochlorothiazide (HYDRODIURIL) 25 MG tablet Take 1 tablet (25 mg total) by mouth daily. 11/10/19   Charlesetta Shanks, MD  losartan (COZAAR) 25 MG tablet Take 2 tablets (50 mg total) by mouth daily. 11/10/19 12/10/19  Charlesetta Shanks, MD  metFORMIN (GLUCOPHAGE) 500 MG tablet Take 500 mg by mouth 2 (two) times daily with a meal.    [provider]  Multiple Vitamin (MULTIVITAMIN WITH MINERALS) TABS tablet Take 1 tablet by mouth daily with breakfast.    [provider]  mupirocin cream (BACTROBAN)  2 % Apply 1 application topically daily. 12/03/19   Camillia Herter, NP  VITAMIN D PO Take 1 tablet by mouth daily with supper.    [provider]  VITAMIN E PO Take 1 capsule by mouth daily with lunch.    [provider]    Physical Exam: Vitals:   12/19/19 1100 12/19/19 1330 12/19/19 1527 12/19/19 1643  BP: (!) 184/83 (!) 145/131 (!) 194/97 (!) 172/90  Pulse: 67 59 68 67  Resp: 16 14 16 16   Temp:    97.7 F (36.5 C)  TempSrc:    Oral  SpO2: 100% 99% 100% 100%  Weight:         Constitutional: NAD, calm, comfortable Eyes: PERRL, lids and conjunctivae normal ENMT: Mucous membranes are moist. Posterior pharynx clear of any exudate or lesions.Normal dentition.  Neck: normal, supple, no masses, no thyromegaly Respiratory: clear to auscultation bilaterally, no wheezing, no crackles. Normal respiratory effort. No accessory muscle use.  Cardiovascular: Regular rate and rhythm, no murmurs / rubs / gallops. No extremity edema. 2+ pedal pulses. No carotid bruits.  Abdomen: no tenderness, no masses palpated. No hepatosplenomegaly. Bowel sounds positive.  Musculoskeletal:        Neurologic: CN 2-12 grossly intact. Sensation intact, DTR normal. Strength 5/5 in all 4.  Psychiatric: Normal judgment and insight. Alert and oriented x 3. Normal mood.    Labs on Admission: I have personally reviewed following labs and imaging studies  CBC: Recent Labs  Lab 12/19/19 0229 12/19/19 1118  WBC 6.5 4.7  NEUTROABS 3.9  --   HGB 10.4* 10.4*  HCT 31.7* 31.1*  MCV 82.6 82.1  PLT 175 998   Basic Metabolic Panel: Recent Labs  Lab 12/19/19 0229 12/19/19 1118  NA 138  --   K 4.0  --   CL 105  --   CO2 24  --   GLUCOSE 150*  --   BUN 20  --   CREATININE 1.43* 1.20  CALCIUM 8.8*  --    GFR: Estimated Creatinine Clearance: 65.3 mL/min (by C-G formula based on SCr of 1.2 mg/dL). Liver Function Tests: No results for input(s): AST, ALT, ALKPHOS, BILITOT, PROT, ALBUMIN in the last 168 hours. No results for input(s): LIPASE, AMYLASE in the last 168 hours. No results for input(s): AMMONIA in the last 168 hours. Coagulation Profile: No results for input(s): INR, PROTIME in the last 168 hours. Cardiac Enzymes: No results for input(s): CKTOTAL, CKMB, CKMBINDEX, TROPONINI in the last 168 hours. BNP (last 3 results) No results for input(s): PROBNP in the last 8760 hours. HbA1C: No results for input(s): HGBA1C in the last 72 hours. CBG: No results for input(s): GLUCAP in  the last 168 hours. Lipid Profile: No results for input(s): CHOL, HDL, LDLCALC, TRIG, CHOLHDL, LDLDIRECT in the last 72 hours. Thyroid Function Tests: No results for input(s): TSH, T4TOTAL, FREET4, T3FREE, THYROIDAB in the last 72 hours. Anemia Panel: No results for input(s): VITAMINB12, FOLATE, FERRITIN, TIBC, IRON, RETICCTPCT in the last 72 hours. Urine analysis:    Component Value Date/Time   COLORURINE YELLOW 11/07/2019 0546   APPEARANCEUR CLEAR 11/07/2019 0546   LABSPEC 1.020 11/07/2019 0546   PHURINE 6.5 11/07/2019 0546   GLUCOSEU 100 (A) 11/07/2019 0546   HGBUR TRACE (A) 11/07/2019 0546   BILIRUBINUR NEGATIVE 11/07/2019 0546   KETONESUR NEGATIVE 11/07/2019 0546   PROTEINUR >300 (A) 11/07/2019 0546   UROBILINOGEN 0.2 05/28/2009 1405   NITRITE NEGATIVE 11/07/2019 0546   LEUKOCYTESUR NEGATIVE 11/07/2019  0546    Radiological Exams on Admission: DG Foot Complete Left  Result Date: 12/19/2019 CLINICAL DATA:  Left fifth digit pain EXAM: LEFT FOOT - COMPLETE 3+ VIEW COMPARISON:  None. FINDINGS: Frontal, oblique, and lateral views of the left foot are obtained. There are no acute displaced fractures. There is decreased soft tissue of the fifth digit, concerning for necrosis. Minimal subcutaneous gas is seen within the lateral aspect of the fifth digit. There is subtle cortical erosion lateral margin distal aspect fifth proximal phalanx. IMPRESSION: 1. Findings concerning for gangrene of the fifth digit left foot, with subcutaneous gas and underlying fifth proximal phalangeal cortical destruction. Superimposed infection could also give this appearance. Electronically Signed   By: Randa Ngo M.D.   On: 12/19/2019 02:34    Assessment/Plan Principal Problem:   Dry gangrene (HCC) Active Problems:   Essential hypertension   Diabetes mellitus without complication (HCC)   HLD (hyperlipidemia)   Normocytic anemia   CKD (chronic kidney disease)    Gangrene of fifth digit of left  foot: -He is afebrile with no leukocytosis.  COVID-19 is negative.  CRP: 1.1, ESR: 81.  Reviewed x-ray of left foot.  Patient received clindamycin at [facility]. -We will get blood culture, lactic acid. -We will start patient on Vanco and Zosyn. -Tylenol/oxycodone/Dilaudid as needed for mild/moderate/severe pain respectively. -Consult orthopedic surgery-discussed with Dr. Nicolette Bang will keep patient n.p.o. after midnight.  He will be seen by orthopedic surgery tomorrow AM.  Ulcer on left leg: -Consulted wound care  Hypertension: Blood pressure elevated.  Likely secondary to pain -Continue amlodipine, Coreg, HCTZ, losartan.  Hyperlipidemia: Continue statin  Diabetes mellitus: Check A1c.  Hold Metformin for now.  Started patient on sliding scale insulin.  Monitor blood sugar closely.  Diabetic neuropathy: Continue gabapentin  CKD: Patient's BUN and creatinine and GFR improved from previous labs. -Continue to monitor  Anemia of chronic disease: H&H is currently stable. -Monitor  History of CVA: Continue aspirin and statin.  Hold Plavix for now  DVT prophylaxis: Lovenox/SCD Code Status: Full code  Family Communication: None present at bedside.  Plan of care discussed with patient in length and he verbalized understanding and agreed with it. Disposition Plan: Home after surgery Consults called: Orthopedic surgery  admission status: Inpatient   Mckinley Jewel MD [facility]  If 7PM-7AM, please contact night-coverage [email or web address] Password [facility]  12/19/2019, 4:58 PM

## 2019-12-19 NOTE — ED Provider Notes (Signed)
[place] EMERGENCY DEPARTMENT Provider Note   CSN: [id number] Arrival date & time: 12/18/19  2353    History Chief Complaint  Patient presents with  . Toe Pain    Patient is 59yo male with hx of TIA, DM2 who presents with worsening L 5th toe pain. Patient reports he was wearing a work boot on left foot that caused a callous. He says he pulled the callous off, causing an open wound at the beginning of July. He was told to apply a prescribed cream and keep the wound moist during last doctor visit 7/9. Now reports toe is more purulent and turned black two days ago. He says the pain is unbearable. He also reports he was bitten by bug a few days ago on L leg, leaving an open wound. Denies fever, CP, SOB.      Past Medical History:  Diagnosis Date  . Diabetes mellitus without complication ([place])   . Neuropathy   . Stroke [facility])     Patient Active Problem List   Diagnosis Date Noted  . Essential hypertension 11/22/2019  . Atherosclerotic vascular disease 11/22/2019  . Stroke ([place]) 10/30/2019  . TIA (transient ischemic attack) 10/29/2019  . Stroke-like symptoms 10/29/2019  . Right sided numbness   . Acquired hammer toe of left foot 03/12/2017  . Tinea pedis of both feet 03/12/2017  . Male hypogonadism 11/14/2016  . Restless leg syndrome 11/14/2016  . Uncontrolled type 2 diabetes mellitus with diabetic polyneuropathy, without long-term current use of insulin ([place]) 11/12/2016    History reviewed. No pertinent surgical history.    History reviewed. No pertinent family history.  Social History   Tobacco Use  . Smoking status: Never Smoker  . Smokeless tobacco: Never Used  Vaping Use  . Vaping Use: Never used  Substance Use Topics  . Alcohol use: No  . Drug use: Never    Home Medications Prior to Admission medications   Medication Sig Start Date End Date Taking? Authorizing Provider  amLODipine (NORVASC) 10 MG tablet Take 1 tablet (10 mg total) by mouth  daily. 11/22/19 12/22/19  Revankar, Reita Cliche, MD  aspirin EC 81 MG tablet Take 1 tablet (81 mg total) by mouth daily. 10/31/19 10/30/20  Elodia Florence., MD  atorvastatin (LIPITOR) 80 MG tablet Take 1 tablet (80 mg total) by mouth daily. 11/01/19 12/01/19  Elodia Florence., MD  carvedilol (COREG) 3.125 MG tablet Take 1 tablet (3.125 mg total) by mouth 2 (two) times daily with a meal. 12/03/19   Camillia Herter, NP  clopidogrel (PLAVIX) 75 MG tablet Take 1 tablet (75 mg total) by mouth daily. 11/01/19 01/30/20  Elodia Florence., MD  gabapentin (NEURONTIN) 100 MG capsule Take 100 mg by mouth at bedtime.     [provider]  hydrochlorothiazide (HYDRODIURIL) 25 MG tablet Take 1 tablet (25 mg total) by mouth daily. 11/10/19   Charlesetta Shanks, MD  losartan (COZAAR) 25 MG tablet Take 2 tablets (50 mg total) by mouth daily. 11/10/19 12/10/19  Charlesetta Shanks, MD  metFORMIN (GLUCOPHAGE) 500 MG tablet Take 500 mg by mouth 2 (two) times daily with a meal.    [provider]  Multiple Vitamin (MULTIVITAMIN WITH MINERALS) TABS tablet Take 1 tablet by mouth daily with breakfast.    [provider]  mupirocin cream (BACTROBAN) 2 % Apply 1 application topically daily. 12/03/19   Camillia Herter, NP  VITAMIN D PO Take 1 tablet by mouth daily with supper.  [provider]  VITAMIN E PO Take 1 capsule by mouth daily with lunch.    [provider]    Allergies    Patient has no known allergies.  Review of Systems   Review of Systems  All other systems reviewed and are negative.   Physical Exam Updated Vital Signs BP (!) 166/81   Pulse 73   Temp 98.4 F (36.9 C) (Oral)   Resp 14   Wt 75.4 kg   SpO2 98%   BMI 24.90 kg/m   Physical Exam Constitutional:      General: He is awake. He is not in acute distress. HENT:     Head: Normocephalic and atraumatic.  Eyes:     General: Lids are normal.     Conjunctiva/sclera: Conjunctivae normal.  Cardiovascular:      Rate and Rhythm: Normal rate and regular rhythm.     Heart sounds: Normal heart sounds.  Pulmonary:     Effort: Pulmonary effort is normal.     Breath sounds: Normal breath sounds.  Abdominal:     General: Bowel sounds are normal. There is no distension.     Palpations: Abdomen is soft.     Tenderness: There is no abdominal tenderness.  Musculoskeletal:     Right lower leg: No edema.     Left lower leg: 1+ Pitting Edema present.  Feet:     Comments: Entire L 5th toe necrotic w/ open, purulent wound. Skin:    Comments: Open abrasion on lateral left lower leg. No erythema, drainage.  Neurological:     Mental Status: He is alert and oriented to person, place, and time.     Comments: No sensation on L 5th toe. Normal sensation on L toes 1-4.   Psychiatric:        Behavior: Behavior is cooperative.        Cognition and Memory: Cognition normal.        ED Results / Procedures / Treatments   Labs (all labs ordered are listed, but only abnormal results are displayed) Labs Reviewed  CBC WITH DIFFERENTIAL/PLATELET - Abnormal; Notable for the following components:      Result Value   RBC 3.84 (*)    Hemoglobin 10.4 (*)    HCT 31.7 (*)    All other components within normal limits  BASIC METABOLIC PANEL - Abnormal; Notable for the following components:   Glucose, Bld 150 (*)    Creatinine, Ser 1.43 (*)    Calcium 8.8 (*)    GFR calc non Af Amer 53 (*)    All other components within normal limits  SEDIMENTATION RATE - Abnormal; Notable for the following components:   Sed Rate 81 (*)    All other components within normal limits  SARS CORONAVIRUS 2 BY RT PCR (HOSPITAL ORDER, [facility])  C-REACTIVE PROTEIN    EKG None  Radiology DG Foot Complete Left  Result Date: 12/19/2019 CLINICAL DATA:  Left fifth digit pain EXAM: LEFT FOOT - COMPLETE 3+ VIEW COMPARISON:  None. FINDINGS: Frontal, oblique, and lateral views of the left foot are obtained. There  are no acute displaced fractures. There is decreased soft tissue of the fifth digit, concerning for necrosis. Minimal subcutaneous gas is seen within the lateral aspect of the fifth digit. There is subtle cortical erosion lateral margin distal aspect fifth proximal phalanx. IMPRESSION: 1. Findings concerning for gangrene of the fifth digit left foot, with subcutaneous gas and underlying fifth proximal phalangeal  cortical destruction. Superimposed infection could also give this appearance. Electronically Signed   By: Randa Ngo M.D.   On: 12/19/2019 02:34    Procedures Procedures (including critical care time)  Medications Ordered in ED Medications  fentaNYL (SUBLIMAZE) injection 50 mcg (50 mcg Intravenous Given 12/19/19 0228)  clindamycin (CLEOCIN) IVPB 600 mg (600 mg Intravenous New Bag/Given 12/19/19 0554)    ED Course  I have reviewed the triage vital signs and the nursing notes.  Pertinent labs & imaging results that were available during my care of the patient were reviewed by me and considered in my medical decision making (see chart for details).    MDM Rules/Calculators/A&P  Patient is 59yo male w/ hx of TIA, DM2 who presents w/ worsening L 5th toe pain. On exam, patient afebrile, toe is necrotic and purulent, decreased sensation. DP pulses 2+. No leukocytosis, elevated inflammatory markers on lab. L Foot XR revealed dry gangrene, subcutaneous gas. Started patient on clindamycin, consulted hospitalist for admission as patient will require IV abx.    -Patient in still in ED during shift change. Any further evaluation/management of care will be completed by day team.                       Final Clinical Impression(s) / ED Diagnoses Final diagnoses:  Toe infection    Rx / DC Orders ED Discharge Orders    None       Sanjuan Dame, MD 12/19/19 8099    Fatima Blank, MD 12/19/19 2317

## 2019-12-19 NOTE — Progress Notes (Signed)
Pharmacy Antibiotic Note  Douglas Briggs is a 59 y.o. male admitted on 12/19/2019 with dry gangrene/cellulitis.  Pharmacy has been consulted for vancomycin and pipercillin/tazobactam dosing.  Received clindamycin in ER. Subcutaneous gas and dry gangrene seen on imaging. WBC wnl, afeb.   Plan: Vancomcyin 1.5g load x1 then 750mg  q12 hr Pip/tazo 3.375 Q8 hr (EI) Monitor cultures, clinical status, renal fx, vancomycin levels as needed Narrow abx as able and f/u duration     Weight: 75.4 kg (166 lb 3.2 oz)  Temp (24hrs), Avg:98.1 F (36.7 C), Min:97.7 F (36.5 C), Max:98.4 F (36.9 C)  Recent Labs  Lab 12/19/19 0229 12/19/19 1118  WBC 6.5 4.7  CREATININE 1.43* 1.20    Estimated Creatinine Clearance: 65.3 mL/min (by C-G formula based on SCr of 1.2 mg/dL).    No Known Allergies  Antimicrobials this admission: Vanc 7/25 >>  Piptazo 7/25 >>  Clinda x1 7/25    Microbiology results: 7/25 COVID neg     Thank you for allowing pharmacy to be a part of this patient's care.  Benetta Spar, PharmD, BCPS, BCCP Clinical Pharmacist  Please check AMION for all [place] phone numbers After 10:00 PM, call [place] [phone]

## 2019-12-19 NOTE — Plan of Care (Signed)
?  Problem: Clinical Measurements: ?Goal: Ability to avoid or minimize complications of infection will improve ?Outcome: Progressing ?  ?Problem: Skin Integrity: ?Goal: Skin integrity will improve ?Outcome: Progressing ?  ?

## 2019-12-19 NOTE — ED Triage Notes (Signed)
Left toe pain, states he thinks something bit him a couple days ago. States he was given some cream for it by PCP and it turned black.

## 2019-12-20 ENCOUNTER — Inpatient Hospital Stay (HOSPITAL_COMMUNITY): Payer: No Typology Code available for payment source | Admitting: Anesthesiology

## 2019-12-20 ENCOUNTER — Encounter (HOSPITAL_COMMUNITY)
Admission: EM | Disposition: A | Discharge status: Home / Self Care | Payer: Self-pay | Source: Home / Self Care | Attending: Internal Medicine

## 2019-12-20 DIAGNOSIS — I10 Essential (primary) hypertension: Secondary | ICD-10-CM | POA: Diagnosis not present

## 2019-12-20 DIAGNOSIS — I96 Gangrene, not elsewhere classified: Secondary | ICD-10-CM | POA: Diagnosis not present

## 2019-12-20 DIAGNOSIS — E118 Type 2 diabetes mellitus with unspecified complications: Secondary | ICD-10-CM

## 2019-12-20 DIAGNOSIS — L089 Local infection of the skin and subcutaneous tissue, unspecified: Secondary | ICD-10-CM

## 2019-12-20 DIAGNOSIS — D649 Anemia, unspecified: Secondary | ICD-10-CM

## 2019-12-20 DIAGNOSIS — E7800 Pure hypercholesterolemia, unspecified: Secondary | ICD-10-CM

## 2019-12-20 DIAGNOSIS — N1831 Chronic kidney disease, stage 3a: Secondary | ICD-10-CM

## 2019-12-20 HISTORY — PX: AMPUTATION: SHX166

## 2019-12-20 LAB — GLUCOSE, CAPILLARY
Glucose-Capillary: 161 mg/dL — ABNORMAL HIGH (ref 70–99)
Glucose-Capillary: 142 mg/dL — ABNORMAL HIGH (ref 70–99)
Glucose-Capillary: 151 mg/dL — ABNORMAL HIGH (ref 70–99)
Glucose-Capillary: 189 mg/dL — ABNORMAL HIGH (ref 70–99)
Glucose-Capillary: 148 mg/dL — ABNORMAL HIGH (ref 70–99)

## 2019-12-20 LAB — COMPREHENSIVE METABOLIC PANEL
ALT: 41 U/L (ref 0–44)
AST: 18 U/L (ref 15–41)
Albumin: 2.2 g/dL — ABNORMAL LOW (ref 3.5–5.0)
Alkaline Phosphatase: 173 U/L — ABNORMAL HIGH (ref 38–126)
Anion gap: 10 (ref 5–15)
BUN: 21 mg/dL — ABNORMAL HIGH (ref 6–20)
CO2: 22 mmol/L (ref 22–32)
Calcium: 8.8 mg/dL — ABNORMAL LOW (ref 8.9–10.3)
Chloride: 105 mmol/L (ref 98–111)
Creatinine, Ser: 1.45 mg/dL — ABNORMAL HIGH (ref 0.61–1.24)
GFR calc Af Amer: >60 mL/min (ref >60)
GFR calc non Af Amer: 52 mL/min — ABNORMAL LOW (ref >60)
Glucose, Bld: 166 mg/dL — ABNORMAL HIGH (ref 70–99)
Potassium: 4.2 mmol/L (ref 3.5–5.1)
Sodium: 137 mmol/L (ref 135–145)
Total Bilirubin: 0.5 mg/dL (ref 0.3–1.2)
Total Protein: 5.9 g/dL — ABNORMAL LOW (ref 6.5–8.1)

## 2019-12-20 LAB — CBC
HCT: 33.2 % — ABNORMAL LOW (ref 39.0–52.0)
Hemoglobin: 10.6 g/dL — ABNORMAL LOW (ref 13.0–17.0)
MCH: 26.2 pg (ref 26.0–34.0)
MCHC: 31.9 g/dL (ref 30.0–36.0)
MCV: 82 fL (ref 80.0–100.0)
Platelets: 162 10*3/uL (ref 150–400)
RBC: 4.05 MIL/uL — ABNORMAL LOW (ref 4.22–5.81)
RDW: 12.6 % (ref 11.5–15.5)
WBC: 4.6 10*3/uL (ref 4.0–10.5)
nRBC: 0 % (ref 0.0–0.2)

## 2019-12-20 LAB — MAGNESIUM: Magnesium: 2 mg/dL (ref 1.7–2.4)

## 2019-12-20 LAB — PHOSPHORUS: Phosphorus: 5.2 mg/dL — ABNORMAL HIGH (ref 2.5–4.6)

## 2019-12-20 LAB — SURGICAL PCR SCREEN
MRSA, PCR: NEGATIVE
Staphylococcus aureus: NEGATIVE

## 2019-12-20 LAB — LACTIC ACID, PLASMA: Lactic Acid, Venous: 1 mmol/L (ref 0.5–1.9)

## 2019-12-20 SURGERY — AMPUTATION DIGIT
Anesthesia: General | Site: Toe | Laterality: Left

## 2019-12-20 MED ORDER — CEFAZOLIN SODIUM-DEXTROSE 2-3 GM-%(50ML) IV SOLR
INTRAVENOUS | Status: DC | PRN
Start: 2019-12-20 — End: 2019-12-20
  Administered 2019-12-20: 2 g via INTRAVENOUS

## 2019-12-20 MED ORDER — DEXAMETHASONE SODIUM PHOSPHATE 10 MG/ML IJ SOLN
INTRAMUSCULAR | Status: AC
  Filled 2019-12-20: qty 1

## 2019-12-20 MED ORDER — LIDOCAINE HCL (CARDIAC) PF 100 MG/5ML IV SOSY
PREFILLED_SYRINGE | INTRAVENOUS | Status: DC | PRN
Start: 2019-12-20 — End: 2019-12-20
  Administered 2019-12-20: 60 mg via INTRATRACHEAL

## 2019-12-20 MED ORDER — MIDAZOLAM HCL 2 MG/2ML IJ SOLN
INTRAMUSCULAR | Status: AC
  Filled 2019-12-20: qty 2

## 2019-12-20 MED ORDER — DEXAMETHASONE SODIUM PHOSPHATE 10 MG/ML IJ SOLN
INTRAMUSCULAR | Status: DC | PRN
  Administered 2019-12-20: 4 mg via INTRAVENOUS

## 2019-12-20 MED ORDER — LIDOCAINE 2% (20 MG/ML) 5 ML SYRINGE
INTRAMUSCULAR | Status: AC
  Filled 2019-12-20: qty 5

## 2019-12-20 MED ORDER — BUPIVACAINE HCL (PF) 0.25 % IJ SOLN
INTRAMUSCULAR | Status: AC
  Filled 2019-12-20: qty 30

## 2019-12-20 MED ORDER — FENTANYL CITRATE (PF) 250 MCG/5ML IJ SOLN
INTRAMUSCULAR | Status: AC
  Filled 2019-12-20: qty 5

## 2019-12-20 MED ORDER — CEFAZOLIN SODIUM 1 G IJ SOLR
INTRAMUSCULAR | Status: AC
  Filled 2019-12-20: qty 20

## 2019-12-20 MED ORDER — ONDANSETRON HCL 4 MG/2ML IJ SOLN
INTRAMUSCULAR | Status: DC | PRN
  Administered 2019-12-20: 4 mg via INTRAVENOUS

## 2019-12-20 MED ORDER — MIDAZOLAM HCL 2 MG/2ML IJ SOLN
INTRAMUSCULAR | Status: DC | PRN
  Administered 2019-12-20: 2 mg via INTRAVENOUS

## 2019-12-20 MED ORDER — PROPOFOL 10 MG/ML IV BOLUS
INTRAVENOUS | Status: DC | PRN
  Administered 2019-12-20: 130 mg via INTRAVENOUS

## 2019-12-20 MED ORDER — PROPOFOL 10 MG/ML IV BOLUS
INTRAVENOUS | Status: AC
  Filled 2019-12-20: qty 20

## 2019-12-20 MED ORDER — ONDANSETRON HCL 4 MG/2ML IJ SOLN
INTRAMUSCULAR | Status: AC
  Filled 2019-12-20: qty 2

## 2019-12-20 MED ORDER — TRAMADOL HCL 50 MG PO TABS
50.0000 mg | ORAL_TABLET | Freq: Four times a day (QID) | ORAL | Status: DC | PRN
  Administered 2019-12-20 – 2019-12-22 (×3): 50 mg via ORAL
  Filled 2019-12-20 (×3): qty 1

## 2019-12-20 MED ORDER — PHENYLEPHRINE 40 MCG/ML (10ML) SYRINGE FOR IV PUSH (FOR BLOOD PRESSURE SUPPORT)
PREFILLED_SYRINGE | INTRAVENOUS | Status: DC | PRN
  Administered 2019-12-20: 80 ug via INTRAVENOUS

## 2019-12-20 MED ORDER — GABAPENTIN 100 MG PO CAPS
100.0000 mg | ORAL_CAPSULE | Freq: Every day | ORAL | Status: DC
  Administered 2019-12-20 – 2019-12-21 (×2): 100 mg via ORAL
  Filled 2019-12-20 (×2): qty 1

## 2019-12-20 MED ORDER — 0.9 % SODIUM CHLORIDE (POUR BTL) OPTIME
TOPICAL | Status: DC | PRN
  Administered 2019-12-20: 1000 mL

## 2019-12-20 MED ORDER — LACTATED RINGERS IV SOLN: INTRAVENOUS | Status: DC | PRN

## 2019-12-20 MED ORDER — PHENYLEPHRINE 40 MCG/ML (10ML) SYRINGE FOR IV PUSH (FOR BLOOD PRESSURE SUPPORT)
PREFILLED_SYRINGE | INTRAVENOUS | Status: AC
  Filled 2019-12-20: qty 10

## 2019-12-20 MED ORDER — ASPIRIN EC 81 MG PO TBEC
81.0000 mg | DELAYED_RELEASE_TABLET | Freq: Every day | ORAL | Status: DC
  Administered 2019-12-20 – 2019-12-22 (×3): 81 mg via ORAL
  Filled 2019-12-20 (×3): qty 1

## 2019-12-20 MED ORDER — BUPIVACAINE HCL (PF) 0.25 % IJ SOLN
INTRAMUSCULAR | Status: DC | PRN
  Administered 2019-12-20: 10 mL

## 2019-12-20 SURGICAL SUPPLY — 38 items
BNDG ELASTIC 4X5.8 VLCR STR LF (GAUZE/BANDAGES/DRESSINGS) ×3 IMPLANT
BNDG ESMARK 4X9 LF (GAUZE/BANDAGES/DRESSINGS) IMPLANT
BNDG GAUZE ELAST 4 BULKY (GAUZE/BANDAGES/DRESSINGS) ×3 IMPLANT
CHLORAPREP W/TINT 26 (MISCELLANEOUS) ×6 IMPLANT
CNTNR URN SCR LID CUP LEK RST (MISCELLANEOUS) ×1 IMPLANT
CONT SPEC 4OZ STRL OR WHT (MISCELLANEOUS) ×2
COVER SURGICAL LIGHT HANDLE (MISCELLANEOUS) ×3 IMPLANT
COVER WAND RF STERILE (DRAPES) IMPLANT
DRAPE U-SHAPE 47X51 STRL (DRAPES) ×3 IMPLANT
DRSG XEROFORM 1X8 (GAUZE/BANDAGES/DRESSINGS) ×3 IMPLANT
DURAPREP 26ML APPLICATOR (WOUND CARE) ×3 IMPLANT
ELECT CAUTERY BLADE 6.4 (BLADE) ×3 IMPLANT
ELECT REM PT RETURN 9FT ADLT (ELECTROSURGICAL) ×3
ELECTRODE REM PT RTRN 9FT ADLT (ELECTROSURGICAL) ×1 IMPLANT
GAUZE SPONGE 4X4 12PLY STRL (GAUZE/BANDAGES/DRESSINGS) ×3 IMPLANT
GLOVE BIOGEL M STRL SZ7.5 (GLOVE) ×3 IMPLANT
GLOVE INDICATOR 8.0 STRL GRN (GLOVE) ×3 IMPLANT
GOWN STRL REUS W/ TWL LRG LVL3 (GOWN DISPOSABLE) ×1 IMPLANT
GOWN STRL REUS W/ TWL XL LVL3 (GOWN DISPOSABLE) ×1 IMPLANT
GOWN STRL REUS W/TWL LRG LVL3 (GOWN DISPOSABLE) ×2
GOWN STRL REUS W/TWL XL LVL3 (GOWN DISPOSABLE) ×2
KIT BASIN OR (CUSTOM PROCEDURE TRAY) ×3 IMPLANT
KIT TURNOVER KIT B (KITS) ×3 IMPLANT
MANIFOLD NEPTUNE II (INSTRUMENTS) ×3 IMPLANT
NEEDLE 22X1 1/2 (OR ONLY) (NEEDLE) IMPLANT
NEEDLE HYPO 21X1.5 SAFETY (NEEDLE) IMPLANT
NEEDLE SPNL 22GX7 QUINCKE BK (NEEDLE) ×3 IMPLANT
NS IRRIG 1000ML POUR BTL (IV SOLUTION) ×3 IMPLANT
PACK ORTHO EXTREMITY (CUSTOM PROCEDURE TRAY) ×3 IMPLANT
PAD ARMBOARD 7.5X6 YLW CONV (MISCELLANEOUS) ×6 IMPLANT
PAD CAST 4YDX4 CTTN HI CHSV (CAST SUPPLIES) ×1 IMPLANT
PADDING CAST ABS 4INX4YD NS (CAST SUPPLIES) ×2
PADDING CAST ABS COTTON 4X4 ST (CAST SUPPLIES) ×1 IMPLANT
PADDING CAST COTTON 4X4 STRL (CAST SUPPLIES) ×2
SUT ETHILON 2 0 FS 18 (SUTURE) ×6 IMPLANT
SYR BULB EAR ULCER 3OZ GRN STR (SYRINGE) ×3 IMPLANT
SYR CONTROL 10ML LL (SYRINGE) ×3 IMPLANT
TOWEL GREEN STERILE (TOWEL DISPOSABLE) ×3 IMPLANT

## 2019-12-20 NOTE — Consult Note (Signed)
Reason for Consult: Left fifth toe gangrene with surrounding cellulitis Referring Physician: [name] is an 59 y.o. male.  HPI: Patient presented with necrosis of his left fifth toe.  He also had ulceration on the lateral aspect of his leg.  He states this was from wearing work boots with significant pressure.  He has diabetes with unknown A1c and neuropathy.  Denies fevers or chills.  Presented to the emergency department due to these wounds above.  Denies significant pain.  Past Medical History:  Diagnosis Date  . Diabetes mellitus without complication ([place])   . Neuropathy   . Stroke [facility])     History reviewed. No pertinent surgical history.  History reviewed. No pertinent family history.  Social History:  reports that he has never smoked. He has never used smokeless tobacco. He reports that he does not drink alcohol and does not use drugs.  Allergies: No Known Allergies  Medications: I have reviewed the patient's current medications.  Results for orders placed or performed during the hospital encounter of 12/19/19 (from the past 48 hour(s))  CBC with Differential/Platelet     Status: Abnormal   Collection Time: 12/19/19  2:29 AM  Result Value Ref Range   WBC 6.5 4.0 - 10.5 K/uL   RBC 3.84 (L) 4.22 - 5.81 MIL/uL   Hemoglobin 10.4 (L) 13.0 - 17.0 g/dL   HCT 31.7 (L) 39 - 52 %   MCV 82.6 80.0 - 100.0 fL   MCH 27.1 26.0 - 34.0 pg   MCHC 32.8 30.0 - 36.0 g/dL   RDW 12.7 11.5 - 15.5 %   Platelets 175 150 - 400 K/uL   nRBC 0.0 0.0 - 0.2 %   Neutrophils Relative % 59 %   Neutro Abs 3.9 1.7 - 7.7 K/uL   Lymphocytes Relative 26 %   Lymphs Abs 1.7 0.7 - 4.0 K/uL   Monocytes Relative 10 %   Monocytes Absolute 0.6 0 - 1 K/uL   Eosinophils Relative 4 %   Eosinophils Absolute 0.3 0 - 0 K/uL   Basophils Relative 1 %   Basophils Absolute 0.0 0 - 0 K/uL   Immature Granulocytes 0 %   Abs Immature Granulocytes 0.02 0.00 - 0.07 K/uL    Comment: Performed at [facility], [address] [place], [place] [address]  Basic metabolic panel     Status: Abnormal   Collection Time: 12/19/19  2:29 AM  Result Value Ref Range   Sodium 138 135 - 145 mmol/L   Potassium 4.0 3.5 - 5.1 mmol/L   Chloride 105 98 - 111 mmol/L   CO2 24 22 - 32 mmol/L   Glucose, Bld 150 (H) 70 - 99 mg/dL    Comment: Glucose reference range applies only to samples taken after fasting for at least 8 hours.   BUN 20 6 - 20 mg/dL   Creatinine, Ser 1.43 (H) 0.61 - 1.24 mg/dL   Calcium 8.8 (L) 8.9 - 10.3 mg/dL   GFR calc non Af Amer 53 (L) >60 mL/min   GFR calc Af Amer >60 >60 mL/min   Anion gap 9 5 - 15    Comment: Performed at [facility], [address] [place], [place] [address]  Sedimentation rate     Status: Abnormal   Collection Time: 12/19/19  2:29 AM  Result Value Ref Range   Sed Rate 81 (H) 0 - 16 mm/hr    Comment: Performed at [facility], [address] [place], [place] [address]  C-reactive protein     Status: Abnormal   Collection Time: 12/19/19  2:29 AM  Result Value Ref Range   CRP 1.1 (H) <1.0 mg/dL    Comment: Performed at [facility] [address], [place], [place] [address]  SARS Coronavirus 2 by RT PCR (hospital order, performed in [facility] hospital lab) Nasopharyngeal Nasopharyngeal Swab     Status: None   Collection Time: 12/19/19  4:49 AM   Specimen: Nasopharyngeal Swab  Result Value Ref Range   SARS Coronavirus 2 NEGATIVE NEGATIVE    Comment: (NOTE) SARS-CoV-2 target nucleic acids are NOT DETECTED.  The SARS-CoV-2 RNA is generally detectable in upper and lower respiratory specimens during the acute phase of infection. The lowest concentration of SARS-CoV-2 viral copies this assay can detect is 250 copies / mL. A negative result does not preclude SARS-CoV-2 infection and should not be used as the sole basis for treatment or other patient management decisions.  A negative result may occur with improper  specimen collection / handling, submission of specimen other than nasopharyngeal swab, presence of viral mutation(s) within the areas targeted by this assay, and inadequate number of viral copies (<250 copies / mL). A negative result must be combined with clinical observations, patient history, and epidemiological information.  Fact Sheet for Patients:   StrictlyIdeas.no  Fact Sheet for Healthcare Providers: BankingDealers.co.za  This test is not yet approved or  cleared by the [place] FDA and has been authorized for detection and/or diagnosis of SARS-CoV-2 by FDA under an Emergency Use Authorization (EUA).  This EUA will remain in effect (meaning this test can be used) for the duration of the COVID-19 declaration under Section 564(b)(1) of the Act, 21 U.S.C. section 360bbb-3(b)(1), unless the authorization is terminated or revoked sooner.  Performed at [facility], [address] [place], [place] [address]   CBC     Status: Abnormal   Collection Time: 12/19/19 11:18 AM  Result Value Ref Range   WBC 4.7 4.0 - 10.5 K/uL   RBC 3.79 (L) 4.22 - 5.81 MIL/uL   Hemoglobin 10.4 (L) 13.0 - 17.0 g/dL   HCT 31.1 (L) 39 - 52 %   MCV 82.1 80.0 - 100.0 fL   MCH 27.4 26.0 - 34.0 pg   MCHC 33.4 30.0 - 36.0 g/dL   RDW 12.7 11.5 - 15.5 %   Platelets 175 150 - 400 K/uL   nRBC 0.0 0.0 - 0.2 %    Comment: Performed at [facility], [address] [place], [place] [address]  Creatinine, serum     Status: None   Collection Time: 12/19/19 11:18 AM  Result Value Ref Range   Creatinine, Ser 1.20 0.61 - 1.24 mg/dL   GFR calc non Af Amer >60 >60 mL/min   GFR calc Af Amer >60 >60 mL/min    Comment: Performed at [facility], [address] [place], [place] [address]  Glucose, capillary     Status: Abnormal   Collection Time: 12/19/19  5:40 PM  Result Value Ref Range   Glucose-Capillary 108 (H) 70 - 99 mg/dL     Comment: Glucose reference range applies only to samples taken after fasting for at least 8 hours.  Glucose, capillary     Status: Abnormal   Collection Time: 12/19/19  9:13 PM  Result Value Ref Range   Glucose-Capillary 137 (H)  70 - 99 mg/dL    Comment: Glucose reference range applies only to samples taken after fasting for at least 8 hours.  Comprehensive metabolic panel     Status: Abnormal   Collection Time: 12/20/19  1:31 AM  Result Value Ref Range   Sodium 137 135 - 145 mmol/L   Potassium 4.2 3.5 - 5.1 mmol/L   Chloride 105 98 - 111 mmol/L   CO2 22 22 - 32 mmol/L   Glucose, Bld 166 (H) 70 - 99 mg/dL    Comment: Glucose reference range applies only to samples taken after fasting for at least 8 hours.   BUN 21 (H) 6 - 20 mg/dL   Creatinine, Ser 1.45 (H) 0.61 - 1.24 mg/dL   Calcium 8.8 (L) 8.9 - 10.3 mg/dL   Total Protein 5.9 (L) 6.5 - 8.1 g/dL   Albumin 2.2 (L) 3.5 - 5.0 g/dL   AST 18 15 - 41 U/L   ALT 41 0 - 44 U/L   Alkaline Phosphatase 173 (H) 38 - 126 U/L   Total Bilirubin 0.5 0.3 - 1.2 mg/dL   GFR calc non Af Amer 52 (L) >60 mL/min   GFR calc Af Amer >60 >60 mL/min   Anion gap 10 5 - 15    Comment: Performed at [facility] [address], [place], [place] [address]  CBC     Status: Abnormal   Collection Time: 12/20/19  1:31 AM  Result Value Ref Range   WBC 4.6 4.0 - 10.5 K/uL   RBC 4.05 (L) 4.22 - 5.81 MIL/uL   Hemoglobin 10.6 (L) 13.0 - 17.0 g/dL   HCT 33.2 (L) 39 - 52 %   MCV 82.0 80.0 - 100.0 fL   MCH 26.2 26.0 - 34.0 pg   MCHC 31.9 30.0 - 36.0 g/dL   RDW 12.6 11.5 - 15.5 %   Platelets 162 150 - 400 K/uL   nRBC 0.0 0.0 - 0.2 %    Comment: Performed at [facility], [place] [address] [place], [place] [address]  Lactic acid, plasma     Status: None   Collection Time: 12/20/19  1:31 AM  Result Value Ref Range   Lactic Acid, Venous 1.0 0.5 - 1.9 mmol/L    Comment: Performed at [facility] [address], [place], [place] [address]  Magnesium      Status: None   Collection Time: 12/20/19  1:31 AM  Result Value Ref Range   Magnesium 2.0 1.7 - 2.4 mg/dL    Comment: Performed at [facility] [address] [place], [place] [address]  Phosphorus     Status: Abnormal   Collection Time: 12/20/19  1:31 AM  Result Value Ref Range   Phosphorus 5.2 (H) 2.5 - 4.6 mg/dL    Comment: Performed at [facility] [address], [place], [place] [address]  Surgical pcr screen     Status: None   Collection Time: 12/20/19  1:56 AM   Specimen: Nasal Mucosa; Nasal Swab  Result Value Ref Range   MRSA, PCR NEGATIVE NEGATIVE   Staphylococcus aureus NEGATIVE NEGATIVE    Comment: (NOTE) The Xpert SA Assay (FDA approved for NASAL specimens in patients 45 years of age and older), is one component of a comprehensive surveillance program. It is not intended to diagnose infection nor to guide or monitor treatment. Performed at [facility], [place] [address], [place], [place] [address]   Glucose,  capillary     Status: Abnormal   Collection Time: 12/20/19  4:24 AM  Result Value Ref Range   Glucose-Capillary 161 (H) 70 - 99 mg/dL    Comment: Glucose reference range applies only to samples taken after fasting for at least 8 hours.    DG Foot Complete Left  Result Date: 12/19/2019 CLINICAL DATA:  Left fifth digit pain EXAM: LEFT FOOT - COMPLETE 3+ VIEW COMPARISON:  None. FINDINGS: Frontal, oblique, and lateral views of the left foot are obtained. There are no acute displaced fractures. There is decreased soft tissue of the fifth digit, concerning for necrosis. Minimal subcutaneous gas is seen within the lateral aspect of the fifth digit. There is subtle cortical erosion lateral margin distal aspect fifth proximal phalanx. IMPRESSION: 1. Findings concerning for gangrene of the fifth digit left foot, with subcutaneous gas and underlying fifth proximal phalangeal cortical destruction. Superimposed infection could also give this appearance.  Electronically Signed   By: Randa Ngo M.D.   On: 12/19/2019 02:34    Review of Systems  Constitutional: Negative.   HENT: Negative.   Eyes: Negative.   Respiratory: Negative.   Cardiovascular: Negative.   Gastrointestinal: Negative.   Musculoskeletal:       Left fifth toe necrosis Lateral leg ulceration  Skin: Positive for wound.  Neurological: Positive for numbness.  Psychiatric/Behavioral: Negative.    Blood pressure (!) 129/77, pulse 88, temperature (!) 97.4 F (36.3 C), temperature source Oral, resp. rate 19, height 5\' 7"  (1.702 m), weight 74.1 kg, SpO2 100 %. Physical Exam HENT:     Head: Normocephalic.     Mouth/Throat:     Mouth: Mucous membranes are moist.  Eyes:     Extraocular Movements: Extraocular movements intact.  Cardiovascular:     Rate and Rhythm: Normal rate.  Abdominal:     General: Abdomen is flat.  Musculoskeletal:     Cervical back: Neck supple.     Comments: Left foot with dry and wet gangrene of the left fifth toe.  Surrounding cellulitis and edema with concern for possible deep abscess.  There is a 4 cm superficial ulceration on the lateral aspect of his leg.  There is no evidence of infection of this ulcer.  Sensory deficits present.  Remaining toes are well perfused.  Brisk capillary refill of the nonnecrotic toes.  Neurological:     Mental Status: He is alert.     Sensory: Sensory deficit present.  Psychiatric:        Mood and Affect: Mood normal.     Assessment/Plan: We'll proceed with amputation of the left fifth toe and drainage of abscess if present.  We'll plan to wash the ulceration on the lateral aspect of his foot and treat this without surgery.  Will likely require MTP disarticulation versus possible lateral ray resection depending on bony involvement.  Patient understands the risks, benefits and alternatives of surgery which include but not limited to wound healing complications, continued infection, need for further surgery.  He  understands that if the ulceration on the lateral aspect of his leg becomes deeply infected that he may be a candidate for below the knee amputation.  We'll proceed with surgery.  Douglas Briggs 12/20/2019, 7:10 AM

## 2019-12-20 NOTE — Op Note (Signed)
Douglas Briggs male 59 y.o. 12/20/2019  PreOperative Diagnosis: Left fifth toe gangrene Left lateral leg ulceration  PostOperative Diagnosis: Left fifth toe gangrene Left forefoot abscess, multiple bursal spaces Left lateral leg ulceration  PROCEDURE: Left fifth toe MTP disarticulation Incision and drainage left forefoot abscess, multiple bursal spaces Irrigation and debridement, skin and subcutaneous tissue of left lateral leg ulceration  SURGEON: Melony Overly, MD  ASSISTANT: None  ANESTHESIA: General LMA with local infiltration of quarter percent Marcaine  FINDINGS: See below  IMPLANTS: None  INDICATIONS:58 y.o. male presented to the emergency department with left fifth toe gangrene and lateral leg ulceration.  Patient is a known diabetic with peripheral neuropathy.  He states he was wearing work boots which resulted in the ulceration of his fifth toe.  He also had ulceration on the lateral leg which he thinks was from a bug bite.  We discussed conservative treatment but ultimately he was indicated for surgery given the necrosis and gangrene of the fifth toe.   Patient understood the risks, benefits and alternatives to surgery which include but are not limited to wound healing complications, infection, nonunion, malunion, need for further surgery as well as damage to surrounding structures. They also understood the potential for continued pain in that there were no guarantees of acceptable outcome After weighing these risks the patient opted to proceed with surgery.  PROCEDURE: Patient was identified in the preoperative holding area.  The left foot was marked by myself.  Consent was signed by myself and the patient. Patient was taken to the operative suite and placed supine on the operative table.  General LMA anesthesia was induced without difficulty. Bump was placed under the operative hip.  All bony prominences were well padded.  Preoperative antibiotics were given. The  extremity was prepped and draped in the usual sterile fashion and surgical timeout was performed.   We began by making an incision along the viable skin and necrotic skin of the fifth toe sharply down to bone.  The soft tissue was incised circumferentially around the toe at the base of the toe.  Then deep dissection was performed to disarticulate the MTP joint.  The specimen was removed and sent for culture.    Then inspection was carried out there was found to be an abscess cavity on the plantar aspect of the forefoot below the fifth metatarsal head.  Incision was made in this area and there was small rush of fluid.  The Metzen[place]ums scissors were then placed within the abscess cavity and spread and the abscess was deloculated multiple bursal spaces.  Then this area was irrigated copiously with normal saline.  The skin edges were bleeding and appeared to be viable.  The site was then closed with a 2-0 nylon stitch.  This was done in an interrupted fashion.  Minimal tension on the amputation flaps.  Then turned our attention to the lateral leg.  There was an ulceration on the lateral leg that was superficial and down to the dermal layers.  Using a 10 blade and pickups the ulceration was debrided including skin and subcutaneous tissue.  There was good bleeding and healthy appearing skin.  This was then irrigated copiously with normal saline.  There was no purulence or surrounding erythema at the site.  Then Xeroform was placed on the wounds and soft dressing was placed.  All counts were correct at the end of the case.  There are no complications.  Patient was awakened from anesthesia and taken recovery in stable condition.  He will be given a postoperative shoe.  POST OPERATIVE INSTRUCTIONS: Heel weightbearing to the left lower extremity Continue antibiotics per hospitalist team Okay for discharge from orthopedic standpoint Follow-up in 2 weeks for wound check.  Sutures likely to remain for 4  weeks.  TOURNIQUET TIME: No tourniquet was used  BLOOD LOSS:  Minimal         DRAINS: none         SPECIMEN: none       COMPLICATIONS:  * No complications entered in OR log *         Disposition: PACU - hemodynamically stable.         Condition: stable

## 2019-12-20 NOTE — Anesthesia Procedure Notes (Signed)
Procedure Name: LMA Insertion Date/Time: 12/20/2019 7:31 AM Performed by: Renato Shin, CRNA Pre-anesthesia Checklist: Patient identified, Emergency Drugs available, Suction available and Patient being monitored Patient Re-evaluated:Patient Re-evaluated prior to induction Oxygen Delivery Method: Circle system utilized Preoxygenation: Pre-oxygenation with 100% oxygen Induction Type: IV induction LMA: LMA inserted LMA Size: 4.0 Placement Confirmation: positive ETCO2 and breath sounds checked- equal and bilateral Tube secured with: Tape Dental Injury: Teeth and Oropharynx as per pre-operative assessment

## 2019-12-20 NOTE — Progress Notes (Signed)
Orthopedic Tech Progress Note Patient Details:  Douglas Briggs 1960/08/09 [id number] Applied in PACU Ortho Devices Type of Ortho Device: Postop shoe/boot Ortho Device/Splint Location: LLE Ortho Device/Splint Interventions: Ordered, Application   Post Interventions Patient Tolerated: Well Instructions Provided: Care of device   [name] 12/20/2019, 8:51 AM

## 2019-12-20 NOTE — Anesthesia Postprocedure Evaluation (Signed)
Anesthesia Post Note  Patient: Sidi Dzikowski  Procedure(s) Performed: AMPUTATION 5TH TOE (Left Toe)     Patient location during evaluation: PACU Anesthesia Type: General Level of consciousness: awake Pain management: pain level controlled Vital Signs Assessment: post-procedure vital signs reviewed and stable Respiratory status: spontaneous breathing, nonlabored ventilation, respiratory function stable and patient connected to nasal cannula oxygen Cardiovascular status: blood pressure returned to baseline and stable Postop Assessment: no apparent nausea or vomiting Anesthetic complications: no   No complications documented.  Last Vitals:  Vitals:   12/20/19 0844 12/20/19 1211  BP: (!) 170/83 124/82  Pulse: 63 87  Resp: 13 16  Temp: (!) 36.3 C 36.8 C  SpO2: 100% 100%    Last Pain:  Vitals:   12/20/19 1545  TempSrc:   PainSc: 5                  Jen Benedict P Odyssey Vasbinder

## 2019-12-20 NOTE — Progress Notes (Signed)
PROGRESS NOTE    Douglas Briggs  ERX:[id number] DOB: 1960/12/22 DOA: 12/19/2019 PCP: Patient, No Pcp Per     Brief Narrative:  59 y.o. BM PMHx  CVA, DM type II controlled with complication, DM neuropathy, essential HTN, HLD,,   Presents to emergency department with left fifth toe pain since couple of weeks and it turned black 2 days ago.  He tells me that his work boots caused a callus.  He says he pulled callus causing an open wound.  He was evaluated by his PCP on 7/9.  Reports that his left fifth toe turned black 2 days ago associated with some swelling and redness in left lower extremity.  Reports that pain is severe in intensity however denies association with fever, chills, nausea, vomiting, generalized weakness or lethargy.  He also reports that he was bitten by a bug?  A few days ago on left leg and having an open wound.  No history of headache, blurry vision, chest pain, shortness of breath, palpitation, abdominal pain, urinary or bowel changes.  No history of smoking, alcohol, illicit drug use.  ED Course: Upon arrival to ED: Patient afebrile with no leukocytosis.  Blood pressure was noted to be elevated in 190s/90s.  ESR: 81, CRP: 1.1.  COVID-19 negative.  X-ray shows gangrenous changes and fifth digit of left foot.  Patient received fentanyl and clindamycin in ED.  Patient transferred to [facility] for further evaluation and management.   Subjective: A/O x4, negative CP, negative S OB, negative abdominal pain.  States codeine/narcotics too strong  gives him a headache.   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 -Anemia panel pending -Occult blood pending  Hx CVA -See HLD -ASA 81 mg                  DVT prophylaxis: Lovenox Code Status: Full Family Communication:  Status is: Inpatient    Dispo: The patient is from: Home              Anticipated d/c is to: Home              Anticipated d/c date is: Per surgery              Patient currently unstable      Consultants:  Orthopedic surgery  Procedures/Significant Events:  7/26 s/p-left fifth toe MTP disarticulation -Incision and drainage left forefoot abscess, multiple bursal spaces -Irrigation and debridement, skin and subcutaneous tissue of left lateral leg ulceration   I have personally reviewed and interpreted all radiology studies and my findings are as above.  VENTILATOR SETTINGS:    Cultures   Antimicrobials:  Anti-infectives (From admission, onward)   Start     Ordered Stop   12/20/19 0600  vancomycin (VANCOREADY) IVPB 750 mg/150 mL     Discontinue     12/19/19 1713     12/20/19 0000  piperacillin-tazobactam (ZOSYN) IVPB 3.375 g     Discontinue     12/19/19 1713     12/19/19 1715  vancomycin (VANCOREADY) IVPB 1250 mg/250 mL        12/19/19 1712 12/19/19 2026   12/19/19 1700  piperacillin-tazobactam (ZOSYN) IVPB 3.375 g        12/19/19 1658 12/19/19 1836   12/19/19 1700  vancomycin (VANCOCIN) IVPB 1000 mg/200 mL premix  Status:  Discontinued         12/19/19 1658 12/19/19 1712   12/19/19 0530  clindamycin (CLEOCIN) IVPB 600 mg        12/19/19 0523 12/19/19 0720       Devices    LINES / TUBES:      Continuous Infusions: . piperacillin-tazobactam (ZOSYN)  IV 3.375 g (12/20/19 0852)  . vancomycin Stopped (12/20/19 0620)     Objective: Vitals:   12/20/19 0815 12/20/19 0821 12/20/19 0844 12/20/19 1211  BP: (!) 162/80 (!) 155/84 (!) 170/83 124/82  Pulse: 71 72 63 87  Resp: 15 13 13 16   Temp:  (!) 97.2 F (36.2 C) (!) 97.4 F (36.3 C) 98.3 F (36.8 C)  TempSrc:   Oral Oral  SpO2: 100% 100% 100% 100%  Weight:      Height:        Intake/Output Summary (Last 24 hours) at 12/20/2019 1434 Last data filed at 12/20/2019 0712 Gross per 24 hour  Intake 1435 ml  Output 1420 ml  Net 15 ml   Filed Weights   12/19/19 0003 12/19/19 1726 12/20/19 0426  Weight: 75.4 kg 71.4 kg 74.1 kg    Examination:  General: A/O x4, No acute respiratory distress Eyes: negative scleral hemorrhage, negative anisocoria, negative icterus ENT: Negative Runny nose, negative gingival bleeding, Neck:  Negative scars, masses, torticollis, lymphadenopathy, JVD Lungs: Clear to auscultation bilaterally without wheezes or crackles Cardiovascular: Regular rate and rhythm without murmur gallop or rub normal S1 and S2 Abdomen: negative abdominal pain, nondistended, positive soft, bowel sounds, no rebound, no ascites, no appreciable mass Extremities: LEFT foot in a boot with an Ace bandage wrap did not take wrap down.  Toes are warm to touch patient able to wiggle toes. Skin: Negative rashes, lesions, ulcers Psychiatric:  Negative depression, negative anxiety, negative fatigue, negative mania  Central nervous system:  Cranial nerves II through XII intact, tongue/uvula midline, all extremities muscle strength 5/5, sensation intact throughout, negative dysarthria, negative expressive aphasia, negative receptive aphasia.  .     Data Reviewed:  Care during the described time interval was provided by me .  I have reviewed this patient's available data, including medical history, events of note, physical examination, and all test results as part of my evaluation.  CBC: Recent Labs  Lab 12/19/19 0229 12/19/19 1118 12/20/19 0131  WBC 6.5 4.7 4.6  NEUTROABS 3.9  --   --   HGB 10.4* 10.4* 10.6*  HCT 31.7* 31.1* 33.2*  MCV 82.6 82.1 82.0  PLT 175 175 197   Basic Metabolic Panel: Recent Labs  Lab 12/19/19 0229 12/19/19 1118 12/20/19 0131  NA 138  --  137  K 4.0  --  4.2  CL 105  --  105  CO2 24  --  22  GLUCOSE 150*  --  166*  BUN 20  --  21*  CREATININE 1.43* 1.20 1.45*  CALCIUM 8.8*  --  8.8*  MG  --   --  2.0  PHOS  --   --  5.2*   GFR: Estimated Creatinine Clearance: 51.3 mL/min (A) (by C-G formula based on SCr of 1.45 mg/dL (H)). Liver Function Tests: Recent Labs  Lab 12/20/19 0131  AST 18  ALT 41  ALKPHOS 173*  BILITOT 0.5  PROT 5.9*  ALBUMIN 2.2*   No results for input(s): LIPASE, AMYLASE in the last 168 hours. No results for input(s): AMMONIA in the last 168 hours. Coagulation Profile: No results for input(s): INR, PROTIME in the last 168 hours. Cardiac Enzymes: No results for input(s): CKTOTAL, CKMB, CKMBINDEX, TROPONINI in the last 168 hours. BNP (last 3 results) No results for input(s): PROBNP in the last 8760 hours. HbA1C: No results for input(s): HGBA1C in the last 72 hours. CBG: Recent Labs  Lab 12/19/19 1740 12/19/19 2113 12/20/19 0424 12/20/19 0802 12/20/19 1207  GLUCAP 108* 137* 161* 142* 151*   Lipid Profile: No results for input(s): CHOL, HDL, LDLCALC, TRIG, CHOLHDL, LDLDIRECT in the last 72 hours. Thyroid Function Tests: No results for input(s): TSH, T4TOTAL, FREET4, T3FREE, THYROIDAB in the last 72 hours. Anemia Panel: No results for input(s): VITAMINB12, FOLATE, FERRITIN, TIBC, IRON, RETICCTPCT in the last 72 hours. Sepsis Labs: Recent Labs  Lab 12/20/19 0131   LATICACIDVEN 1.0    Recent Results (from the past 240 hour(s))  SARS Coronavirus 2 by RT PCR (hospital order, performed in [facility] hospital lab) Nasopharyngeal Nasopharyngeal Swab     Status: None   Collection Time: 12/19/19  4:49 AM   Specimen: Nasopharyngeal Swab  Result Value Ref Range Status   SARS Coronavirus 2 NEGATIVE NEGATIVE Final    Comment: (NOTE) SARS-CoV-2 target nucleic acids are NOT DETECTED.  The SARS-CoV-2 RNA is generally detectable in upper and lower respiratory specimens during the acute phase of infection. The lowest concentration of SARS-CoV-2 viral copies this assay can detect is 250 copies / mL. A negative result does not preclude SARS-CoV-2 infection and should not be used as the sole basis for treatment or other patient management decisions.  A negative result may occur with improper specimen collection / handling, submission of specimen other than nasopharyngeal swab, presence of viral mutation(s) within the areas targeted by this assay, and inadequate number of viral copies (<250 copies / mL). A negative result must be combined with clinical observations, patient history, and epidemiological information.  Fact Sheet for Patients:   StrictlyIdeas.no  Fact Sheet for Healthcare Providers: BankingDealers.co.za  This test is not yet approved or  cleared by the [place] FDA and has been authorized for detection and/or diagnosis of SARS-CoV-2 by FDA under an Emergency Use Authorization (EUA).  This EUA will remain in effect (meaning this test can be used) for the duration of the COVID-19 declaration under Section 564(b)(1) of the Act, 21 U.S.C. section 360bbb-3(b)(1), unless the authorization is terminated or revoked sooner.  Performed at [facility], [address], [place], [place] [address]   Surgical pcr screen     Status: None   Collection Time: 12/20/19  1:56 AM   Specimen: Nasal  Mucosa; Nasal Swab  Result Value Ref Range Status   MRSA, PCR NEGATIVE NEGATIVE Final   Staphylococcus aureus NEGATIVE NEGATIVE Final    Comment: (NOTE) The Xpert SA Assay (FDA approved for NASAL specimens in patients 22 years of  age and older), is one component of a comprehensive surveillance program. It is not intended to diagnose infection nor to guide or monitor treatment. Performed at [facility], [place] [address], [place], [place] [address]   Aerobic/Anaerobic Culture (surgical/deep wound)     Status: None (Preliminary result)   Collection Time: 12/20/19  7:38 AM   Specimen: PATH Digit amputation; Tissue  Result Value Ref Range Status   Specimen Description TISSUE LEFT TOE  Final   Special Requests FIFTH TOE  Final   Gram Stain   Final    MODERATE WBC PRESENT,BOTH PMN AND MONONUCLEAR MODERATE SQUAMOUS EPITHELIAL CELLS PRESENT FEW GRAM POSITIVE RODS RARE GRAM POSITIVE COCCI IN PAIRS Performed at [facility], [place] [address], [place], [place] [address]    Culture PENDING  Incomplete   Report Status PENDING  Incomplete         Radiology Studies: DG Foot Complete Left  Result Date: 12/19/2019 CLINICAL DATA:  Left fifth digit pain EXAM: LEFT FOOT - COMPLETE 3+ VIEW COMPARISON:  None. FINDINGS: Frontal, oblique, and lateral views of the left foot are obtained. There are no acute displaced fractures. There is decreased soft tissue of the fifth digit, concerning for necrosis. Minimal subcutaneous gas is seen within the lateral aspect of the fifth digit. There is subtle cortical erosion lateral margin distal aspect fifth proximal phalanx. IMPRESSION: 1. Findings concerning for gangrene of the fifth digit left foot, with subcutaneous gas and underlying fifth proximal phalangeal cortical destruction. Superimposed infection could also give this appearance. Electronically Signed   By: Randa Ngo M.D.   On: 12/19/2019 02:34        Scheduled Meds: . amLODipine  10 mg  Oral Daily  . atorvastatin  80 mg Oral Daily  . carvedilol  3.125 mg Oral BID WC  . enoxaparin (LOVENOX) injection  40 mg Subcutaneous Q24H  . hydrochlorothiazide  25 mg Oral Daily  . insulin aspart  0-15 Units Subcutaneous TID WC  . insulin aspart  0-5 Units Subcutaneous QHS  . losartan  50 mg Oral Daily   Continuous Infusions: . piperacillin-tazobactam (ZOSYN)  IV 3.375 g (12/20/19 0852)  . vancomycin Stopped (12/20/19 0620)     LOS: 1 day    Time spent:40 min    Ercilia Bettinger, Geraldo Docker, MD [facility] Pager [phone]  If 7PM-7AM, please contact night-coverage [email or web address] Password [facility] 12/20/2019, 2:34 PM

## 2019-12-20 NOTE — Plan of Care (Signed)
?  Problem: Clinical Measurements: ?Goal: Ability to avoid or minimize complications of infection will improve ?Outcome: Progressing ?  ?Problem: Skin Integrity: ?Goal: Skin integrity will improve ?Outcome: Progressing ?  ?

## 2019-12-20 NOTE — Transfer of Care (Signed)
Immediate Anesthesia Transfer of Care Note  Patient: Douglas Briggs  Procedure(s) Performed: AMPUTATION 5TH TOE (Left Toe)  Patient Location: PACU  Anesthesia Type:General  Level of Consciousness: drowsy and patient cooperative  Airway & Oxygen Therapy: Patient Spontanous Breathing and Patient connected to nasal cannula oxygen  Post-op Assessment: Report given to RN and Post -op Vital signs reviewed and stable  Post vital signs: Reviewed and stable  Last Vitals:  Vitals Value Taken Time  BP 123/68 12/20/19 0801  Temp    Pulse 70 12/20/19 0802  Resp 14 12/20/19 0802  SpO2 100 % 12/20/19 0802  Vitals shown include unvalidated device data.  Last Pain:  Vitals:   12/20/19 0426  TempSrc: Oral  PainSc:          Complications: No complications documented.

## 2019-12-21 ENCOUNTER — Encounter (HOSPITAL_COMMUNITY): Payer: Self-pay | Admitting: Orthopaedic Surgery

## 2019-12-21 DIAGNOSIS — N1831 Chronic kidney disease, stage 3a: Secondary | ICD-10-CM | POA: Diagnosis not present

## 2019-12-21 DIAGNOSIS — I96 Gangrene, not elsewhere classified: Secondary | ICD-10-CM | POA: Diagnosis not present

## 2019-12-21 DIAGNOSIS — E118 Type 2 diabetes mellitus with unspecified complications: Secondary | ICD-10-CM | POA: Diagnosis not present

## 2019-12-21 DIAGNOSIS — I10 Essential (primary) hypertension: Secondary | ICD-10-CM | POA: Diagnosis not present

## 2019-12-21 LAB — RETICULOCYTES
Immature Retic Fract: 6.7 % (ref 2.3–15.9)
RBC.: 3.52 MIL/uL — ABNORMAL LOW (ref 4.22–5.81)
Retic Count, Absolute: 39.1 10*3/uL (ref 19.0–186.0)
Retic Ct Pct: 1.1 % (ref 0.4–3.1)

## 2019-12-21 LAB — CBC WITH DIFFERENTIAL/PLATELET
Abs Immature Granulocytes: 0.02 10*3/uL (ref 0.00–0.07)
Basophils Absolute: 0 10*3/uL (ref 0.0–0.1)
Basophils Relative: 0 %
Eosinophils Absolute: 0 10*3/uL (ref 0.0–0.5)
Eosinophils Relative: 0 %
HCT: 28.8 % — ABNORMAL LOW (ref 39.0–52.0)
Hemoglobin: 9.6 g/dL — ABNORMAL LOW (ref 13.0–17.0)
Immature Granulocytes: 0 %
Lymphocytes Relative: 18 %
Lymphs Abs: 1.1 10*3/uL (ref 0.7–4.0)
MCH: 27.4 pg (ref 26.0–34.0)
MCHC: 33.3 g/dL (ref 30.0–36.0)
MCV: 82.3 fL (ref 80.0–100.0)
Monocytes Absolute: 0.5 10*3/uL (ref 0.1–1.0)
Monocytes Relative: 9 %
Neutro Abs: 4.2 10*3/uL (ref 1.7–7.7)
Neutrophils Relative %: 73 %
Platelets: 170 10*3/uL (ref 150–400)
RBC: 3.5 MIL/uL — ABNORMAL LOW (ref 4.22–5.81)
RDW: 12.7 % (ref 11.5–15.5)
WBC: 5.8 10*3/uL (ref 4.0–10.5)
nRBC: 0 % (ref 0.0–0.2)

## 2019-12-21 LAB — MAGNESIUM: Magnesium: 2.2 mg/dL (ref 1.7–2.4)

## 2019-12-21 LAB — GLUCOSE, CAPILLARY
Glucose-Capillary: 179 mg/dL — ABNORMAL HIGH (ref 70–99)
Glucose-Capillary: 152 mg/dL — ABNORMAL HIGH (ref 70–99)
Glucose-Capillary: 107 mg/dL — ABNORMAL HIGH (ref 70–99)
Glucose-Capillary: 198 mg/dL — ABNORMAL HIGH (ref 70–99)

## 2019-12-21 LAB — IRON AND TIBC
Iron: 50 ug/dL (ref 45–182)
Saturation Ratios: 21 % (ref 17.9–39.5)
TIBC: 244 ug/dL — ABNORMAL LOW (ref 250–450)
UIBC: 194 ug/dL

## 2019-12-21 LAB — COMPREHENSIVE METABOLIC PANEL
ALT: 32 U/L (ref 0–44)
AST: 19 U/L (ref 15–41)
Albumin: 2 g/dL — ABNORMAL LOW (ref 3.5–5.0)
Alkaline Phosphatase: 155 U/L — ABNORMAL HIGH (ref 38–126)
Anion gap: 8 (ref 5–15)
BUN: 26 mg/dL — ABNORMAL HIGH (ref 6–20)
CO2: 23 mmol/L (ref 22–32)
Calcium: 8.5 mg/dL — ABNORMAL LOW (ref 8.9–10.3)
Chloride: 105 mmol/L (ref 98–111)
Creatinine, Ser: 1.95 mg/dL — ABNORMAL HIGH (ref 0.61–1.24)
GFR calc Af Amer: 42 mL/min — ABNORMAL LOW (ref >60)
GFR calc non Af Amer: 37 mL/min — ABNORMAL LOW (ref >60)
Glucose, Bld: 198 mg/dL — ABNORMAL HIGH (ref 70–99)
Potassium: 4.4 mmol/L (ref 3.5–5.1)
Sodium: 136 mmol/L (ref 135–145)
Total Bilirubin: 0.5 mg/dL (ref 0.3–1.2)
Total Protein: 5.7 g/dL — ABNORMAL LOW (ref 6.5–8.1)

## 2019-12-21 LAB — LIPID PANEL
Cholesterol: 193 mg/dL (ref 0–200)
HDL: 44 mg/dL (ref >40)
LDL Cholesterol: 126 mg/dL — ABNORMAL HIGH (ref 0–99)
Total CHOL/HDL Ratio: 4.4 RATIO
Triglycerides: 115 mg/dL (ref <150)
VLDL: 23 mg/dL (ref 0–40)

## 2019-12-21 LAB — FERRITIN: Ferritin: 280 ng/mL (ref 24–336)

## 2019-12-21 LAB — VITAMIN B12: Vitamin B-12: 473 pg/mL (ref 180–914)

## 2019-12-21 LAB — PHOSPHORUS: Phosphorus: 4.7 mg/dL — ABNORMAL HIGH (ref 2.5–4.6)

## 2019-12-21 LAB — FOLATE: Folate: 11.8 ng/mL (ref >5.9)

## 2019-12-21 MED ORDER — SODIUM CHLORIDE 0.9 % IV SOLN
2.0000 g | INTRAVENOUS | Status: DC
  Administered 2019-12-21 – 2019-12-22 (×2): 2 g via INTRAVENOUS
  Filled 2019-12-21 (×2): qty 2

## 2019-12-21 MED ORDER — LINEZOLID 600 MG PO TABS
600.0000 mg | ORAL_TABLET | Freq: Two times a day (BID) | ORAL | Status: DC
  Administered 2019-12-21 – 2019-12-22 (×3): 600 mg via ORAL
  Filled 2019-12-21 (×4): qty 1

## 2019-12-21 NOTE — Progress Notes (Signed)
PROGRESS NOTE    Douglas Briggs  DGL:[id number] DOB: 06-Aug-1960 DOA: 12/19/2019 PCP: Patient, No Pcp Per     Brief Narrative:  59 y.o. BM PMHx  CVA, DM type II controlled with complication, DM neuropathy, essential HTN, HLD,,   Presents to emergency department with left fifth toe pain since couple of weeks and it turned black 2 days ago.  He tells me that his work boots caused a callus.  He says he pulled callus causing an open wound.  He was evaluated by his PCP on 7/9.  Reports that his left fifth toe turned black 2 days ago associated with some swelling and redness in left lower extremity.  Reports that pain is severe in intensity however denies association with fever, chills, nausea, vomiting, generalized weakness or lethargy.  He also reports that he was bitten by a bug?  A few days ago on left leg and having an open wound.  No history of headache, blurry vision, chest pain, shortness of breath, palpitation, abdominal pain, urinary or bowel changes.  No history of smoking, alcohol, illicit drug use.  ED Course: Upon arrival to ED: Patient afebrile with no leukocytosis.  Blood pressure was noted to be elevated in 190s/90s.  ESR: 81, CRP: 1.1.  COVID-19 negative.  X-ray shows gangrenous changes and fifth digit of left foot.  Patient received fentanyl and clindamycin in ED.  Patient transferred to [facility] for further evaluation and management.   Subjective: 7/27 afebrile overnight A/O x4, negative CP, negative S OB, negative abdominal pain.  Sitting in chair comfortably   Assessment & Plan: Covid vaccination; positive vaccination   Principal Problem:   Dry gangrene ([place]) Active Problems:   Essential hypertension   Diabetes mellitus without complication (HCC)   HLD (hyperlipidemia)   Normocytic anemia   CKD (chronic kidney disease)  Gangrene fifth metatarsal LEFT foot -7/26 s/p-left fifth toe MTP disarticulation -Continue antibiotics per surgery -ambulated around  room -PT/OT per surgery  Ulcer on LEFT leg -Care per wound care consult  Essential HTN -Amlodipine 10 mg daily -Coreg 3.125 mg BID -HCTZ 25 mg daily -Losartan 50 mg daily  HLD -Atorvastatin 80 mg daily -Lipid panel pending  DM type II controlled with complication -6/5 hemoglobin A1c= 6.9 -Moderate SSI  Diabetic neuropathy -7/26 gabapentin '100mg'$  qhs  CKD stage IIIa (baseline Cr ~1.48)  -Hold all nephrotoxic medication Recent Labs  Lab 12/19/19 0229 12/19/19 1118 12/20/19 0131 12/21/19 0123  CREATININE 1.43* 1.20 1.45* 1.95*  -7/27starting to trend up if continues may need to start hydrating  Anemia of chronic disease? -Anemia panel pending -Occult blood pending  Hx CVA -See HLD -ASA 81 mg                  DVT prophylaxis: Lovenox Code Status: Full Family Communication:  Status is: Inpatient    Dispo: The patient is from: Home              Anticipated d/c is to: Home              Anticipated d/c date is: Per surgery              Patient currently unstable      Consultants:  Orthopedic surgery  Procedures/Significant Events:  7/26 s/p-left fifth toe MTP disarticulation -Incision and drainage left forefoot abscess, multiple bursal spaces -Irrigation and debridement, skin and subcutaneous tissue of left lateral leg ulceration   I have personally reviewed and interpreted all radiology studies and my  findings are as above.  VENTILATOR SETTINGS:    Cultures   Antimicrobials: Anti-infectives (From admission, onward)   Start     Ordered Stop   12/20/19 0600  vancomycin (VANCOREADY) IVPB 750 mg/150 mL     Discontinue     12/19/19 1713     12/20/19 0000  piperacillin-tazobactam (ZOSYN) IVPB 3.375 g     Discontinue     12/19/19 1713     12/19/19 1715  vancomycin (VANCOREADY) IVPB 1250 mg/250 mL        12/19/19 1712 12/19/19 2026   12/19/19 1700  piperacillin-tazobactam (ZOSYN) IVPB 3.375 g        12/19/19 1658 12/19/19  1836   12/19/19 1700  vancomycin (VANCOCIN) IVPB 1000 mg/200 mL premix  Status:  Discontinued        12/19/19 1658 12/19/19 1712   12/19/19 0530  clindamycin (CLEOCIN) IVPB 600 mg        12/19/19 0523 12/19/19 0720       Devices    LINES / TUBES:      Continuous Infusions: . piperacillin-tazobactam (ZOSYN)  IV 3.375 g (12/21/19 0857)     Objective: Vitals:   12/21/19 0045 12/21/19 0500 12/21/19 0503 12/21/19 0825  BP: (!) 145/79  (!) 166/88 (!) 163/79  Pulse: 61  58 53  Resp: _0 Temp: 97.7 F (36.5 C)  98.2 F (36.8 C) 97.7 F (36.5 C)  TempSrc: Oral  Oral Oral  SpO2: 100%  100% 100%  Weight:  73.1 kg    Height:        Intake/Output Summary (Last 24 hours) at 12/21/2019 0925 Last data filed at 12/21/2019 0606 Gross per 24 hour  Intake 1060 ml  Output 900 ml  Net 160 ml   Filed Weights   12/19/19 1726 12/20/19 0426 12/21/19 0500  Weight: 71.4 kg 74.1 kg 73.1 kg    Examination:  General: A/O x4, No acute respiratory distress Eyes: negative scleral hemorrhage, negative anisocoria, negative icterus ENT: Negative Runny nose, negative gingival bleeding, Neck:  Negative scars, masses, torticollis, lymphadenopathy, JVD Lungs: Clear to auscultation bilaterally without wheezes or crackles Cardiovascular: Regular rate and rhythm without murmur gallop or rub normal S1 and S2 Abdomen: negative abdominal pain, nondistended, positive soft, bowel sounds, no rebound, no ascites, no appreciable mass Extremities: LEFT foot in a boot with an Ace bandage wrap did not take wrap down.  Toes are warm to touch patient able to wiggle toes. Skin: Negative rashes, lesions, ulcers Psychiatric:  Negative depression, negative anxiety, negative fatigue, negative mania  Central nervous system:  Cranial nerves II through XII intact, tongue/uvula midline, all extremities muscle strength 5/5, sensation intact throughout, negative dysarthria, negative expressive aphasia, negative  receptive aphasia.  .     Data Reviewed: Care during the described time interval was provided by me .  I have reviewed this patient's available data, including medical history, events of note, physical examination, and all test results as part of my evaluation.  CBC: Recent Labs  Lab 12/19/19 0229 12/19/19 1118 12/20/19 0131 12/21/19 0123  WBC 6.5 4.7 4.6 5.8  NEUTROABS 3.9  --   --  4.2  HGB 10.4* 10.4* 10.6* 9.6*  HCT 31.7* 31.1* 33.2* 28.8*  MCV 82.6 82.1 82.0 82.3  PLT 175 175 162 638   Basic Metabolic Panel: Recent Labs  Lab 12/19/19 0229 12/19/19 1118 12/20/19 0131 12/21/19 0123  NA 138  --  137 136  K 4.0  --  4.2 4.4  CL 105  --  105 105  CO2 24  --  22 23  GLUCOSE 150*  --  166* 198*  BUN 20  --  21* 26*  CREATININE 1.43* 1.20 1.45* 1.95*  CALCIUM 8.8*  --  8.8* 8.5*  MG  --   --  2.0 2.2  PHOS  --   --  5.2* 4.7*   GFR: Estimated Creatinine Clearance: 38.1 mL/min (A) (by C-G formula based on SCr of 1.95 mg/dL (H)). Liver Function Tests: Recent Labs  Lab 12/20/19 0131 12/21/19 0123  AST 18 19  ALT 41 32  ALKPHOS 173* 155*  BILITOT 0.5 0.5  PROT 5.9* 5.7*  ALBUMIN 2.2* 2.0*   No results for input(s): LIPASE, AMYLASE in the last 168 hours. No results for input(s): AMMONIA in the last 168 hours. Coagulation Profile: No results for input(s): INR, PROTIME in the last 168 hours. Cardiac Enzymes: No results for input(s): CKTOTAL, CKMB, CKMBINDEX, TROPONINI in the last 168 hours. BNP (last 3 results) No results for input(s): PROBNP in the last 8760 hours. HbA1C: No results for input(s): HGBA1C in the last 72 hours. CBG: Recent Labs  Lab 12/20/19 0802 12/20/19 1207 12/20/19 1718 12/20/19 2127 12/21/19 0742  GLUCAP 142* 151* 189* 148* 179*   Lipid Profile: Recent Labs    12/21/19 0123  CHOL 193  HDL 44  LDLCALC 126*  TRIG 115  CHOLHDL 4.4   Thyroid Function Tests: No results for input(s): TSH, T4TOTAL, FREET4, T3FREE, THYROIDAB in  the last 72 hours. Anemia Panel: Recent Labs    12/21/19 0123  VITAMINB12 473  FOLATE 11.8  FERRITIN 280  TIBC 244*  IRON 50  RETICCTPCT 1.1   Sepsis Labs: Recent Labs  Lab 12/20/19 0131  LATICACIDVEN 1.0    Recent Results (from the past 240 hour(s))  SARS Coronavirus 2 by RT PCR (hospital order, performed in [facility] hospital lab) Nasopharyngeal Nasopharyngeal Swab     Status: None   Collection Time: 12/19/19  4:49 AM   Specimen: Nasopharyngeal Swab  Result Value Ref Range Status   SARS Coronavirus 2 NEGATIVE NEGATIVE Final    Comment: (NOTE) SARS-CoV-2 target nucleic acids are NOT DETECTED.  The SARS-CoV-2 RNA is generally detectable in upper and lower respiratory specimens during the acute phase of infection. The lowest concentration of SARS-CoV-2 viral copies this assay can detect is 250 copies / mL. A negative result does not preclude SARS-CoV-2 infection and should not be used as the sole basis for treatment or other patient management decisions.  A negative result may occur with improper specimen collection / handling, submission of specimen other than nasopharyngeal swab, presence of viral mutation(s) within the areas targeted by this assay, and inadequate number of viral copies (<250 copies / mL). A negative result must be combined with clinical observations, patient history, and epidemiological information.  Fact Sheet for Patients:   StrictlyIdeas.no  Fact Sheet for Healthcare Providers: BankingDealers.co.za  This test is not yet approved or  cleared by the [place] FDA and has been authorized for detection and/or diagnosis of SARS-CoV-2 by FDA under an Emergency Use Authorization (EUA).  This EUA will remain in effect (meaning this test can be used) for the duration of the COVID-19 declaration under Section 564(b)(1) of the Act, 21 U.S.C. section 360bbb-3(b)(1), unless the authorization is terminated  or revoked sooner.  Performed at [facility], [address] [place], [place] [address]   Surgical pcr screen  Status: None   Collection Time: 12/20/19  1:56 AM   Specimen: Nasal Mucosa; Nasal Swab  Result Value Ref Range Status   MRSA, PCR NEGATIVE NEGATIVE Final   Staphylococcus aureus NEGATIVE NEGATIVE Final    Comment: (NOTE) The Xpert SA Assay (FDA approved for NASAL specimens in patients 46 years of age and older), is one component of a comprehensive surveillance program. It is not intended to diagnose infection nor to guide or monitor treatment. Performed at [facility], [place] [address], [place], [place] [address]   Aerobic/Anaerobic Culture (surgical/deep wound)     Status: None (Preliminary result)   Collection Time: 12/20/19  7:38 AM   Specimen: PATH Digit amputation; Tissue  Result Value Ref Range Status   Specimen Description TISSUE LEFT TOE  Final   Special Requests FIFTH TOE  Final   Gram Stain   Final    MODERATE WBC PRESENT,BOTH PMN AND MONONUCLEAR MODERATE SQUAMOUS EPITHELIAL CELLS PRESENT FEW GRAM POSITIVE RODS RARE GRAM POSITIVE COCCI IN PAIRS Performed at [facility], [place] [address] [place], [place] [address]    Culture PENDING  Incomplete   Report Status PENDING  Incomplete         Radiology Studies: No results found.      Scheduled Meds: . amLODipine  10 mg Oral Daily  . aspirin EC  81 mg Oral Daily  . atorvastatin  80 mg Oral Daily  . carvedilol  3.125 mg Oral BID WC  . enoxaparin (LOVENOX) injection  40 mg Subcutaneous Q24H  . gabapentin  100 mg Oral QHS  . hydrochlorothiazide  25 mg Oral Daily  . insulin aspart  0-15 Units Subcutaneous TID WC  . insulin aspart  0-5 Units Subcutaneous QHS  . losartan  50 mg Oral Daily   Continuous Infusions: . piperacillin-tazobactam (ZOSYN)  IV 3.375 g (12/21/19 0857)     LOS: 2 days    Time spent:40 min    Joye Wesenberg, Geraldo Docker, MD [facility] Pager  [phone]  If 7PM-7AM, please contact night-coverage [email or web address] Password [facility] 12/21/2019, 9:25 AM

## 2019-12-21 NOTE — Plan of Care (Signed)

## 2019-12-21 NOTE — Progress Notes (Signed)
Pharmacy Antibiotic Note  Douglas Briggs is a 59 y.o. male admitted on 12/19/2019 with dry gangrene/cellulitis.   S/p toe disarticulation I+D of leg ulceration  Scr worsening this morning on Vancomycin and Zosyn  Plan: Stop Vancomycin and Zosyn Ceftriaxone 2 grams iv Q 24 Zyvox 600 mg po Q 12  Monitor cultures, clinical status, renal fx, vancomycin levels as needed Narrow abx as able and f/u duration     Height: 5\' 7"  (170.2 cm) Weight: 73.1 kg (161 lb 2.5 oz) (bed scale) IBW/kg (Calculated) : 66.1  Temp (24hrs), Avg:97.9 F (36.6 C), Min:97.6 F (36.4 C), Max:98.3 F (36.8 C)  Recent Labs  Lab 12/19/19 0229 12/19/19 1118 12/20/19 0131 12/21/19 0123  WBC 6.5 4.7 4.6 5.8  CREATININE 1.43* 1.20 1.45* 1.95*  LATICACIDVEN  --   --  1.0  --     Estimated Creatinine Clearance: 38.1 mL/min (A) (by C-G formula based on SCr of 1.95 mg/dL (H)).    No Known Allergies   Thank you for allowing pharmacy to be a part of this patients care. Anette Guarneri, PharmD  Please check AMION for all [place] phone numbers After 10:00 PM, call [place] [phone]

## 2019-12-21 NOTE — Plan of Care (Signed)
  Problem: Clinical Measurements: Goal: Ability to avoid or minimize complications of infection will improve Outcome: Progressing   

## 2019-12-22 DIAGNOSIS — I96 Gangrene, not elsewhere classified: Secondary | ICD-10-CM | POA: Diagnosis not present

## 2019-12-22 DIAGNOSIS — E119 Type 2 diabetes mellitus without complications: Secondary | ICD-10-CM

## 2019-12-22 DIAGNOSIS — I10 Essential (primary) hypertension: Secondary | ICD-10-CM | POA: Diagnosis not present

## 2019-12-22 DIAGNOSIS — E7800 Pure hypercholesterolemia, unspecified: Secondary | ICD-10-CM | POA: Diagnosis not present

## 2019-12-22 LAB — COMPREHENSIVE METABOLIC PANEL
ALT: 24 U/L (ref 0–44)
AST: 13 U/L — ABNORMAL LOW (ref 15–41)
Albumin: 2.1 g/dL — ABNORMAL LOW (ref 3.5–5.0)
Alkaline Phosphatase: 154 U/L — ABNORMAL HIGH (ref 38–126)
Anion gap: 6 (ref 5–15)
BUN: 20 mg/dL (ref 6–20)
CO2: 25 mmol/L (ref 22–32)
Calcium: 8.7 mg/dL — ABNORMAL LOW (ref 8.9–10.3)
Chloride: 108 mmol/L (ref 98–111)
Creatinine, Ser: 1.73 mg/dL — ABNORMAL HIGH (ref 0.61–1.24)
GFR calc Af Amer: 49 mL/min — ABNORMAL LOW (ref >60)
GFR calc non Af Amer: 42 mL/min — ABNORMAL LOW (ref >60)
Glucose, Bld: 164 mg/dL — ABNORMAL HIGH (ref 70–99)
Potassium: 4 mmol/L (ref 3.5–5.1)
Sodium: 139 mmol/L (ref 135–145)
Total Bilirubin: 0.6 mg/dL (ref 0.3–1.2)
Total Protein: 5.9 g/dL — ABNORMAL LOW (ref 6.5–8.1)

## 2019-12-22 LAB — CBC WITH DIFFERENTIAL/PLATELET
Abs Immature Granulocytes: 0.01 10*3/uL (ref 0.00–0.07)
Basophils Absolute: 0 10*3/uL (ref 0.0–0.1)
Basophils Relative: 1 %
Eosinophils Absolute: 0.2 10*3/uL (ref 0.0–0.5)
Eosinophils Relative: 3 %
HCT: 30.9 % — ABNORMAL LOW (ref 39.0–52.0)
Hemoglobin: 10.2 g/dL — ABNORMAL LOW (ref 13.0–17.0)
Immature Granulocytes: 0 %
Lymphocytes Relative: 29 %
Lymphs Abs: 1.3 10*3/uL (ref 0.7–4.0)
MCH: 27.1 pg (ref 26.0–34.0)
MCHC: 33 g/dL (ref 30.0–36.0)
MCV: 82 fL (ref 80.0–100.0)
Monocytes Absolute: 0.5 10*3/uL (ref 0.1–1.0)
Monocytes Relative: 10 %
Neutro Abs: 2.6 10*3/uL (ref 1.7–7.7)
Neutrophils Relative %: 57 %
Platelets: 173 10*3/uL (ref 150–400)
RBC: 3.77 MIL/uL — ABNORMAL LOW (ref 4.22–5.81)
RDW: 12.7 % (ref 11.5–15.5)
WBC: 4.6 10*3/uL (ref 4.0–10.5)
nRBC: 0 % (ref 0.0–0.2)

## 2019-12-22 LAB — GLUCOSE, CAPILLARY
Glucose-Capillary: 123 mg/dL — ABNORMAL HIGH (ref 70–99)
Glucose-Capillary: 208 mg/dL — ABNORMAL HIGH (ref 70–99)
Glucose-Capillary: 119 mg/dL — ABNORMAL HIGH (ref 70–99)

## 2019-12-22 LAB — MAGNESIUM: Magnesium: 2.2 mg/dL (ref 1.7–2.4)

## 2019-12-22 LAB — PHOSPHORUS: Phosphorus: 3.8 mg/dL (ref 2.5–4.6)

## 2019-12-22 MED ORDER — TRAMADOL HCL 50 MG PO TABS: 50.0000 mg | ORAL_TABLET | Freq: Four times a day (QID) | ORAL | 0 refills | Status: DC | PRN

## 2019-12-22 MED ORDER — SITAGLIPTIN PHOSPHATE 50 MG PO TABS
50.0000 mg | ORAL_TABLET | Freq: Every day | ORAL | 0 refills | Status: DC
Start: 2019-12-22 — End: 2019-12-22

## 2019-12-22 MED ORDER — GABAPENTIN 100 MG PO CAPS: 100.0000 mg | ORAL_CAPSULE | Freq: Every day | ORAL | 0 refills | Status: DC

## 2019-12-22 MED ORDER — GLIMEPIRIDE 1 MG PO TABS: 1.0000 mg | ORAL_TABLET | Freq: Every day | ORAL | 0 refills | Status: DC

## 2019-12-22 MED FILL — traMADol HCL 50 MG TABS: 50 | 3 days supply | Qty: 15 | Fill #0

## 2019-12-22 MED FILL — GLIMEPIRIDE 2 MG TABLET: 2 | 30 days supply | Qty: 15 | Fill #0

## 2019-12-22 MED FILL — GABAPENTIN 100 MG CAPSULE: 100 | 30 days supply | Qty: 30 | Fill #0

## 2019-12-22 NOTE — Evaluation (Signed)
Physical Therapy Evaluation Patient Details Name: Douglas Briggs MRN: [id number] DOB: [birth year] Today's Date: 12/22/2019   History of Present Illness  59 y.o.s/p L 5th digit amputation surgery due to dry gangree. He presents to emergency department with left fifth toe pain since couple of weeksandit turned black 2 days ago. PMHx CVA, DM type II controlled with complication, DM neuropathy, essential HTN, HLD.   Clinical Impression  Pt was evaluated for the above mentioned diagnosis and the impairments listed below. Pt was impulsive, getting up prior to being asked. Pt was educated on WBAT on heel precautions and was able to state them; however, pt did not demonstrate precautions during gait and required many verbal cues. Pt required supervision with all functioning tasks to ensure maintenance of precautions. He would continue to benefit from skilled therapy in order to ensure his safety with functional tasks. Will continue to follow acutely.     Follow Up Recommendations No PT follow up    Equipment Recommendations  Rolling walker with 5" wheels    Recommendations for Other Services       Precautions / Restrictions Precautions Precautions: None Required Braces or Orthoses: Other Brace (post-op shoe) Restrictions Weight Bearing Restrictions: Yes LLE Weight Bearing: Weight bearing as tolerated (on heel ) Other Position/Activity Restrictions: heel only - WBAT      Mobility  Bed Mobility Overal bed mobility: Independent     General bed mobility comments: pt was in bed and transitioned to EOB independently with supine to sit  Transfers Overall transfer level: Needs assistance Equipment used: Rolling walker (2 wheeled) Transfers: Sit to/from Stand Sit to Stand: Supervision    General transfer comment: pt impulsively stood up from EOB to start walking prior to therapist being ready. Pt was able to stand with supervision and a RW for safety. Pt required cues for hand placement  during sit<>stand transfer with RW   Ambulation/Gait Ambulation/Gait assistance: Supervision Gait Distance (Feet): 100 Feet Assistive device: Rolling walker (2 wheeled) Gait Pattern/deviations: Step-through pattern;Decreased step length - left;Decreased step length - right Gait velocity: WFL   General Gait Details: pt was able to state precautions but unable to demonstrate heel weightbearing. Pt was educated on precautions; however, require many verbal cues to maintain precautions without success. Pt required education and verbal cues on RW sequencing and proximity to device.   Stairs            Wheelchair Mobility    Modified Rankin (Stroke Patients Only)       Balance Overall balance assessment: Needs assistance Sitting-balance support: No upper extremity supported;Feet supported Sitting balance-Leahy Scale: Good Sitting balance - Comments: pt was able to maintain sitting balance at EOB while gait belt applied   Standing balance support: During functional activity;Bilateral upper extremity supported;No upper extremity supported Standing balance-Leahy Scale: Fair Standing balance comment: pt able to maintain static standing balance without BUE support and without LOB while adjusting gaitbelt                             Pertinent Vitals/Pain Pain Assessment: No/denies pain    Home Living Family/patient expects to be discharged to:: Private residence Living Arrangements: Parent Available Help at Discharge: Family Type of Home: House Home Access: Level entry     Home Layout: One level Home Equipment: None      Prior Function Level of Independence: Independent         Comments: 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     Hand Dominance        Extremity/Trunk Assessment   Upper Extremity Assessment Upper Extremity Assessment: Overall WFL for tasks assessed    Lower Extremity Assessment Lower Extremity Assessment: Overall WFL for tasks assessed     Cervical / Trunk Assessment Cervical / Trunk Assessment: Normal  Communication   Communication: No difficulties  Cognition Arousal/Alertness: Awake/alert Behavior During Therapy: WFL for tasks assessed/performed Overall Cognitive Status: Within Functional Limits for tasks assessed                                        General Comments      Exercises     Assessment/Plan    PT Assessment Patient needs continued PT services  PT Problem List Decreased strength;Decreased balance;Decreased mobility;Decreased coordination;Decreased safety awareness;Decreased knowledge of precautions;Decreased knowledge of use of DME       PT Treatment Interventions DME instruction;Gait training;Functional mobility training;Therapeutic activities;Therapeutic exercise;Balance training;Neuromuscular re-education;Patient/family education    PT Goals (Current goals can be found in the Care Plan section)  Acute Rehab PT Goals Patient Stated Goal: get physically stronger  PT Goal Formulation: With patient Time For Goal Achievement: 01/05/20 Potential to Achieve Goals: Good    Frequency Min 3X/week   Barriers to discharge        Co-evaluation               AM-PAC PT "6 Clicks" Mobility  Outcome Measure Help needed turning from your back to your side while in a flat bed without using bedrails?: None Help needed moving from lying on your back to sitting on the side of a flat bed without using bedrails?: None Help needed moving to and from a bed to a chair (including a wheelchair)?: None Help needed standing up from a chair using your arms (e.g., wheelchair or bedside chair)?: None Help needed to walk in hospital room?: None Help needed climbing 3-5 steps with a railing? : A Little 6 Click Score: 23    End of Session Equipment Utilized During Treatment: Gait belt Activity Tolerance: Patient tolerated treatment well Patient left: in chair;with call Rylynn Schoneman/phone within  reach Nurse Communication: Mobility status PT Visit Diagnosis: Unsteadiness on feet (R26.81);Difficulty in walking, not elsewhere classified (R26.2)    Time: 4818-5909 PT Time Calculation (min) (ACUTE ONLY): 17 min   Charges:   PT Evaluation $PT Eval Low Complexity: 1 Low        Gloriann Loan, SPT  Acute Rehabilitation Services  Office: [phone]  12/22/2019, 2:35 PM

## 2019-12-22 NOTE — Progress Notes (Signed)
Discharge instructions and follow up appts reviewed with patient verbalized understanding.   Jaidalyn Schillo, Tivis Ringer, RN

## 2019-12-22 NOTE — Discharge Summary (Addendum)
Physician Discharge Summary  Douglas Briggs OLM:[id number] DOB: [birth year] DOA: 12/19/2019  PCP: Patient, No Pcp Per  Admit date: 12/19/2019 Discharge date: 12/22/2019  Admitted From: Home Disposition:  Home  Recommendations for Outpatient Follow-up:  1. Follow up with PCP in 1-2 weeks 2. Follow up with Orthopedic Surgery as scheduled 3. Recommend repeat bmet in 1 week, focus on renal function  NCCSR reviewed. No controlled substances listed. Limited quantity of narcotic will be prescribed for post-op pain  Equipment/Devices:Rolling walker    Discharge Condition:Stable CODE STATUS:Full Diet recommendation: Diabetic   Brief/Interim Summary: 59 y.o.BM PMHx CVA, DM type II controlled with complication, DM neuropathy, essential HTN, HLD,,   Presents to emergency department with left fifth toe pain since couple of weeksandit turned black 2 days ago.  He tells me that his work boots caused a callus. He says he pulled callus causing an open wound. He was evaluated by his PCP on 7/9. Reports that his left fifth toe turned black 2 days agoassociated with some swelling and redness in left lower extremity. Reports that pain is severe in intensity however denies association with fever, chills, nausea, vomiting, generalized weakness or lethargy.  He also reports that he was bitten by a bug? A few days ago on left leg and having an open wound.  No history of headache, blurry vision, chest pain, shortness of breath, palpitation, abdominal pain, urinary or bowel changes.  No history of smoking, alcohol, illicit drug use.  ED Course:Upon arrival to ED: Patient afebrile with no leukocytosis. Blood pressure was noted to be elevated in 190s/90s. ESR: 81, CRP: 1.1. COVID-19 negative. X-ray shows gangrenous changes and fifth digit of left foot. Patient received fentanyl and clindamycin in ED. Patient transferred to [facility] for further evaluation and management.  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 -Completed course of abx -ambulated around room -PT with no PT needs identified  Ulcer on LEFT leg -Care per wound care consult  Essential HTN -Amlodipine 10 mg daily -Coreg 3.125 mg BID -HCTZ 25 mg daily -Losartan 50 mg daily  HLD -Atorvastatin 80 mg while in hospital  DM type II controlled with complication -6/5 hemoglobin A1c= 6.9 -Moderate SSI while in hospital -Will prescribe glimepiride in addition to home metformin  Diabetic neuropathy -7/26 gabapentin 134m qhs  CKD stage IIIa (baseline Cr ~1.48)  -Held all nephrotoxic medication with improvement in renal function by time of d/c -Pt continuing to void well  Anemia of chronic disease -Iron level within normal limits  Hx CVA -See HLD -ASA 81 mg  Discharge Instructions   Allergies as of 12/22/2019   No Known Allergies     Medication List    STOP taking these medications   atorvastatin 80 MG tablet Commonly known as: LIPITOR   clopidogrel 75 MG tablet Commonly known as: PLAVIX   mupirocin cream 2 % Commonly known as: Bactroban     TAKE these medications   amLODipine 10 MG tablet Commonly known as: NORVASC Take 1 tablet (10 mg total) by mouth daily. What changed:   how much to take  when to take this   aspirin EC 81 MG tablet Take 1 tablet (81 mg total) by mouth daily.   carvedilol 3.125 MG tablet Commonly  known as: COREG Take 1 tablet (3.125 mg total) by mouth 2 (two) times daily with a meal.   gabapentin 100 MG capsule Commonly known as: NEURONTIN Take 1 capsule (100 mg total) by mouth at bedtime.   glimepiride 1 MG tablet Commonly known as: AMARYL Take 1 tablet (1 mg  total) by mouth daily with breakfast.   hydrochlorothiazide 25 MG tablet Commonly known as: HYDRODIURIL Take 1 tablet (25 mg total) by mouth daily.   losartan 25 MG tablet Commonly known as: COZAAR Take 2 tablets (50 mg total) by mouth daily. What changed:   how much to take  when to take this   metFORMIN 500 MG tablet Commonly known as: GLUCOPHAGE Take 500 mg by mouth at bedtime.   multivitamin with minerals Tabs tablet Take 1 tablet by mouth daily with breakfast.   mupirocin ointment 2 % Commonly known as: BACTROBAN Apply 1 application topically daily. Apply topically to toe   traMADol 50 MG tablet Commonly known as: ULTRAM Take 1 tablet (50 mg total) by mouth every 6 (six) hours as needed for moderate pain.   VITAMIN D PO Take 1 tablet by mouth daily with breakfast.            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 Schedule an appointment as soon as possible for a visit in 2 weeks.   Specialty: Orthopedic Surgery Contact information: [place] [address] [phone]        Follow up with your PCP in 1-2 weeks Follow up.              No Known Allergies  Consultations:  Orthopedic Surgery  Procedures/Studies: DG Foot Complete Left  Result Date: 12/19/2019 CLINICAL DATA:  Left fifth digit pain EXAM: LEFT FOOT - COMPLETE 3+ VIEW COMPARISON:  None. FINDINGS: Frontal, oblique, and lateral views of the left foot are obtained. There are no acute displaced fractures. There is decreased soft tissue of the fifth digit, concerning for necrosis. Minimal subcutaneous gas is seen within the lateral  aspect of the fifth digit. There is subtle cortical erosion lateral margin distal aspect fifth proximal phalanx. IMPRESSION: 1. Findings concerning for gangrene of the fifth digit left foot, with subcutaneous gas and underlying fifth proximal phalangeal cortical destruction. Superimposed infection could also give this appearance. Electronically Signed   By: Randa Ngo M.D.   On: 12/19/2019 02:34    Subjective: Eager to go home  Discharge Exam: Vitals:   12/22/19 0534 12/22/19 1435  BP: 123/72 124/85  Pulse: 80 85  Resp: 14 17  Temp: 97.7 F (36.5 C) 97.8 F (36.6 C)  SpO2: 99% 100%   Vitals:   12/21/19 2035 12/22/19 0500 12/22/19 0534 12/22/19 1435  BP: (!) 162/122  123/72 124/85  Pulse: 65  80 85  Resp: 18  14 17   Temp: 97.8 F (36.6 C)  97.7 F (36.5 C) 97.8 F (36.6 C)  TempSrc: Oral  Oral Oral  SpO2: 100%  99% 100%  Weight:  73.5 kg    Height:        General: Pt is alert, awake, not in acute distress Cardiovascular: RRR, S1/S2 +, no rubs, no gallops Respiratory: CTA bilaterally, no wheezing, no rhonchi Abdominal: Soft, NT, ND, bowel sounds + Extremities: no edema, no cyanosis   The results of significant diagnostics from this hospitalization (including imaging, microbiology, ancillary and laboratory) are listed below for reference.     Microbiology: Recent Results (from the past 240 hour(s))  SARS Coronavirus 2 by RT PCR (hospital order, performed in [facility] hospital lab) Nasopharyngeal Nasopharyngeal Swab     Status: None   Collection Time: 12/19/19  4:49 AM   Specimen: Nasopharyngeal Swab  Result Value Ref Range Status   SARS Coronavirus 2 NEGATIVE NEGATIVE Final    Comment: (NOTE) SARS-CoV-2 target nucleic acids are NOT DETECTED.  The SARS-CoV-2 RNA is generally detectable in upper and lower respiratory specimens during the acute phase of infection. The lowest concentration of SARS-CoV-2 viral copies this assay can detect is 250 copies / mL. A  negative result does not preclude SARS-CoV-2 infection and should not be used as the sole basis for treatment or other patient management decisions.  A negative result may occur with improper specimen collection / handling, submission of specimen other than nasopharyngeal swab, presence of viral mutation(s) within the areas targeted by this assay, and inadequate number of viral copies (<250 copies / mL). A negative result must be combined with clinical observations, patient history, and epidemiological information.  Fact Sheet for Patients:   StrictlyIdeas.no  Fact Sheet for Healthcare Providers: BankingDealers.co.za  This test is not yet approved or  cleared by the [place] FDA and has been authorized for detection and/or diagnosis of SARS-CoV-2 by FDA under an Emergency Use Authorization (EUA).  This EUA will remain in effect (meaning this test can be used) for the duration of the COVID-19 declaration under Section 564(b)(1) of the Act, 21 U.S.C. section 360bbb-3(b)(1), unless the authorization is terminated or revoked sooner.  Performed at [facility], [address] [place], [place] [address]   Culture, blood (routine x 2)     Status: None (Preliminary result)   Collection Time: 12/20/19  1:31 AM   Specimen: BLOOD  Result Value Ref Range Status   Specimen Description BLOOD RIGHT ARM  Final   Special Requests   Final    BOTTLES DRAWN AEROBIC AND ANAEROBIC Blood Culture adequate volume   Culture   Final    NO GROWTH 1 DAY Performed at [facility], [place] [address], [place], [place] [address]    Report Status PENDING  Incomplete  Culture, blood (routine x 2)     Status: None (Preliminary result)   Collection Time: 12/20/19  1:36 AM   Specimen: BLOOD RIGHT HAND  Result Value Ref Range Status   Specimen Description BLOOD RIGHT HAND  Final   Special Requests   Final    BOTTLES DRAWN AEROBIC AND ANAEROBIC  Blood Culture adequate volume   Culture   Final    NO GROWTH 1 DAY Performed at [facility], [place] [address] [place], [place] [address]    Report Status PENDING  Incomplete  Surgical pcr screen     Status: None   Collection Time: 12/20/19  1:56 AM   Specimen: Nasal Mucosa; Nasal Swab  Result Value Ref Range Status   MRSA, PCR NEGATIVE NEGATIVE Final   Staphylococcus aureus NEGATIVE  NEGATIVE Final    Comment: (NOTE) The Xpert SA Assay (FDA approved for NASAL specimens in patients 73 years of age and older), is one component of a comprehensive surveillance program. It is not intended to diagnose infection nor to guide or monitor treatment. Performed at [facility], [place] [address] [place], [place] [address]   Aerobic/Anaerobic Culture (surgical/deep wound)     Status: None (Preliminary result)   Collection Time: 12/20/19  7:38 AM   Specimen: PATH Digit amputation; Tissue  Result Value Ref Range Status   Specimen Description TISSUE LEFT TOE  Final   Special Requests FIFTH TOE  Final   Gram Stain   Final    MODERATE WBC PRESENT,BOTH PMN AND MONONUCLEAR MODERATE SQUAMOUS EPITHELIAL CELLS PRESENT FEW GRAM POSITIVE RODS RARE GRAM POSITIVE COCCI IN PAIRS    Culture   Final    FEW PSEUDOMONAS AERUGINOSA FEW KLEBSIELLA OXYTOCA MODERATE DIPHTHEROIDS(CORYNEBACTERIUM SPECIES) SUSCEPTIBILITIES TO FOLLOW Performed at [facility], [place] [address], [place],  [address]    Report Status PENDING  Incomplete     Labs: BNP (last 3 results) No results for input(s): BNP in the last 8760 hours. Basic Metabolic Panel: Recent Labs  Lab 12/19/19 0229 12/19/19 1118 12/20/19 0131 12/21/19 0123 12/22/19 0424  NA 138  --  137 136 139  K 4.0  --  4.2 4.4 4.0  CL 105  --  105 105 108  CO2 24  --  22 23 25   GLUCOSE 150*  --  166* 198* 164*  BUN 20  --  21* 26* 20  CREATININE 1.43* 1.20 1.45* 1.95* 1.73*  CALCIUM 8.8*  --  8.8* 8.5* 8.7*  MG  --   --  2.0 2.2 2.2   PHOS  --   --  5.2* 4.7* 3.8   Liver Function Tests: Recent Labs  Lab 12/20/19 0131 12/21/19 0123 12/22/19 0424  AST 18 19 13*  ALT 41 32 24  ALKPHOS 173* 155* 154*  BILITOT 0.5 0.5 0.6  PROT 5.9* 5.7* 5.9*  ALBUMIN 2.2* 2.0* 2.1*   No results for input(s): LIPASE, AMYLASE in the last 168 hours. No results for input(s): AMMONIA in the last 168 hours. CBC: Recent Labs  Lab 12/19/19 0229 12/19/19 1118 12/20/19 0131 12/21/19 0123 12/22/19 0424  WBC 6.5 4.7 4.6 5.8 4.6  NEUTROABS 3.9  --   --  4.2 2.6  HGB 10.4* 10.4* 10.6* 9.6* 10.2*  HCT 31.7* 31.1* 33.2* 28.8* 30.9*  MCV 82.6 82.1 82.0 82.3 82.0  PLT 175 175 162 170 173   Cardiac Enzymes: No results for input(s): CKTOTAL, CKMB, CKMBINDEX, TROPONINI in the last 168 hours. BNP: Invalid input(s): POCBNP CBG: Recent Labs  Lab 12/21/19 1231 12/21/19 1740 12/21/19 2036 12/22/19 0736 12/22/19 1152  GLUCAP 152* 107* 198* 123* 208*   D-Dimer No results for input(s): DDIMER in the last 72 hours. Hgb A1c No results for input(s): HGBA1C in the last 72 hours. Lipid Profile Recent Labs    12/21/19 0123  CHOL 193  HDL 44  LDLCALC 126*  TRIG 115  CHOLHDL 4.4   Thyroid function studies No results for input(s): TSH, T4TOTAL, T3FREE, THYROIDAB in the last 72 hours.  Invalid input(s): FREET3 Anemia work up Recent Labs    12/21/19 0123  VITAMINB12 473  FOLATE 11.8  FERRITIN 280  TIBC 244*  IRON 50  RETICCTPCT 1.1   Urinalysis    Component Value Date/Time   COLORURINE YELLOW 11/07/2019 0546  APPEARANCEUR CLEAR 11/07/2019 0546   LABSPEC 1.020 11/07/2019 0546   PHURINE 6.5 11/07/2019 0546   GLUCOSEU 100 (A) 11/07/2019 0546   HGBUR TRACE (A) 11/07/2019 0546   BILIRUBINUR NEGATIVE 11/07/2019 0546   KETONESUR NEGATIVE 11/07/2019 0546   PROTEINUR >300 (A) 11/07/2019 0546   UROBILINOGEN 0.2 05/28/2009 1405   NITRITE NEGATIVE 11/07/2019 0546   LEUKOCYTESUR NEGATIVE 11/07/2019 0546   Sepsis Labs Invalid  input(s): PROCALCITONIN,  WBC,  LACTICIDVEN Microbiology Recent Results (from the past 240 hour(s))  SARS Coronavirus 2 by RT PCR (hospital order, performed in [place] hospital lab) Nasopharyngeal Nasopharyngeal Swab     Status: None   Collection Time: 12/19/19  4:49 AM   Specimen: Nasopharyngeal Swab  Result Value Ref Range Status   SARS Coronavirus 2 NEGATIVE NEGATIVE Final    Comment: (NOTE) SARS-CoV-2 target nucleic acids are NOT DETECTED.  The SARS-CoV-2 RNA is generally detectable in upper and lower respiratory specimens during the acute phase of infection. The lowest concentration of SARS-CoV-2 viral copies this assay can detect is 250 copies / mL. A negative result does not preclude SARS-CoV-2 infection and should not be used as the sole basis for treatment or other patient management decisions.  A negative result may occur with improper specimen collection / handling, submission of specimen other than nasopharyngeal swab, presence of viral mutation(s) within the areas targeted by this assay, and inadequate number of viral copies (<250 copies / mL). A negative result must be combined with clinical observations, patient history, and epidemiological information.  Fact Sheet for Patients:   StrictlyIdeas.no  Fact Sheet for Healthcare Providers: BankingDealers.co.za  This test is not yet approved or  cleared by the [place] FDA and has been authorized for detection and/or diagnosis of SARS-CoV-2 by FDA under an Emergency Use Authorization (EUA).  This EUA will remain in effect (meaning this test can be used) for the duration of the COVID-19 declaration under Section 564(b)(1) of the Act, 21 U.S.C. section 360bbb-3(b)(1), unless the authorization is terminated or revoked sooner.  Performed at [facility], [address] [place], [place] [address]   Culture, blood (routine x 2)     Status: None (Preliminary  result)   Collection Time: 12/20/19  1:31 AM   Specimen: BLOOD  Result Value Ref Range Status   Specimen Description BLOOD RIGHT ARM  Final   Special Requests   Final    BOTTLES DRAWN AEROBIC AND ANAEROBIC Blood Culture adequate volume   Culture   Final    NO GROWTH 1 DAY Performed at [facility], [place] [address] [place], [place] [address]    Report Status PENDING  Incomplete  Culture, blood (routine x 2)     Status: None (Preliminary result)   Collection Time: 12/20/19  1:36 AM   Specimen: BLOOD RIGHT HAND  Result Value Ref Range Status   Specimen Description BLOOD RIGHT HAND  Final   Special Requests   Final    BOTTLES DRAWN AEROBIC AND ANAEROBIC Blood Culture adequate volume   Culture   Final    NO GROWTH 1 DAY Performed at [facility], [place] [address], [place], [place] [address]    Report Status PENDING  Incomplete  Surgical pcr screen     Status: None   Collection Time: 12/20/19  1:56 AM   Specimen: Nasal Mucosa; Nasal Swab  Result Value Ref Range Status   MRSA, PCR NEGATIVE NEGATIVE Final   Staphylococcus aureus NEGATIVE NEGATIVE  Final    Comment: (NOTE) The Xpert SA Assay (FDA approved for NASAL specimens in patients 29 years of age and older), is one component of a comprehensive surveillance program. It is not intended to diagnose infection nor to guide or monitor treatment. Performed at [facility], [place] [address] [place], [place] [address]   Aerobic/Anaerobic Culture (surgical/deep wound)     Status: None (Preliminary result)   Collection Time: 12/20/19  7:38 AM   Specimen: PATH Digit amputation; Tissue  Result Value Ref Range Status   Specimen Description TISSUE LEFT TOE  Final   Special Requests FIFTH TOE  Final   Gram Stain   Final    MODERATE WBC PRESENT,BOTH PMN AND MONONUCLEAR MODERATE SQUAMOUS EPITHELIAL CELLS PRESENT FEW GRAM POSITIVE RODS RARE GRAM POSITIVE COCCI IN PAIRS    Culture   Final    FEW PSEUDOMONAS AERUGINOSA FEW  KLEBSIELLA OXYTOCA MODERATE DIPHTHEROIDS(CORYNEBACTERIUM SPECIES) SUSCEPTIBILITIES TO FOLLOW Performed at [facility], [place] [address], [place], [place] [address]    Report Status PENDING  Incomplete   Time spent: 30 min  SIGNED:   Marylu Lund, MD  [facility] 12/22/2019, 4:51 PM  If 7PM-7AM, please contact night-coverage

## 2019-12-22 NOTE — Care Management (Signed)
Ordered walker with [place], will be delivered to room prior to DC. [place] working on medications. CHW information provided   Magdalen Spatz RN

## 2019-12-24 ENCOUNTER — Other Ambulatory Visit: Payer: Self-pay

## 2019-12-24 ENCOUNTER — Ambulatory Visit (INDEPENDENT_AMBULATORY_CARE_PROVIDER_SITE_OTHER): Payer: No Typology Code available for payment source | Admitting: Cardiology

## 2019-12-24 ENCOUNTER — Encounter: Payer: Self-pay | Admitting: Cardiology

## 2019-12-24 VITALS — BP 150/88 | HR 90 | Ht 68.0 in | Wt 161.0 lb

## 2019-12-24 DIAGNOSIS — I10 Essential (primary) hypertension: Secondary | ICD-10-CM | POA: Diagnosis not present

## 2019-12-24 DIAGNOSIS — E119 Type 2 diabetes mellitus without complications: Secondary | ICD-10-CM

## 2019-12-24 DIAGNOSIS — I7090 Unspecified atherosclerosis: Secondary | ICD-10-CM | POA: Diagnosis not present

## 2019-12-24 DIAGNOSIS — I639 Cerebral infarction, unspecified: Secondary | ICD-10-CM | POA: Diagnosis not present

## 2019-12-24 DIAGNOSIS — E781 Pure hyperglyceridemia: Secondary | ICD-10-CM

## 2019-12-24 NOTE — Progress Notes (Signed)
Cardiology Office Note:    Date:  12/24/2019   ID:  Douglas Briggs, DOB 1960/11/03, MRN [id number]  PCP:  Patient, No Pcp Per  Cardiologist:  Jenean Lindau, MD   Referring MD: Rubie Maid, MD    ASSESSMENT:    1. Atherosclerotic vascular disease   2. Essential hypertension   3. Pure hypertriglyceridemia   4. Cerebrovascular accident (CVA), unspecified mechanism ([place])   5. Diabetes mellitus without complication ([place])    PLAN:    In order of problems listed above:  1. Atherosclerotic vascular disease: Secondary prevention stressed with the patient.  Importance of compliance with diet medication stressed any vocalized understanding.  He has undergone 2 amputation and is awaiting healing.  He is followed by his surgeon. 2. Essential hypertension: Stable at this time.  He has an element of whitecoat hypertension and also pain he is driving his blood pressure up.  I told him to avoid NSAIDs for pain in view of diabetes and renal dysfunction. 3. Mixed dyslipidemia: Lipids followed by primary care physician.  He has stopped his statins as instructed by the hospitalist.  I will see him in follow-up appointment with me in the month and and start him at that time.  Statin therapy again. 4. Cerebrovascular disease: Secondary prevention stressed 5. Follow-up appointment in a month or earlier if he has any concerns.  For all his amputation and related issues he will see his surgeon and will be seen by primary care for diabetes.    Medication Adjustments/Labs and Tests Ordered: Current medicines are reviewed at length with the patient today.  Concerns regarding medicines are outlined above.  No orders of the defined types were placed in this encounter.  No orders of the defined types were placed in this encounter.    No chief complaint on file.    History of Present Illness:    Douglas Briggs is a 59 y.o. male.  Patient has past medical history of essential hypertension diabetes  mellitus dyslipidemia cerebrovascular disease and stroke.  He has undergone amputation of the toe.  Subsequently is done fine.  He was told to hold his statin at the hospital for unclear reasons.  He denies any chest pain orthopnea or PND.  At the time of my evaluation, the patient is alert awake oriented and in no distress.  He mentions to me that his blood pressures are better at home and because of his back pain he has elevated blood pressure.  At the time of my evaluation, the patient is alert awake oriented and in no distress.  Past Medical History:  Diagnosis Date  . Diabetes mellitus without complication ([place])   . Neuropathy   . Stroke [facility])     Past Surgical History:  Procedure Laterality Date  . AMPUTATION Left 12/20/2019   Procedure: AMPUTATION 5TH TOE;  Surgeon: Erle Crocker, MD;  Location: [place];  Service: Orthopedics;  Laterality: Left;    Current Medications: Current Meds  Medication Sig  . aspirin EC 81 MG tablet Take 1 tablet (81 mg total) by mouth daily.  . carvedilol (COREG) 3.125 MG tablet Take 1 tablet (3.125 mg total) by mouth 2 (two) times daily with a meal.  . gabapentin (NEURONTIN) 100 MG capsule Take 1 capsule (100 mg total) by mouth at bedtime.  Marland Kitchen glimepiride (AMARYL) 1 MG tablet Take 1 tablet (1 mg total) by mouth daily with breakfast.  . hydrochlorothiazide (HYDRODIURIL) 25 MG tablet Take 1 tablet (25 mg total) by mouth  daily.  . losartan (COZAAR) 25 MG tablet Take 2 tablets (50 mg total) by mouth daily.  . metFORMIN (GLUCOPHAGE) 500 MG tablet Take 500 mg by mouth at bedtime.   . Multiple Vitamin (MULTIVITAMIN WITH MINERALS) TABS tablet Take 1 tablet by mouth daily with breakfast.  . mupirocin ointment (BACTROBAN) 2 % Apply 1 application topically daily. Apply topically to toe  . traMADol (ULTRAM) 50 MG tablet Take 1 tablet (50 mg total) by mouth every 6 (six) hours as needed for moderate pain.  Marland Kitchen VITAMIN D PO Take 1 tablet by mouth daily with breakfast.       Allergies:   Patient has no known allergies.   Social History   Socioeconomic History  . Marital status: Married    Spouse name: Not on file  . Number of children: Not on file  . Years of education: Not on file  . Highest education level: Not on file  Occupational History  . Not on file  Tobacco Use  . Smoking status: Never Smoker  . Smokeless tobacco: Never Used  Vaping Use  . Vaping Use: Never used  Substance and Sexual Activity  . Alcohol use: No  . Drug use: Never  . Sexual activity: Not on file  Other Topics Concern  . Not on file  Social History Narrative  . Not on file   Social Determinants of Health   Financial Resource Strain:   . Difficulty of Paying Living Expenses:   Food Insecurity:   . Worried About Charity fundraiser in the Last Year:   . Arboriculturist in the Last Year:   Transportation Needs:   . Film/video editor (Medical):   Marland Kitchen Lack of Transportation (Non-Medical):   Physical Activity:   . Days of Exercise per Week:   . Minutes of Exercise per Session:   Stress:   . Feeling of Stress :   Social Connections:   . Frequency of Communication with Friends and Family:   . Frequency of Social Gatherings with Friends and Family:   . Attends Religious Services:   . Active Member of Clubs or Organizations:   . Attends Archivist Meetings:   Marland Kitchen Marital Status:      Family History: The patient's family history is not on file.  ROS:   Please see the history of present illness.    All other systems reviewed and are negative.  EKGs/Labs/Other Studies Reviewed:    The following studies were reviewed today: I reviewed hospital records extensively.   Recent Labs: 12/22/2019: ALT 24; BUN 20; Creatinine, Ser 1.73; Hemoglobin 10.2; Magnesium 2.2; Platelets 173; Potassium 4.0; Sodium 139  Recent Lipid Panel    Component Value Date/Time   CHOL 193 12/21/2019 0123   TRIG 115 12/21/2019 0123   HDL 44 12/21/2019 0123   CHOLHDL 4.4  12/21/2019 0123   VLDL 23 12/21/2019 0123   LDLCALC 126 (H) 12/21/2019 0123    Physical Exam:    VS:  BP (!) 150/88   Pulse 90   Ht 5\' 8"  (1.727 m)   Wt 161 lb (73 kg)   SpO2 99%   BMI 24.48 kg/m     Wt Readings from Last 3 Encounters:  12/24/19 161 lb (73 kg)  12/22/19 162 lb 0.6 oz (73.5 kg)  12/03/19 163 lb 12.8 oz (74.3 kg)     GEN: Patient is in no acute distress HEENT: Normal NECK: No JVD; No carotid bruits LYMPHATICS: No lymphadenopathy CARDIAC:  Hear sounds regular, 2/6 systolic murmur at the apex. RESPIRATORY:  Clear to auscultation without rales, wheezing or rhonchi  ABDOMEN: Soft, non-tender, non-distended MUSCULOSKELETAL:  No edema; No deformity  SKIN: Warm and dry NEUROLOGIC:  Alert and oriented x 3 PSYCHIATRIC:  Normal affect   Signed, Jenean Lindau, MD  12/24/2019 8:43 AM    [place]

## 2019-12-24 NOTE — Patient Instructions (Signed)
Medication Instructions:  Your physician recommends that you continue on your current medications as directed. Please refer to the Current Medication list given to you today.  *If you need a refill on your cardiac medications before your next appointment, please call your pharmacy*   Lab Work: None ordered  If you have labs (blood work) drawn today and your tests are completely normal, you will receive your results only by:  [place] (if you have MyChart) OR  A paper copy in the mail If you have any lab test that is abnormal or we need to change your treatment, we will call you to review the results.   Testing/Procedures: None ordered   Follow-Up: At [facility], you and your health needs are our priority.  As part of our continuing mission to provide you with exceptional heart care, we have created designated Provider Care Teams.  These Care Teams include your primary Cardiologist (physician) and Advanced Practice Providers (APPs -  Physician Assistants and Nurse Practitioners) who all work together to provide you with the care you need, when you need it.  We recommend signing up for the patient portal called "MyChart".  Sign up information is provided on this After Visit Summary.  MyChart is used to connect with patients for Virtual Visits (Telemedicine).  Patients are able to view lab/test results, encounter notes, upcoming appointments, etc.  Non-urgent messages can be sent to your provider as well.   To learn more about what you can do with MyChart, go to [email or web address].    Your next appointment:   01/21/2020 ARRIVE AT 8:05 FOR REGISTRATION   The format for your next appointment:   In Person  Provider:   Jyl Heinz, MD   Other Instructions

## 2019-12-25 LAB — CULTURE, BLOOD (ROUTINE X 2)
Culture: NO GROWTH
Culture: NO GROWTH
Special Requests: ADEQUATE
Special Requests: ADEQUATE

## 2019-12-25 LAB — AEROBIC/ANAEROBIC CULTURE W GRAM STAIN (SURGICAL/DEEP WOUND)

## 2020-01-20 ENCOUNTER — Encounter: Payer: Self-pay | Admitting: Internal Medicine

## 2020-01-20 ENCOUNTER — Ambulatory Visit: Payer: No Typology Code available for payment source | Attending: Internal Medicine | Admitting: Internal Medicine

## 2020-01-20 ENCOUNTER — Other Ambulatory Visit: Payer: Self-pay

## 2020-01-20 VITALS — BP 206/94 | HR 66 | Temp 98.3°F | Resp 16 | Wt 164.8 lb

## 2020-01-20 DIAGNOSIS — N1831 Chronic kidney disease, stage 3a: Secondary | ICD-10-CM

## 2020-01-20 DIAGNOSIS — Z2821 Immunization not carried out because of patient refusal: Secondary | ICD-10-CM

## 2020-01-20 DIAGNOSIS — L97922 Non-pressure chronic ulcer of unspecified part of left lower leg with fat layer exposed: Secondary | ICD-10-CM | POA: Insufficient documentation

## 2020-01-20 DIAGNOSIS — Z09 Encounter for follow-up examination after completed treatment for conditions other than malignant neoplasm: Secondary | ICD-10-CM

## 2020-01-20 DIAGNOSIS — E1142 Type 2 diabetes mellitus with diabetic polyneuropathy: Secondary | ICD-10-CM

## 2020-01-20 DIAGNOSIS — I10 Essential (primary) hypertension: Secondary | ICD-10-CM

## 2020-01-20 DIAGNOSIS — S98132A Complete traumatic amputation of one left lesser toe, initial encounter: Secondary | ICD-10-CM

## 2020-01-20 DIAGNOSIS — Z8673 Personal history of transient ischemic attack (TIA), and cerebral infarction without residual deficits: Secondary | ICD-10-CM

## 2020-01-20 HISTORY — DX: Immunization not carried out because of patient refusal: Z28.21

## 2020-01-20 HISTORY — DX: Non-pressure chronic ulcer of unspecified part of left lower leg with fat layer exposed: L97.922

## 2020-01-20 LAB — GLUCOSE, POCT (MANUAL RESULT ENTRY): POC Glucose: 119 mg/dl — AB (ref 70–99)

## 2020-01-20 MED ORDER — PREGABALIN 25 MG PO CAPS: 25.0000 mg | ORAL_CAPSULE | Freq: Every day | ORAL | 4 refills | Status: DC

## 2020-01-20 MED ORDER — HYDROCHLOROTHIAZIDE 25 MG PO TABS: 25.0000 mg | ORAL_TABLET | Freq: Every day | ORAL | 6 refills | Status: DC

## 2020-01-20 MED ORDER — ATORVASTATIN CALCIUM 40 MG PO TABS: 40.0000 mg | ORAL_TABLET | Freq: Every day | ORAL | 6 refills | Status: DC

## 2020-01-20 MED ORDER — CARVEDILOL 3.125 MG PO TABS: 3.1250 mg | ORAL_TABLET | Freq: Two times a day (BID) | ORAL | 6 refills | Status: DC

## 2020-01-20 MED ORDER — AMLODIPINE BESYLATE 10 MG PO TABS: 10.0000 mg | ORAL_TABLET | Freq: Every day | ORAL | 6 refills | Status: DC

## 2020-01-20 MED ORDER — LOSARTAN POTASSIUM 25 MG PO TABS: 50.0000 mg | ORAL_TABLET | Freq: Every day | ORAL | 6 refills | Status: DC

## 2020-01-20 MED ORDER — GLIMEPIRIDE 1 MG PO TABS: 1.0000 mg | ORAL_TABLET | Freq: Every day | ORAL | 6 refills | Status: DC

## 2020-01-20 MED ORDER — METFORMIN HCL 500 MG PO TABS: 500.0000 mg | ORAL_TABLET | Freq: Every day | ORAL | 1 refills | Status: DC

## 2020-01-20 NOTE — Patient Instructions (Signed)
Your blood pressure is uncontrolled.  The goal is to be 130/80 or lower.  Check your blood pressure at least twice a week and record the numbers.  Bring in those numbers when you come to see the clinical pharmacist in 1 week for repeat blood pressure check.  Stop the gabapentin.  We will change it to a medication called Lyrica instead.  We will resume your cholesterol medicine in the form of Lipitor.

## 2020-01-20 NOTE — Progress Notes (Signed)
Patient ID: Douglas Briggs, male    DOB: 1960-12-31  MRN: [id number]  CC: Hospitalization Follow-up and Establish Care Patient hospitalized 7/25-20 12/2019  Subjective: Douglas Briggs is a 59 y.o. male who presents to establish care with me as PCP and for hospital follow-up.    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  At that time he was found to have an ulcer on his left small toe.  Given instruction for dressing changes and referred to podiatry.  However before he could be seen he was hospitalized with worsening of this ulcer that had become gangrenous.  Subsequently went amputation of this toe during hospitalization by Dr Lucia Gaskins.  Found to have an ulcer on the left lower leg.  He was seen by wound care and was instructed on self-care to get it to heal.  Today:  Left fifth toe amputation: He has seen the orthopedics since hospital discharge.  He states that his wound is healing well.  He has follow-up with him again within the next 1 to 2 weeks to have sutures removed.  He does dressing changes his self.  Ulcer left lower leg: Patient reports that this has scabbed over and is healing up well.  He uses Bactroban ointment on it and changes the dressing once every 2 weeks.  No pain on drainage at this time.  DM: Checking blood sugars once a day before breakfast.  Range has been 1 17-1 27.  Reports compliance with Metformin and Amaryl. -Endorses numbness in both feet.  He feels the gabapentin makes his feet ache more when he takes it. -Does well with eating habits. -He rides his bike every morning for  exercise. -Reports having had eye exam 1 month ago at [facility].  He was prescribed new glasses.  HTN: He did not take his medicines as yet for the morning.  He tries to limit salt in the foods.  Needing refills on medications.  Denies any chest pain, shortness of breath or lower extremity edema.  History of CVA: He is supposed to be on statin therapy.  However patient states it was discontinued prior to his amputation in hospital.  He continues to take aspirin.  He was to be on aspirin and Plavix for a total of 3 months but the Plavix was stopped prior to his recent surgery.  He was to see cardiology for TEE and placement of loop recorder as his stroke in June was thought to be embolic.  However it doesn't look as though he ever got into see the cardiologist.  Patient Active Problem List   Diagnosis Date Noted  . Cellulitis 12/19/2019  . 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 Diabetes mellitus without complication ([place])   . HLD (hyperlipidemia)   . Normocytic anemia   . CKD (chronic kidney disease)   . Essential hypertension 11/22/2019  . Atherosclerotic vascular disease 11/22/2019  . Stroke ([place]) 10/30/2019  . TIA (transient ischemic attack) 10/29/2019  . Stroke-like symptoms 10/29/2019  . Right sided numbness   . Acquired hammer toe of left foot 03/12/2017  . Tinea pedis of both feet 03/12/2017  .  Male hypogonadism 11/14/2016  . Restless leg syndrome 11/14/2016  . Uncontrolled type 2 diabetes mellitus with diabetic polyneuropathy, without long-term current use of insulin ([place]) 11/12/2016     Current Outpatient Medications on File Prior to Visit  Medication Sig Dispense Refill  . aspirin EC 81 MG tablet Take 1 tablet (81 mg total) by mouth daily. 150 tablet 2  . Multiple Vitamin (MULTIVITAMIN WITH MINERALS) TABS tablet Take 1 tablet by mouth daily with breakfast.    . mupirocin ointment (BACTROBAN) 2 % Apply 1 application topically daily. Apply topically to toe    . traMADol (ULTRAM)  50 MG tablet Take 1 tablet (50 mg total) by mouth every 6 (six) hours as needed for moderate pain. 15 tablet 0  . VITAMIN D PO Take 1 tablet by mouth daily with breakfast.      No current facility-administered medications on file prior to visit.    No Known Allergies  Social History   Socioeconomic History  . Marital status: Married    Spouse name: Not on file  . Number of children: Not on file  . Years of education: Not on file  . Highest education level: Not on file  Occupational History  . Not on file  Tobacco Use  . Smoking status: Never Smoker  . Smokeless tobacco: Never Used  Vaping Use  . Vaping Use: Never used  Substance and Sexual Activity  . Alcohol use: No  . Drug use: Never  . Sexual activity: Not on file  Other Topics Concern  . Not on file  Social History Narrative  . Not on file   Social Determinants of Health   Financial Resource Strain:   . Difficulty of Paying Living Expenses: Not on file  Food Insecurity:   . Worried About Charity fundraiser in the Last Year: Not on file  . Ran Out of Food in the Last Year: Not on file  Transportation Needs:   . Lack of Transportation (Medical): Not on file  . Lack of Transportation (Non-Medical): Not on file  Physical Activity:   . Days of Exercise per Week: Not on file  . Minutes of Exercise per Session: Not on file  Stress:   . Feeling of Stress : Not on file  Social Connections:   . Frequency of Communication with Friends and Family: Not on file  . Frequency of Social Gatherings with Friends and Family: Not on file  . Attends Religious Services: Not on file  . Active Member of Clubs or Organizations: Not on file  . Attends Archivist Meetings: Not on file  . Marital Status: Not on file  Intimate Partner Violence:   . Fear of Current or Ex-Partner: Not on file  . Emotionally Abused: Not on file  . Physically Abused: Not on file  . Sexually Abused: Not on file    No family history on  file.  Past Surgical History:  Procedure Laterality Date  . AMPUTATION Left 12/20/2019   Procedure: AMPUTATION 5TH TOE;  Surgeon: Erle Crocker, MD;  Location: [place];  Service: Orthopedics;  Laterality: Left;    ROS: Review of Systems Negative except as stated above  PHYSICAL EXAM: BP (!) 206/94   Pulse 66   Temp 98.3 F (36.8 C)   Resp 16   Wt 164 lb 12.8 oz (74.8 kg)   SpO2 99%   BMI 25.06 kg/m   Physical Exam  General appearance - alert, well appearing, middle-aged older African-American male  and in no distress Mental status - normal mood, behavior, speech, dress, motor activity, and thought processes Eyes - pupils equal and reactive, extraocular eye movements intact Mouth - mucous membranes moist, pharynx normal without lesions Neck - supple, no significant adenopathy Chest - clear to auscultation, no wheezes, rales or rhonchi, symmetric air entry Heart - normal rate, regular rhythm, normal S1, S2, no murmurs, rubs, clicks or gallops Extremities -trace lower extremity edema left greater than right. Skin -also on the left lower extremity lateral aspect above the lateral malleolus appears to be healing well.  He has nice pink flash.  No drainage noted. Diabetic Foot Exam - Simple   Simple Foot Form Visual Inspection See comments: Yes Sensation Testing See comments: Yes Pulse Check Posterior Tibialis and Dorsalis pulse intact bilaterally: Yes Comments Dorsalis pedis pulses and posterior tibialis pulses diminished bilaterally.  He is flat-footed.  The left fifth toe has been amputated.  He still has sutures in place.  Small opening remains.  No drainage appreciated.  He has some decreased sensation on the plantar surface of both feet.        Lab Results  Component Value Date   HGBA1C 6.9 (H) 10/30/2019    CMP Latest Ref Rng & Units 12/22/2019 12/21/2019 12/20/2019  Glucose 70 - 99 mg/dL 164(H) 198(H) 166(H)  BUN 6 - 20 mg/dL 20 26(H) 21(H)  Creatinine 0.61  - 1.24 mg/dL 1.73(H) 1.95(H) 1.45(H)  Sodium 135 - 145 mmol/L 139 136 137  Potassium 3.5 - 5.1 mmol/L 4.0 4.4 4.2  Chloride 98 - 111 mmol/L 108 105 105  CO2 22 - 32 mmol/L 25 23 22   Calcium 8.9 - 10.3 mg/dL 8.7(L) 8.5(L) 8.8(L)  Total Protein 6.5 - 8.1 g/dL 5.9(L) 5.7(L) 5.9(L)  Total Bilirubin 0.3 - 1.2 mg/dL 0.6 0.5 0.5  Alkaline Phos 38 - 126 U/L 154(H) 155(H) 173(H)  AST 15 - 41 U/L 13(L) 19 18  ALT 0 - 44 U/L 24 32 41   Lipid Panel     Component Value Date/Time   CHOL 193 12/21/2019 0123   TRIG 115 12/21/2019 0123   HDL 44 12/21/2019 0123   CHOLHDL 4.4 12/21/2019 0123   VLDL 23 12/21/2019 0123   LDLCALC 126 (H) 12/21/2019 0123    CBC    Component Value Date/Time   WBC 4.6 12/22/2019 0424   RBC 3.77 (L) 12/22/2019 0424   HGB 10.2 (L) 12/22/2019 0424   HCT 30.9 (L) 12/22/2019 0424   PLT 173 12/22/2019 0424   MCV 82.0 12/22/2019 0424   MCH 27.1 12/22/2019 0424   MCHC 33.0 12/22/2019 0424   RDW 12.7 12/22/2019 0424   LYMPHSABS 1.3 12/22/2019 0424   MONOABS 0.5 12/22/2019 0424   EOSABS 0.2 12/22/2019 0424   BASOSABS 0.0 12/22/2019 0424    ASSESSMENT AND PLAN: 1. Hospital discharge follow-up 2. Amputated toe of left foot ([place]) This appears to be healing well.  No wound dehiscence noted.  He will keep his follow-up appointment with the orthopedics to have sutures removed.  3. Type 2 diabetes mellitus with diabetic polyneuropathy, without long-term current use of insulin ([facility]) Controlled.  Encouraged him to continue healthy eating habits and regular exercise. Discontinue gabapentin.  Will give a trial of low-dose Lyrica for his neuropathy symptoms.  We can titrate the dose of Lyrica if his kidney function has improved. - POCT glucose (manual entry) - Microalbumin / creatinine urine ratio - CBC - Comprehensive metabolic panel - Lipid panel - pregabalin (LYRICA) 25  MG capsule; Take 1 capsule (25 mg total) by mouth daily.  Dispense: 30 capsule; Refill: 4 - glimepiride  (AMARYL) 1 MG tablet; Take 1 tablet (1 mg total) by mouth daily with breakfast.  Dispense: 30 tablet; Refill: 6 - metFORMIN (GLUCOPHAGE) 500 MG tablet; Take 1 tablet (500 mg total) by mouth at bedtime.  Dispense: 90 tablet; Refill: 1  4. Essential hypertension Not at goal.  He has not taken medicines as yet for the morning.  Advised him to take them as soon as he returns home.  Refills given.  DASH diet discussed and encouraged.  Follow-up with clinical pharmacist in 1 week for repeat blood pressure check. - amLODipine (NORVASC) 10 MG tablet; Take 1 tablet (10 mg total) by mouth daily.  Dispense: 30 tablet; Refill: 6 - hydrochlorothiazide (HYDRODIURIL) 25 MG tablet; Take 1 tablet (25 mg total) by mouth daily.  Dispense: 30 tablet; Refill: 6 - carvedilol (COREG) 3.125 MG tablet; Take 1 tablet (3.125 mg total) by mouth 2 (two) times daily with a meal.  Dispense: 60 tablet; Refill: 6 - losartan (COZAAR) 25 MG tablet; Take 2 tablets (50 mg total) by mouth daily.  Dispense: 30 tablet; Refill: 6  5. Ulcer of left lower extremity with fat layer exposed ([place]) Appears to be healing well.  He will continue to use the Bactroban ointment until this heals over completely  6. Stage 3a chronic kidney disease Check chemistry today.  Advised to avoid NSAIDs.  7. Influenza vaccination declined This was offered and patient declined.  8. History of lacunar cerebrovascular accident (CVA) Continue aspirin.  Restart Lipitor. - atorvastatin (LIPITOR) 40 MG tablet; Take 1 tablet (40 mg total) by mouth daily.  Dispense: 30 tablet; Refill: 6 - Ambulatory referral to Neurology    Patient was given the opportunity to ask questions.  Patient verbalized understanding of the plan and was able to repeat key elements of the plan.   Orders Placed This Encounter  Procedures  . Microalbumin / creatinine urine ratio  . CBC  . Comprehensive metabolic panel  . Lipid panel  . Ambulatory referral to Neurology  . POCT  glucose (manual entry)     Requested Prescriptions   Signed Prescriptions Disp Refills  . pregabalin (LYRICA) 25 MG capsule 30 capsule 4    Sig: Take 1 capsule (25 mg total) by mouth daily.  Marland Kitchen atorvastatin (LIPITOR) 40 MG tablet 30 tablet 6    Sig: Take 1 tablet (40 mg total) by mouth daily.  Marland Kitchen glimepiride (AMARYL) 1 MG tablet 30 tablet 6    Sig: Take 1 tablet (1 mg total) by mouth daily with breakfast.  . amLODipine (NORVASC) 10 MG tablet 30 tablet 6    Sig: Take 1 tablet (10 mg total) by mouth daily.  . hydrochlorothiazide (HYDRODIURIL) 25 MG tablet 30 tablet 6    Sig: Take 1 tablet (25 mg total) by mouth daily.  . carvedilol (COREG) 3.125 MG tablet 60 tablet 6    Sig: Take 1 tablet (3.125 mg total) by mouth 2 (two) times daily with a meal.  . losartan (COZAAR) 25 MG tablet 30 tablet 6    Sig: Take 2 tablets (50 mg total) by mouth daily.  . metFORMIN (GLUCOPHAGE) 500 MG tablet 90 tablet 1    Sig: Take 1 tablet (500 mg total) by mouth at bedtime.    Return in about 7 weeks (around 03/09/2020) for [facility] in 1 week for BP check.  Karle Plumber, MD, FACP

## 2020-01-21 ENCOUNTER — Encounter: Payer: Self-pay | Admitting: Cardiology

## 2020-01-21 ENCOUNTER — Ambulatory Visit (INDEPENDENT_AMBULATORY_CARE_PROVIDER_SITE_OTHER): Payer: No Typology Code available for payment source | Admitting: Cardiology

## 2020-01-21 VITALS — BP 184/92 | HR 66 | Ht 68.5 in | Wt 165.1 lb

## 2020-01-21 DIAGNOSIS — Z8673 Personal history of transient ischemic attack (TIA), and cerebral infarction without residual deficits: Secondary | ICD-10-CM

## 2020-01-21 DIAGNOSIS — N189 Chronic kidney disease, unspecified: Secondary | ICD-10-CM

## 2020-01-21 DIAGNOSIS — I7090 Unspecified atherosclerosis: Secondary | ICD-10-CM | POA: Diagnosis not present

## 2020-01-21 DIAGNOSIS — E782 Mixed hyperlipidemia: Secondary | ICD-10-CM | POA: Diagnosis not present

## 2020-01-21 DIAGNOSIS — I10 Essential (primary) hypertension: Secondary | ICD-10-CM | POA: Diagnosis not present

## 2020-01-21 DIAGNOSIS — Z5181 Encounter for therapeutic drug level monitoring: Secondary | ICD-10-CM

## 2020-01-21 DIAGNOSIS — Z79899 Other long term (current) drug therapy: Secondary | ICD-10-CM

## 2020-01-21 MED ORDER — CLONIDINE HCL 0.1 MG PO TABS: 0.1000 mg | ORAL_TABLET | Freq: Two times a day (BID) | ORAL | 11 refills | Status: DC

## 2020-01-21 NOTE — Addendum Note (Signed)
Addended by: Truddie Hidden on: 01/21/2020 09:09 AM   Modules accepted: Orders

## 2020-01-21 NOTE — Patient Instructions (Signed)
Medication Instructions:  Your physician has recommended you make the following change in your medication:   Start Clonidine 0.1 mg twice daily.  *If you need a refill on your cardiac medications before your next appointment, please call your pharmacy*   Lab Work: You need to have labs done when you are fasting.  You can come Monday through Friday 8:30 am to 12:00 pm and 1:15 to 4:30. You do not need to make an appointment as the order has already been placed. The labs you are going to have done are BMET,LFT and Lipids.  If you have labs (blood work) drawn today and your tests are completely normal, you will receive your results only by: Marland Kitchen MyChart Message (if you have MyChart) OR . A paper copy in the mail If you have any lab test that is abnormal or we need to change your treatment, we will call you to review the results.   Testing/Procedures: None ordered   Follow-Up: At [facility], you and your health needs are our priority.  As part of our continuing mission to provide you with exceptional heart care, we have created designated Provider Care Teams.  These Care Teams include your primary Cardiologist (physician) and Advanced Practice Providers (APPs -  Physician Assistants and Nurse Practitioners) who all work together to provide you with the care you need, when you need it.  We recommend signing up for the patient portal called "MyChart".  Sign up information is provided on this After Visit Summary.  MyChart is used to connect with patients for Virtual Visits (Telemedicine).  Patients are able to view lab/test results, encounter notes, upcoming appointments, etc.  Non-urgent messages can be sent to your provider as well.   To learn more about what you can do with MyChart, go to [email or web address].    Your next appointment:   1 month(s)  The format for your next appointment:   In Person  Provider:   Jyl Heinz, MD   Other Instructions  Managing Your  Hypertension Hypertension is commonly called high blood pressure. This is when the force of your blood pressing against the walls of your arteries is too strong. Arteries are blood vessels that carry blood from your heart throughout your body. Hypertension forces the heart to work harder to pump blood, and may cause the arteries to become narrow or stiff. Having untreated or uncontrolled hypertension can cause heart attack, stroke, kidney disease, and other problems. What are blood pressure readings? A blood pressure reading consists of a higher number over a lower number. Ideally, your blood pressure should be below 120/80. The first ("top") number is called the systolic pressure. It is a measure of the pressure in your arteries as your heart beats. The second ("bottom") number is called the diastolic pressure. It is a measure of the pressure in your arteries as the heart relaxes. What does my blood pressure reading mean? Blood pressure is classified into four stages. Based on your blood pressure reading, your health care provider may use the following stages to determine what type of treatment you need, if any. Systolic pressure and diastolic pressure are measured in a unit called mm Hg. Normal  Systolic pressure: below 127.  Diastolic pressure: below 80. Elevated  Systolic pressure: 517-001.  Diastolic pressure: below 80. Hypertension stage 1  Systolic pressure: 749-449.  Diastolic pressure: 67-59. Hypertension stage 2  Systolic pressure: 163 or above.  Diastolic pressure: 90 or above. What health risks are associated with hypertension? Managing your hypertension  is an important responsibility. Uncontrolled hypertension can lead to:  A heart attack.  A stroke.  A weakened blood vessel (aneurysm).  Heart failure.  Kidney damage.  Eye damage.  Metabolic syndrome.  Memory and concentration problems. What changes can I make to manage my hypertension? Hypertension can be  managed by making lifestyle changes and possibly by taking medicines. Your health care provider will help you make a plan to bring your blood pressure within a normal range. Eating and drinking   Eat a diet that is high in fiber and potassium, and low in salt (sodium), added sugar, and fat. An example eating plan is called the DASH (Dietary Approaches to Stop Hypertension) diet. To eat this way: ? Eat plenty of fresh fruits and vegetables. Try to fill half of your plate at each meal with fruits and vegetables. ? Eat whole grains, such as whole wheat pasta, brown rice, or whole grain bread. Fill about one quarter of your plate with whole grains. ? Eat low-fat diary products. ? Avoid fatty cuts of meat, processed or cured meats, and poultry with skin. Fill about one quarter of your plate with lean proteins such as fish, chicken without skin, beans, eggs, and tofu. ? Avoid premade and processed foods. These tend to be higher in sodium, added sugar, and fat.  Reduce your daily sodium intake. Most people with hypertension should eat less than 1,500 mg of sodium a day.  Limit alcohol intake to no more than 1 drink a day for nonpregnant women and 2 drinks a day for men. One drink equals 12 oz of beer, 5 oz of wine, or 1 oz of hard liquor. Lifestyle  Work with your health care provider to maintain a healthy body weight, or to lose weight. Ask what an ideal weight is for you.  Get at least 30 minutes of exercise that causes your heart to beat faster (aerobic exercise) most days of the week. Activities may include walking, swimming, or biking.  Include exercise to strengthen your muscles (resistance exercise), such as weight lifting, as part of your weekly exercise routine. Try to do these types of exercises for 30 minutes at least 3 days a week.  Do not use any products that contain nicotine or tobacco, such as cigarettes and e-cigarettes. If you need help quitting, ask your health care  provider.  Control any long-term (chronic) conditions you have, such as high cholesterol or diabetes. Monitoring  Monitor your blood pressure at home as told by your health care provider. Your personal target blood pressure may vary depending on your medical conditions, your age, and other factors.  Have your blood pressure checked regularly, as often as told by your health care provider. Working with your health care provider  Review all the medicines you take with your health care provider because there may be side effects or interactions.  Talk with your health care provider about your diet, exercise habits, and other lifestyle factors that may be contributing to hypertension.  Visit your health care provider regularly. Your health care provider can help you create and adjust your plan for managing hypertension. Will I need medicine to control my blood pressure? Your health care provider may prescribe medicine if lifestyle changes are not enough to get your blood pressure under control, and if:  Your systolic blood pressure is 130 or higher.  Your diastolic blood pressure is 80 or higher. Take medicines only as told by your health care provider. Follow the directions carefully. Blood pressure  medicines must be taken as prescribed. The medicine does not work as well when you skip doses. Skipping doses also puts you at risk for problems. Contact a health care provider if:  You think you are having a reaction to medicines you have taken.  You have repeated (recurrent) headaches.  You feel dizzy.  You have swelling in your ankles.  You have trouble with your vision. Get help right away if:  You develop a severe headache or confusion.  You have unusual weakness or numbness, or you feel faint.  You have severe pain in your chest or abdomen.  You vomit repeatedly.  You have trouble breathing. Summary  Hypertension is when the force of blood pumping through your arteries is  too strong. If this condition is not controlled, it may put you at risk for serious complications.  Your personal target blood pressure may vary depending on your medical conditions, your age, and other factors. For most people, a normal blood pressure is less than 120/80.  Hypertension is managed by lifestyle changes, medicines, or both. Lifestyle changes include weight loss, eating a healthy, low-sodium diet, exercising more, and limiting alcohol. This information is not intended to replace advice given to you by your health care provider. Make sure you discuss any questions you have with your health care provider. Document Revised: 09/04/2018 Document Reviewed: 04/10/2016 Elsevier Patient Education  [place].  Hypertension, Adult Hypertension is another name for high blood pressure. High blood pressure forces your heart to work harder to pump blood. This can cause problems over time. There are two numbers in a blood pressure reading. There is a top number (systolic) over a bottom number (diastolic). It is best to have a blood pressure that is below 120/80. Healthy choices can help lower your blood pressure, or you may need medicine to help lower it. What are the causes? The cause of this condition is not known. Some conditions may be related to high blood pressure. What increases the risk?  Smoking.  Having type 2 diabetes mellitus, high cholesterol, or both.  Not getting enough exercise or physical activity.  Being overweight.  Having too much fat, sugar, calories, or salt (sodium) in your diet.  Drinking too much alcohol.  Having long-term (chronic) kidney disease.  Having a family history of high blood pressure.  Age. Risk increases with age.  Race. You may be at higher risk if you are African American.  Gender. Men are at higher risk than women before age 73. After age 39, women are at higher risk than men.  Having obstructive sleep apnea.  Stress. What are  the signs or symptoms?  High blood pressure may not cause symptoms. Very high blood pressure (hypertensive crisis) may cause: ? Headache. ? Feelings of worry or nervousness (anxiety). ? Shortness of breath. ? Nosebleed. ? A feeling of being sick to your stomach (nausea). ? Throwing up (vomiting). ? Changes in how you see. ? Very bad chest pain. ? Seizures. How is this treated?  This condition is treated by making healthy lifestyle changes, such as: ? Eating healthy foods. ? Exercising more. ? Drinking less alcohol.  Your health care provider may prescribe medicine if lifestyle changes are not enough to get your blood pressure under control, and if: ? Your top number is above 130. ? Your bottom number is above 80.  Your personal target blood pressure may vary. Follow these instructions at home: Eating and drinking   If told, follow the DASH eating  plan. To follow this plan: ? Fill one half of your plate at each meal with fruits and vegetables. ? Fill one fourth of your plate at each meal with whole grains. Whole grains include whole-wheat pasta, brown rice, and whole-grain bread. ? Eat or drink low-fat dairy products, such as skim milk or low-fat yogurt. ? Fill one fourth of your plate at each meal with low-fat (lean) proteins. Low-fat proteins include fish, chicken without skin, eggs, beans, and tofu. ? Avoid fatty meat, cured and processed meat, or chicken with skin. ? Avoid pre-made or processed food.  Eat less than 1,500 mg of salt each day.  Do not drink alcohol if: ? Your doctor tells you not to drink. ? You are pregnant, may be pregnant, or are planning to become pregnant.  If you drink alcohol: ? Limit how much you use to:  0-1 drink a day for women.  0-2 drinks a day for men. ? Be aware of how much alcohol is in your drink. In the [place] one drink equals one 12 oz bottle of beer (355 mL), one 5 oz glass of wine (148 mL), or one 1 oz glass of hard liquor (44  mL). Lifestyle   Work with your doctor to stay at a healthy weight or to lose weight. Ask your doctor what the best weight is for you.  Get at least 30 minutes of exercise most days of the week. This may include walking, swimming, or biking.  Get at least 30 minutes of exercise that strengthens your muscles (resistance exercise) at least 3 days a week. This may include lifting weights or doing Pilates.  Do not use any products that contain nicotine or tobacco, such as cigarettes, e-cigarettes, and chewing tobacco. If you need help quitting, ask your doctor.  Check your blood pressure at home as told by your doctor.  Keep all follow-up visits as told by your doctor. This is important. Medicines  Take over-the-counter and prescription medicines only as told by your doctor. Follow directions carefully.  Do not skip doses of blood pressure medicine. The medicine does not work as well if you skip doses. Skipping doses also puts you at risk for problems.  Ask your doctor about side effects or reactions to medicines that you should watch for. Contact a doctor if you:  Think you are having a reaction to the medicine you are taking.  Have headaches that keep coming back (recurring).  Feel dizzy.  Have swelling in your ankles.  Have trouble with your vision. Get help right away if you:  Get a very bad headache.  Start to feel mixed up (confused).  Feel weak or numb.  Feel faint.  Have very bad pain in your: ? Chest. ? Belly (abdomen).  Throw up more than once.  Have trouble breathing. Summary  Hypertension is another name for high blood pressure.  High blood pressure forces your heart to work harder to pump blood.  For most people, a normal blood pressure is less than 120/80.  Making healthy choices can help lower blood pressure. If your blood pressure does not get lower with healthy choices, you may need to take medicine. This information is not intended to replace  advice given to you by your health care provider. Make sure you discuss any questions you have with your health care provider. Document Revised: 01/21/2018 Document Reviewed: 01/21/2018 Elsevier Patient Education  [place]. Clonidine tablets What is this medicine? CLONIDINE (KLOE ni deen) is used to  treat high blood pressure. This medicine may be used for other purposes; ask your health care provider or pharmacist if you have questions. COMMON [place] NAME(S): Catapres What should I tell my health care provider before I take this medicine? They need to know if you have any of these conditions:  kidney disease  an unusual or allergic reaction to clonidine, other medicines, foods, dyes, or preservatives  pregnant or trying to get pregnant  breast-feeding How should I use this medicine? Take this medicine by mouth with a glass of water. Follow the directions on the prescription label. Take your doses at regular intervals. Do not take your medicine more often than directed. Do not suddenly stop taking this medicine. You must gradually reduce the dose or you may get a dangerous increase in blood pressure. Ask your doctor or health care professional for advice. Talk to your pediatrician regarding the use of this medicine in children. Special care may be needed. Overdosage: If you think you have taken too much of this medicine contact a poison control center or emergency room at once. NOTE: This medicine is only for you. Do not share this medicine with others. What if I miss a dose? If you miss a dose, take it as soon as you can. If it is almost time for your next dose, take only that dose. Do not take double or extra doses. What may interact with this medicine? Do not take this medicine with any of the following medications:  MAOIs like Carbex, Eldepryl, Marplan, Nardil, and Parnate This medicine may also interact with the following medications:  barbiturate medicines for inducing  sleep or treating seizures like phenobarbital  certain medicines for blood pressure, heart disease, irregular heart beat  certain medicines for depression, anxiety, or psychotic disturbances  prescription pain medicines This list may not describe all possible interactions. Give your health care provider a list of all the medicines, herbs, non-prescription drugs, or dietary supplements you use. Also tell them if you smoke, drink alcohol, or use illegal drugs. Some items may interact with your medicine. What should I watch for while using this medicine? Visit your doctor or health care professional for regular checks on your progress. Check your heart rate and blood pressure regularly while you are taking this medicine. Ask your doctor or health care professional what your heart rate should be and when you should contact him or her. You may get drowsy or dizzy. Do not drive, use machinery, or do anything that needs mental alertness until you know how this medicine affects you. To avoid dizzy or fainting spells, do not stand or sit up quickly, especially if you are an older person. Alcohol can make you more drowsy and dizzy. Avoid alcoholic drinks. Your mouth may get dry. Chewing sugarless gum or sucking hard candy, and drinking plenty of water will help. Do not treat yourself for coughs, colds, or pain while you are taking this medicine without asking your doctor or health care professional for advice. Some ingredients may increase your blood pressure. If you are going to have surgery tell your doctor or health care professional that you are taking this medicine. What side effects may I notice from receiving this medicine? Side effects that you should report to your doctor or health care professional as soon as possible:  allergic reactions like skin rash, itching or hives, swelling of the face, lips, or tongue  anxiety, nervousness  chest pain  depression  fast, irregular  heartbeat  swelling of feet  or legs  unusually weak or tired Side effects that usually do not require medical attention (report to your doctor or health care professional if they continue or are bothersome):  change in sex drive or performance  constipation  headache This list may not describe all possible side effects. Call your doctor for medical advice about side effects. You may report side effects to FDA at 1-800-FDA-1088. Where should I keep my medicine? Keep out of the reach of children. Store at room temperature between 15 and 30 degrees C (59 and 86 degrees F). Protect from light. Keep container tightly closed. Throw away any unused medicine after the expiration date. NOTE: This sheet is a summary. It may not cover all possible information. If you have questions about this medicine, talk to your doctor, pharmacist, or health care provider.  2020 Elsevier/Gold Standard (2010-11-07 13:01:28)

## 2020-01-21 NOTE — Progress Notes (Signed)
Cardiology Office Note:    Date:  01/21/2020   ID:  Douglas Briggs, DOB 06/27/1960, MRN [id number]  PCP:  Ladell Pier, MD  Cardiologist:  Jenean Lindau, MD   Referring MD: No ref. provider found    ASSESSMENT:    1. Atherosclerotic vascular disease   2. Essential hypertension   3. Mixed hyperlipidemia   4. Chronic kidney disease, unspecified CKD stage    PLAN:    In order of problems listed above:  1. Atherosclerotic vascular disease: Secondary prevention stressed with the patient.  Importance of compliance with diet medication stressed any vocalized understanding.  He is on a coated aspirin on a daily basis. 2. Essential hypertension: Blood pressure is elevated.  He has renal insufficiency also.  I will initiate him on clonidine 0.1 milligrams on a daily basis.  I am not sure whether I want to start angiotensin receptor blockers on him.  I am not sure about compliance issues here.  Electrolytes will have to be monitored closely and I am not sure whether he will comply with that so initially I will start clonidine.  I discussed this with him and he vocalized understanding.  He will keep a close track of his blood pressures and get his blood pressures and machine for him in the next visit. 3. Mixed dyslipidemia: Diet was emphasized.  He is fasting today and blood work will be done. 4. Diabetes mellitus: Blood work will be done today and this will be forwarded to his primary care physician.   5. Chronic renal insufficiency: Stable at this time and managed by primary care physician. 6. Follow-up appointment in a month or earlier if he has any concerns.   Medication Adjustments/Labs and Tests Ordered: Current medicines are reviewed at length with the patient today.  Concerns regarding medicines are outlined above.  No orders of the defined types were placed in this encounter.  No orders of the defined types were placed in this encounter.    No chief complaint on file.     History of Present Illness:    Douglas Briggs is a 59 y.o. male.  Patient has past medical history of atherosclerotic vascular disease, essential hypertension dyslipidemia and stroke.  He denies any problems at this time.  He is a diabetic.  He takes care of activities of daily living.  He has had amputation of the toe and is walking on a regular basis he plans to increase his activity.  He does not keep good track of his blood pressures but plans to do from now on.  At the time of my evaluation, the patient is alert awake oriented and in no distress.  Past Medical History:  Diagnosis Date  . Diabetes mellitus without complication ([place])   . Neuropathy   . Stroke [facility])     Past Surgical History:  Procedure Laterality Date  . AMPUTATION Left 12/20/2019   Procedure: AMPUTATION 5TH TOE;  Surgeon: Erle Crocker, MD;  Location: [place];  Service: Orthopedics;  Laterality: Left;    Current Medications: Current Meds  Medication Sig  . amLODipine (NORVASC) 10 MG tablet Take 1 tablet (10 mg total) by mouth daily.  Marland Kitchen aspirin EC 81 MG tablet Take 1 tablet (81 mg total) by mouth daily.  Marland Kitchen atorvastatin (LIPITOR) 40 MG tablet Take 1 tablet (40 mg total) by mouth daily.  . carvedilol (COREG) 3.125 MG tablet Take 1 tablet (3.125 mg total) by mouth 2 (two) times daily with a meal.  .  glimepiride (AMARYL) 1 MG tablet Take 1 tablet (1 mg total) by mouth daily with breakfast.  . hydrochlorothiazide (HYDRODIURIL) 25 MG tablet Take 1 tablet (25 mg total) by mouth daily.  Marland Kitchen JANUVIA 50 MG tablet Take 50 mg by mouth daily.  Marland Kitchen losartan (COZAAR) 25 MG tablet Take 2 tablets (50 mg total) by mouth daily.  . metFORMIN (GLUCOPHAGE) 500 MG tablet Take 1 tablet (500 mg total) by mouth at bedtime.  . Multiple Vitamin (MULTIVITAMIN WITH MINERALS) TABS tablet Take 1 tablet by mouth daily with breakfast.  . mupirocin ointment (BACTROBAN) 2 % Apply 1 application topically daily. Apply topically to toe  . pregabalin  (LYRICA) 25 MG capsule Take 1 capsule (25 mg total) by mouth daily.  . traMADol (ULTRAM) 50 MG tablet Take 1 tablet (50 mg total) by mouth every 6 (six) hours as needed for moderate pain.  Marland Kitchen VITAMIN D PO Take 1 tablet by mouth daily with breakfast.      Allergies:   Patient has no known allergies.   Social History   Socioeconomic History  . Marital status: Married    Spouse name: Not on file  . Number of children: Not on file  . Years of education: Not on file  . Highest education level: Not on file  Occupational History  . Not on file  Tobacco Use  . Smoking status: Never Smoker  . Smokeless tobacco: Never Used  Vaping Use  . Vaping Use: Never used  Substance and Sexual Activity  . Alcohol use: No  . Drug use: Never  . Sexual activity: Not on file  Other Topics Concern  . Not on file  Social History Narrative  . Not on file   Social Determinants of Health   Financial Resource Strain:   . Difficulty of Paying Living Expenses: Not on file  Food Insecurity:   . Worried About Charity fundraiser in the Last Year: Not on file  . Ran Out of Food in the Last Year: Not on file  Transportation Needs:   . Lack of Transportation (Medical): Not on file  . Lack of Transportation (Non-Medical): Not on file  Physical Activity:   . Days of Exercise per Week: Not on file  . Minutes of Exercise per Session: Not on file  Stress:   . Feeling of Stress : Not on file  Social Connections:   . Frequency of Communication with Friends and Family: Not on file  . Frequency of Social Gatherings with Friends and Family: Not on file  . Attends Religious Services: Not on file  . Active Member of Clubs or Organizations: Not on file  . Attends Archivist Meetings: Not on file  . Marital Status: Not on file     Family History: The patient's family history is negative for Hypertension, Diabetes, and Heart disease.  ROS:   Please see the history of present illness.    All other  systems reviewed and are negative.  EKGs/Labs/Other Studies Reviewed:    The following studies were reviewed today: I reviewed records and discussed with the patient at length   Recent Labs: 12/22/2019: ALT 24; BUN 20; Creatinine, Ser 1.73; Hemoglobin 10.2; Magnesium 2.2; Platelets 173; Potassium 4.0; Sodium 139  Recent Lipid Panel    Component Value Date/Time   CHOL 193 12/21/2019 0123   TRIG 115 12/21/2019 0123   HDL 44 12/21/2019 0123   CHOLHDL 4.4 12/21/2019 0123   VLDL 23 12/21/2019 0123   LDLCALC 126 (  H) 12/21/2019 0123    Physical Exam:    VS:  BP (!) 184/92   Pulse 66   Ht 5' 8.5" (1.74 m)   Wt 165 lb 1.3 oz (74.9 kg)   SpO2 99%   BMI 24.74 kg/m     Wt Readings from Last 3 Encounters:  01/21/20 165 lb 1.3 oz (74.9 kg)  01/20/20 164 lb 12.8 oz (74.8 kg)  12/24/19 161 lb (73 kg)     GEN: Patient is in no acute distress HEENT: Normal NECK: No JVD; No carotid bruits LYMPHATICS: No lymphadenopathy CARDIAC: Hear sounds regular, 2/6 systolic murmur at the apex. RESPIRATORY:  Clear to auscultation without rales, wheezing or rhonchi  ABDOMEN: Soft, non-tender, non-distended MUSCULOSKELETAL:  No edema; No deformity  SKIN: Warm and dry NEUROLOGIC:  Alert and oriented x 3 PSYCHIATRIC:  Normal affect   Signed, Jenean Lindau, MD  01/21/2020 8:54 AM    [place]

## 2020-01-22 LAB — BASIC METABOLIC PANEL
BUN/Creatinine Ratio: 19 (ref 9–20)
BUN: 32 mg/dL — ABNORMAL HIGH (ref 6–24)
CO2: 20 mmol/L (ref 20–29)
Calcium: 9 mg/dL (ref 8.7–10.2)
Chloride: 107 mmol/L — ABNORMAL HIGH (ref 96–106)
Creatinine, Ser: 1.66 mg/dL — ABNORMAL HIGH (ref 0.76–1.27)
GFR calc Af Amer: 51 mL/min/{1.73_m2} — ABNORMAL LOW (ref >59)
GFR calc non Af Amer: 44 mL/min/{1.73_m2} — ABNORMAL LOW (ref >59)
Glucose: 132 mg/dL — ABNORMAL HIGH (ref 65–99)
Potassium: 5 mmol/L (ref 3.5–5.2)
Sodium: 139 mmol/L (ref 134–144)

## 2020-01-22 LAB — LIPID PANEL
Chol/HDL Ratio: 5.6 ratio — ABNORMAL HIGH (ref 0.0–5.0)
Cholesterol, Total: 333 mg/dL — ABNORMAL HIGH (ref 100–199)
HDL: 59 mg/dL (ref >39)
LDL Chol Calc (NIH): 243 mg/dL — ABNORMAL HIGH (ref 0–99)
Triglycerides: 161 mg/dL — ABNORMAL HIGH (ref 0–149)
VLDL Cholesterol Cal: 31 mg/dL (ref 5–40)

## 2020-01-22 LAB — HEPATIC FUNCTION PANEL
ALT: 44 IU/L (ref 0–44)
AST: 25 IU/L (ref 0–40)
Albumin: 3.1 g/dL — ABNORMAL LOW (ref 3.8–4.9)
Alkaline Phosphatase: 181 IU/L — ABNORMAL HIGH (ref 48–121)
Bilirubin Total: <0.2 mg/dL (ref 0.0–1.2)
Bilirubin, Direct: 0.08 mg/dL (ref 0.00–0.40)
Total Protein: 6.1 g/dL (ref 6.0–8.5)

## 2020-01-26 MED ORDER — ATORVASTATIN CALCIUM 80 MG PO TABS: 80.0000 mg | ORAL_TABLET | Freq: Every day | ORAL | 3 refills | Status: DC

## 2020-01-26 NOTE — Progress Notes (Unsigned)
NOTES Compliance? Took meds this morning? When do you take your meds (time of day)? Dizziness, headaches, blurred vision? History of swelling? Check Clinic BP?  Home BP logs? If no logs, bring to next visit w/ BP cuff Go over BP goals Additional BP therapy if needed . eGFR 51, K is 5 . Not maxed out: losartan (K), coreg (HR?), clonidine; move one to evening? Diet??  Exercise??   -PMH: HTN, HLD, T2DM with neuropathy and proteinuria, CKD 3, anemia of chronic disease, history of TIA w/ evidence of stroke on MRI, PAD -Was started on statin 8/26 by PCP (LDL 243 8/27), clonidine started by cards 8/27 -Declined flu vaccine at PCP visit    S:    Patient arrives ***.    Presents to the clinic for hypertension evaluation, counseling, and management.  Patient was referred and last seen by Primary Care Provider on 8/26.   Medication adherence *** .  Current BP Medications include:  Amlodipine 10 mg daily, carvedilol 3.125 mg BID, clonidine 0.1 mg BID, hydrochlorothiazide 25 mg daily, losartan 50 mg daily  Antihypertensives tried in the past include: ***  Dietary habits include: *** Exercise habits include:*** Family / Social history: ***  ASCVD risk factors include:***   O:   Home BP readings: ***  Last 3 Office BP readings: BP Readings from Last 3 Encounters:  01/21/20 (!) 184/92  01/20/20 (!) 206/94  12/24/19 (!) 150/88    BMET    Component Value Date/Time   NA 139 01/21/2020 0925   K 5.0 01/21/2020 0925   CL 107 (H) 01/21/2020 0925   CO2 20 01/21/2020 0925   GLUCOSE 132 (H) 01/21/2020 0925   GLUCOSE 164 (H) 12/22/2019 0424   BUN 32 (H) 01/21/2020 0925   CREATININE 1.66 (H) 01/21/2020 0925   CALCIUM 9.0 01/21/2020 0925   GFRNONAA 44 (L) 01/21/2020 0925   GFRAA 51 (L) 01/21/2020 0925    Renal function: Estimated Creatinine Clearance: 47.2 mL/min (A) (by C-G formula based on SCr of 1.66 mg/dL (H)).  Clinical ASCVD: Yes  The ASCVD Risk score Mikey Bussing DC Jr., et  al., 2013) failed to calculate for the following reasons:   The patient has a prior MI or stroke diagnosis  PHQ-2 Score: ***   A/P: Hypertension diagnosed *** currently *** on current medications. BP Goal = < *** mmHg. Medication adherence ***.  -{Meds adjust:18428} ***.  -F/u labs ordered - *** -Counseled on lifestyle modifications for blood pressure control including reduced dietary sodium, increased exercise, adequate sleep.  Results reviewed and written information provided.   Total time in face-to-face counseling *** minutes.   F/U Clinic Visit in ***.

## 2020-01-26 NOTE — Addendum Note (Signed)
Addended by: Truddie Hidden on: 01/26/2020 10:56 AM   Modules accepted: Orders

## 2020-01-27 ENCOUNTER — Ambulatory Visit: Payer: No Typology Code available for payment source | Admitting: Pharmacist

## 2020-02-02 NOTE — Progress Notes (Unsigned)
NOTES: labs today? Compliance? Took meds this morning? When do you take your meds (time of day)? Dizziness, headaches, blurred vision? History of swelling? Check Clinic BP?  Home BP logs? If no logs, bring to next visit w/ BP cuff Go over BP goals Additional BP therapy if needed  eGFR 51, K is 5  Not maxed out: losartan (K), coreg (HR?), clonidine; move one to evening?  Diet??  Exercise??   -PMH: HTN, HLD, T2DM with neuropathy and proteinuria, CKD 3, anemia of chronic disease, history of TIA w/ evidence of stroke on MRI, PAD -Was started on statin 8/26 by PCP (LDL 243 8/27), clonidine started by cards 8/27 -Declined flu vaccine at PCP visit    S:    Patient arrives ***.    Presents to the clinic for hypertension evaluation, counseling, and management.  Patient was referred and last seen by Primary Care Provider on 8/26.   Medication adherence *** .  Current BP Medications include:  Amlodipine 10 mg daily, carvedilol 3.125 mg BID, clonidine 0.1 mg BID, hydrochlorothiazide 25 mg daily, losartan 50 mg daily  Antihypertensives tried in the past include: ***  Dietary habits include: *** Exercise habits include:*** Family / Social history: ***  ASCVD risk factors include:***   O:   Home BP readings: ***  Last 3 Office BP readings: BP Readings from Last 3 Encounters:  01/21/20 (!) 184/92  01/20/20 (!) 206/94  12/24/19 (!) 150/88    BMET    Component Value Date/Time   NA 139 01/21/2020 0925   K 5.0 01/21/2020 0925   CL 107 (H) 01/21/2020 0925   CO2 20 01/21/2020 0925   GLUCOSE 132 (H) 01/21/2020 0925   GLUCOSE 164 (H) 12/22/2019 0424   BUN 32 (H) 01/21/2020 0925   CREATININE 1.66 (H) 01/21/2020 0925   CALCIUM 9.0 01/21/2020 0925   GFRNONAA 44 (L) 01/21/2020 0925   GFRAA 51 (L) 01/21/2020 0925    Renal function: Estimated Creatinine Clearance: 47.2 mL/min (A) (by C-G formula based on SCr of 1.66 mg/dL (H)).  Clinical ASCVD: Yes  The ASCVD Risk score Mikey Bussing  DC Jr., et al., 2013) failed to calculate for the following reasons:   The patient has a prior MI or stroke diagnosis  PHQ-2 Score: ***   A/P: Hypertension diagnosed *** currently *** on current medications. BP Goal = < *** mmHg. Medication adherence ***.  -{Meds adjust:18428} ***.  -F/u labs ordered - *** -Counseled on lifestyle modifications for blood pressure control including reduced dietary sodium, increased exercise, adequate sleep.  Results reviewed and written information provided.   Total time in face-to-face counseling *** minutes.   F/U Clinic Visit in ***.

## 2020-02-03 ENCOUNTER — Encounter: Payer: Self-pay | Admitting: Vascular Surgery

## 2020-02-03 ENCOUNTER — Ambulatory Visit: Payer: No Typology Code available for payment source | Admitting: Pharmacist

## 2020-02-03 ENCOUNTER — Ambulatory Visit (INDEPENDENT_AMBULATORY_CARE_PROVIDER_SITE_OTHER): Payer: No Typology Code available for payment source | Admitting: Vascular Surgery

## 2020-02-03 ENCOUNTER — Other Ambulatory Visit: Payer: Self-pay

## 2020-02-03 VITALS — BP 193/90 | HR 70 | Temp 98.1°F | Resp 20 | Ht 68.5 in | Wt 162.0 lb

## 2020-02-03 DIAGNOSIS — I739 Peripheral vascular disease, unspecified: Secondary | ICD-10-CM

## 2020-02-03 NOTE — Progress Notes (Signed)
Patient is a 59 year old male who returns for follow-up today.  He was seen as an hospital consult June 2021.  At that time he had an acute stroke.  As part of his work-up he was noted to have an incidental finding of a left superficial femoral artery occlusion on a DVT ultrasound.  Patient has had several years of tightness in his left calf with walking.  But he has noticed since he got out of the hospital he has really not had any left leg symptoms.  He rides a bicycle and has no problems with his legs.  He also has a history of peripheral neuropathy.  He states that most of his symptoms feel like a tightness around the foot down both legs which has been chronic for quite some time.  Further evaluation of his legs was deferred previously due to his recent stroke.  He now returns for further follow-up.  His ABI in June 2021 was 0.87 on the right 0.66 on the left.  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 Lucia Gaskins from orthopedics in July 2021.  This was done for an infection in his left fifth toe.    Of note his stroke work-up consisted of a CT angiogram of the head and neck which showed supraclinoid carotid stenosis on the left side.  No carotid bifurcation stenosis.  MRI of the brain showed right frontal left temporal parietal strokes.  He is currently on aspirin and a statin.  He has diabetes which has been stable.   Past Medical History:  Diagnosis Date  . Diabetes mellitus without complication ([place])   . Neuropathy   . Stroke [facility])     Past Surgical History:  Procedure Laterality Date  . AMPUTATION Left 12/20/2019   Procedure: AMPUTATION 5TH TOE;  Surgeon: Erle Crocker, MD;  Location: [place];  Service: Orthopedics;  Laterality: Left;    Current Outpatient Medications on File Prior to Visit  Medication Sig Dispense Refill  . amLODipine (NORVASC) 10 MG tablet Take 1 tablet (10 mg total) by mouth daily. 30 tablet 6  . aspirin EC 81 MG tablet Take 1 tablet (81 mg total) by mouth  daily. 150 tablet 2  . atorvastatin (LIPITOR) 80 MG tablet Take 1 tablet (80 mg total) by mouth daily. 90 tablet 3  . carvedilol (COREG) 3.125 MG tablet Take 1 tablet (3.125 mg total) by mouth 2 (two) times daily with a meal. 60 tablet 6  . cloNIDine (CATAPRES) 0.1 MG tablet Take 1 tablet (0.1 mg total) by mouth 2 (two) times daily. 60 tablet 11  . glimepiride (AMARYL) 1 MG tablet Take 1 tablet (1 mg total) by mouth daily with breakfast. 30 tablet 6  . hydrochlorothiazide (HYDRODIURIL) 25 MG tablet Take 1 tablet (25 mg total) by mouth daily. 30 tablet 6  . JANUVIA 50 MG tablet Take 50 mg by mouth daily.    Marland Kitchen losartan (COZAAR) 25 MG tablet Take 2 tablets (50 mg total) by mouth daily. 30 tablet 6  . metFORMIN (GLUCOPHAGE) 500 MG tablet Take 1 tablet (500 mg total) by mouth at bedtime. 90 tablet 1  . Multiple Vitamin (MULTIVITAMIN WITH MINERALS) TABS tablet Take 1 tablet by mouth daily with breakfast.    . mupirocin ointment (BACTROBAN) 2 % Apply 1 application topically daily. Apply topically to toe    . pregabalin (LYRICA) 25 MG capsule Take 1 capsule (25 mg total) by mouth daily. 30 capsule 4  . traMADol (ULTRAM) 50  MG tablet Take 1 tablet (50 mg total) by mouth every 6 (six) hours as needed for moderate pain. 15 tablet 0  . VITAMIN D PO Take 1 tablet by mouth daily with breakfast.      No current facility-administered medications on file prior to visit.    No Known Allergies   Review of systems: He has no shortness of breath.  He has no chest pain.  Physical exam:  Vitals:   02/03/20 1124  BP: (!) 193/90  Pulse: 70  Resp: 20  Temp: 98.1 F (36.7 C)  SpO2: 97%  Weight: 162 lb (73.5 kg)  Height: 5' 8.5" (1.74 m)    Extremities: No palpable pedal pulses 2+ femoral pulses healed left fifth toe amputation small ulceration left pretibial region healing  Assessment: Bilateral lower extremity peripheral arterial disease.  Patient recently healed a left toe amputation spontaneously and  should have adequate perfusion in his left leg.  He does not have any symptoms of claudication.  He does have a feeling of tightness and dysesthesias in both feet consistent with neuropathy.  Plan: Patient will have follow-up ABIs in 1 year and be seen in our APP clinic.  He will follow-up sooner if he develops claudication symptoms or nonhealing wounds.  Patient was hypertensive in the office today.  He states he did not take his blood pressure medication this morning as he prefers not to take this when he drives.  However, he did not really say he has any symptoms so he was encouraged to take his blood pressure medicine daily.  Risk of stroke was discussed.  Ruta Hinds, MD [facility] Office: [phone]

## 2020-02-07 NOTE — Progress Notes (Unsigned)
NOTES: labs today? Compliance? Took meds this morning? When do you take your meds (time of day)? Dizziness, headaches, blurred vision? History of swelling? Check Clinic BP?  Home BP logs? If no logs, bring to next visit w/ BP cuff Go over BP goals Additional BP therapy if needed . eGFR 51, K is 5 . Not maxed out: losartan (K), coreg (HR?), clonidine; move one to evening?  Diet??  Exercise??   -PMH: HTN, HLD, T2DM with neuropathy and proteinuria, CKD 3, anemia of chronic disease, history of TIA w/ evidence of stroke on MRI, PAD -Was started on statin 8/26 by PCP (LDL 243 8/27), clonidine started by cards 8/27 -Declined flu vaccine at PCP visit     S:    Patient arrives ***.    Presents to the clinic for hypertension evaluation, counseling, and management.  Patient was referred and last seen by Primary Care Provider on 8/26.   Medication adherence *** .  Current BP Medications include:  Amlodipine 10 mg daily, carvedilol 3.125 mg BID, clonidine 0.1 mg BID, hydrochlorothiazide 25 mg daily, losartan 50 mg daily  Antihypertensives tried in the past include: ***  Dietary habits include: *** Exercise habits include:*** Family / Social history: ***  ASCVD risk factors include:***   O:   Home BP readings: ***  Last 3 Office BP readings: BP Readings from Last 3 Encounters:  02/03/20 (!) 193/90  01/21/20 (!) 184/92  01/20/20 (!) 206/94    BMET    Component Value Date/Time   NA 139 01/21/2020 0925   K 5.0 01/21/2020 0925   CL 107 (H) 01/21/2020 0925   CO2 20 01/21/2020 0925   GLUCOSE 132 (H) 01/21/2020 0925   GLUCOSE 164 (H) 12/22/2019 0424   BUN 32 (H) 01/21/2020 0925   CREATININE 1.66 (H) 01/21/2020 0925   CALCIUM 9.0 01/21/2020 0925   GFRNONAA 44 (L) 01/21/2020 0925   GFRAA 51 (L) 01/21/2020 0925    Renal function: Estimated Creatinine Clearance: 47.2 mL/min (A) (by C-G formula based on SCr of 1.66 mg/dL (H)).  Clinical ASCVD: Yes  The ASCVD Risk score  Mikey Bussing DC Jr., et al., 2013) failed to calculate for the following reasons:   The patient has a prior MI or stroke diagnosis  PHQ-2 Score: ***   A/P: Hypertension diagnosed *** currently *** on current medications. BP Goal = < *** mmHg. Medication adherence ***.  -{Meds adjust:18428} ***.  -F/u labs ordered - *** -Counseled on lifestyle modifications for blood pressure control including reduced dietary sodium, increased exercise, adequate sleep.  Results reviewed and written information provided.   Total time in face-to-face counseling *** minutes.   F/U Clinic Visit in ***.

## 2020-02-08 ENCOUNTER — Ambulatory Visit: Payer: No Typology Code available for payment source | Admitting: Pharmacist

## 2020-02-09 ENCOUNTER — Ambulatory Visit: Payer: No Typology Code available for payment source | Admitting: Cardiology

## 2020-02-09 ENCOUNTER — Encounter: Payer: Self-pay | Admitting: Internal Medicine

## 2020-02-09 DIAGNOSIS — E113299 Type 2 diabetes mellitus with mild nonproliferative diabetic retinopathy without macular edema, unspecified eye: Secondary | ICD-10-CM

## 2020-02-09 HISTORY — DX: Type 2 diabetes mellitus with mild nonproliferative diabetic retinopathy without macular edema, unspecified eye: E11.3299

## 2020-02-17 DIAGNOSIS — G629 Polyneuropathy, unspecified: Secondary | ICD-10-CM | POA: Insufficient documentation

## 2020-02-17 DIAGNOSIS — E08319 Diabetes mellitus due to underlying condition with unspecified diabetic retinopathy without macular edema: Secondary | ICD-10-CM | POA: Insufficient documentation

## 2020-02-17 DIAGNOSIS — I152 Hypertension secondary to endocrine disorders: Secondary | ICD-10-CM | POA: Insufficient documentation

## 2020-02-18 ENCOUNTER — Encounter (HOSPITAL_BASED_OUTPATIENT_CLINIC_OR_DEPARTMENT_OTHER): Payer: Self-pay

## 2020-02-18 ENCOUNTER — Emergency Department (HOSPITAL_BASED_OUTPATIENT_CLINIC_OR_DEPARTMENT_OTHER)
Admission: EM | Admit: 2020-02-18 | Discharge: 2020-02-18 | Disposition: A | Discharge status: Home / Self Care | Payer: Self-pay | Attending: Emergency Medicine | Admitting: Emergency Medicine

## 2020-02-18 ENCOUNTER — Ambulatory Visit: Payer: No Typology Code available for payment source | Admitting: Cardiology

## 2020-02-18 ENCOUNTER — Other Ambulatory Visit: Payer: Self-pay

## 2020-02-18 DIAGNOSIS — E1122 Type 2 diabetes mellitus with diabetic chronic kidney disease: Secondary | ICD-10-CM | POA: Insufficient documentation

## 2020-02-18 DIAGNOSIS — E11319 Type 2 diabetes mellitus with unspecified diabetic retinopathy without macular edema: Secondary | ICD-10-CM | POA: Insufficient documentation

## 2020-02-18 DIAGNOSIS — E1140 Type 2 diabetes mellitus with diabetic neuropathy, unspecified: Secondary | ICD-10-CM | POA: Insufficient documentation

## 2020-02-18 DIAGNOSIS — Z7982 Long term (current) use of aspirin: Secondary | ICD-10-CM | POA: Insufficient documentation

## 2020-02-18 DIAGNOSIS — R112 Nausea with vomiting, unspecified: Secondary | ICD-10-CM | POA: Insufficient documentation

## 2020-02-18 DIAGNOSIS — Z7984 Long term (current) use of oral hypoglycemic drugs: Secondary | ICD-10-CM | POA: Insufficient documentation

## 2020-02-18 DIAGNOSIS — I10 Essential (primary) hypertension: Secondary | ICD-10-CM

## 2020-02-18 DIAGNOSIS — I129 Hypertensive chronic kidney disease with stage 1 through stage 4 chronic kidney disease, or unspecified chronic kidney disease: Secondary | ICD-10-CM | POA: Insufficient documentation

## 2020-02-18 DIAGNOSIS — N189 Chronic kidney disease, unspecified: Secondary | ICD-10-CM | POA: Insufficient documentation

## 2020-02-18 DIAGNOSIS — R1033 Periumbilical pain: Secondary | ICD-10-CM | POA: Insufficient documentation

## 2020-02-18 DIAGNOSIS — Z79899 Other long term (current) drug therapy: Secondary | ICD-10-CM | POA: Insufficient documentation

## 2020-02-18 LAB — LIPASE, BLOOD: Lipase: 44 U/L (ref 11–51)

## 2020-02-18 LAB — CBC
HCT: 32.8 % — ABNORMAL LOW (ref 39.0–52.0)
Hemoglobin: 10.9 g/dL — ABNORMAL LOW (ref 13.0–17.0)
MCH: 27.3 pg (ref 26.0–34.0)
MCHC: 33.2 g/dL (ref 30.0–36.0)
MCV: 82.2 fL (ref 80.0–100.0)
Platelets: 180 10*3/uL (ref 150–400)
RBC: 3.99 MIL/uL — ABNORMAL LOW (ref 4.22–5.81)
RDW: 13.8 % (ref 11.5–15.5)
WBC: 5.6 10*3/uL (ref 4.0–10.5)
nRBC: 0 % (ref 0.0–0.2)

## 2020-02-18 LAB — COMPREHENSIVE METABOLIC PANEL
ALT: 28 U/L (ref 0–44)
AST: 20 U/L (ref 15–41)
Albumin: 2.5 g/dL — ABNORMAL LOW (ref 3.5–5.0)
Alkaline Phosphatase: 118 U/L (ref 38–126)
Anion gap: 9 (ref 5–15)
BUN: 19 mg/dL (ref 6–20)
CO2: 23 mmol/L (ref 22–32)
Calcium: 8.4 mg/dL — ABNORMAL LOW (ref 8.9–10.3)
Chloride: 108 mmol/L (ref 98–111)
Creatinine, Ser: 1.47 mg/dL — ABNORMAL HIGH (ref 0.61–1.24)
GFR calc Af Amer: 60 mL/min — ABNORMAL LOW (ref >60)
GFR calc non Af Amer: 51 mL/min — ABNORMAL LOW (ref >60)
Glucose, Bld: 157 mg/dL — ABNORMAL HIGH (ref 70–99)
Potassium: 4 mmol/L (ref 3.5–5.1)
Sodium: 140 mmol/L (ref 135–145)
Total Bilirubin: 0.4 mg/dL (ref 0.3–1.2)
Total Protein: 5.9 g/dL — ABNORMAL LOW (ref 6.5–8.1)

## 2020-02-18 MED ORDER — ONDANSETRON 4 MG PO TBDP: 4.0000 mg | ORAL_TABLET | Freq: Three times a day (TID) | ORAL | 0 refills | Status: DC | PRN

## 2020-02-18 MED ORDER — ONDANSETRON HCL 4 MG/2ML IJ SOLN
4.0000 mg | Freq: Once | INTRAMUSCULAR | Status: AC
  Administered 2020-02-18: 4 mg via INTRAVENOUS
  Filled 2020-02-18: qty 2

## 2020-02-18 MED ORDER — HYDROCHLOROTHIAZIDE 25 MG PO TABS
25.0000 mg | ORAL_TABLET | Freq: Once | ORAL | Status: AC
  Administered 2020-02-18: 25 mg via ORAL
  Filled 2020-02-18: qty 1

## 2020-02-18 MED ORDER — SODIUM CHLORIDE 0.9 % IV BOLUS
1000.0000 mL | Freq: Once | INTRAVENOUS | Status: AC
  Administered 2020-02-18: 1000 mL via INTRAVENOUS

## 2020-02-18 MED ORDER — CLONIDINE HCL 0.1 MG PO TABS
0.2000 mg | ORAL_TABLET | Freq: Once | ORAL | Status: AC
  Administered 2020-02-18: 0.2 mg via ORAL
  Filled 2020-02-18: qty 2

## 2020-02-18 MED ORDER — AMLODIPINE BESYLATE 5 MG PO TABS
10.0000 mg | ORAL_TABLET | Freq: Once | ORAL | Status: AC
  Administered 2020-02-18: 10 mg via ORAL
  Filled 2020-02-18: qty 2

## 2020-02-18 NOTE — ED Provider Notes (Signed)
[facility] EMERGENCY DEPARTMENT Provider Note   CSN: [id number] Arrival date & time: 02/18/20  1321     History Chief Complaint  Patient presents with  . Abdominal Pain    Douglas Briggs is a 59 y.o. male with PMH significant for type II DM, HTN, HLD, and CVA who presents the ED with complaints of crampy periumbilical abdominal discomfort.  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  He reports that this is never happened before.  He has not had anything to eat or drink today due to his nausea symptoms.  Patient reports that his abdominal cramping is mild.  He states that his last normal bowel movement was yesterday, nonbloody.  Approximately 48 hours ago he had a salad that he believes may have been questionable.  He denies any fevers or chills, chest pain or shortness of breath, dysuria or increased urinary frequency, cough or shortness of breath, headache or dizziness, congestion, or any other symptoms.  HPI     Past Medical History:  Diagnosis Date  . Acquired hammer toe of left foot 03/12/2017  . Atherosclerotic vascular disease 11/22/2019  . Cellulitis 12/19/2019  . CKD (chronic kidney disease)   . Diabetes mellitus due to underlying condition with retinopathy of both eyes ([place])   . 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 Essential hypertension 11/22/2019  . HLD (hyperlipidemia)   . Hypertension   . Influenza vaccination declined 01/20/2020  . Male hypogonadism 11/14/2016  . Neuropathy   . Normocytic anemia   . NPDR (nonproliferative diabetic retinopathy) ([place]) 02/09/2020   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 Dr. Tacy Dura - seen 11/24/19  . Restless leg syndrome 11/14/2016  . Right sided numbness   . Stroke ([place])   . Stroke-like symptoms 10/29/2019  . TIA (transient ischemic attack) 10/29/2019  . Tinea pedis of both feet 03/12/2017   . Ulcer of left lower extremity with fat layer exposed ([place]) 01/20/2020  . Uncontrolled type 2 diabetes mellitus with diabetic polyneuropathy, without long-term current use of insulin ([place]) 11/12/2016    Patient Active Problem List   Diagnosis Date Noted  . Diabetes mellitus due to underlying condition with retinopathy of both eyes ([place])   . Hypertension   . Neuropathy   . NPDR (nonproliferative diabetic retinopathy) ([place]) 02/09/2020  . Ulcer of left lower extremity with fat layer exposed ([place]) 01/20/2020  . Influenza vaccination declined 01/20/2020  . Cellulitis 12/19/2019  . 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 HLD (hyperlipidemia)   . Normocytic anemia   . CKD (chronic kidney disease)   . Essential hypertension 11/22/2019  . Atherosclerotic vascular disease 11/22/2019  . Stroke ([place]) 10/30/2019  . TIA (transient ischemic attack) 10/29/2019  . Stroke-like symptoms 10/29/2019  . Right sided numbness   . Acquired hammer toe of left foot 03/12/2017  . Tinea pedis of both feet 03/12/2017  . Male hypogonadism 11/14/2016  . Restless leg syndrome 11/14/2016  . Uncontrolled type 2 diabetes mellitus with diabetic polyneuropathy, without long-term current use of insulin ([place]) 11/12/2016    Past Surgical History:  Procedure Laterality Date  . AMPUTATION Left 12/20/2019   Procedure: AMPUTATION 5TH TOE;  Surgeon: Erle Crocker, MD;  Location: [facility];  Service: Orthopedics;  Laterality: Left;       Family History  Problem Relation Age of Onset  . Hypertension Neg  Hx   . Diabetes Neg Hx   . Heart disease Neg Hx     Social History   Tobacco Use  . Smoking status: Never Smoker  . Smokeless tobacco: Never Used  Vaping Use  . Vaping Use: Never used  Substance Use Topics  . Alcohol use: No  . Drug use: Never    Home Medications Prior to Admission medications   Medication Sig Start Date End Date Taking? Authorizing Provider  amLODipine (NORVASC) 10 MG tablet Take 1 tablet  (10 mg total) by mouth daily. 01/20/20   Ladell Pier, MD  aspirin EC 81 MG tablet Take 1 tablet (81 mg total) by mouth daily. 10/31/19 10/30/20  Elodia Florence., MD  atorvastatin (LIPITOR) 80 MG tablet Take 1 tablet (80 mg total) by mouth daily. 01/26/20   Revankar, Reita Cliche, MD  carvedilol (COREG) 3.125 MG tablet Take 1 tablet (3.125 mg total) by mouth 2 (two) times daily with a meal. 01/20/20   Ladell Pier, MD  cloNIDine (CATAPRES) 0.1 MG tablet Take 1 tablet (0.1 mg total) by mouth 2 (two) times daily. 01/21/20   Revankar, Reita Cliche, MD  glimepiride (AMARYL) 1 MG tablet Take 1 tablet (1 mg total) by mouth daily with breakfast. 01/20/20   Ladell Pier, MD  hydrochlorothiazide (HYDRODIURIL) 25 MG tablet Take 1 tablet (25 mg total) by mouth daily. 01/20/20   Ladell Pier, MD  JANUVIA 50 MG tablet Take 50 mg by mouth daily. 12/22/19   [provider]  losartan (COZAAR) 25 MG tablet Take 2 tablets (50 mg total) by mouth daily. 01/20/20   Ladell Pier, MD  metFORMIN (GLUCOPHAGE) 500 MG tablet Take 1 tablet (500 mg total) by mouth at bedtime. 01/20/20   Ladell Pier, MD  Multiple Vitamin (MULTIVITAMIN WITH MINERALS) TABS tablet Take 1 tablet by mouth daily with breakfast.    [provider]  mupirocin ointment (BACTROBAN) 2 % Apply 1 application topically daily. Apply topically to toe 12/03/19   [provider]  ondansetron (ZOFRAN ODT) 4 MG disintegrating tablet Take 1 tablet (4 mg total) by mouth every 8 (eight) hours as needed for nausea or vomiting. 02/18/20   Corena Herter, PA-C  pregabalin (LYRICA) 25 MG capsule Take 1 capsule (25 mg total) by mouth daily. 01/20/20   Ladell Pier, MD  traMADol (ULTRAM) 50 MG tablet Take 1 tablet (50 mg total) by mouth every 6 (six) hours as needed for moderate pain. 12/22/19   Donne Hazel, MD  VITAMIN D PO Take 1 tablet by mouth daily with breakfast.     [provider]    Allergies     Patient has no known allergies.  Review of Systems   Review of Systems  All other systems reviewed and are negative.   Physical Exam Updated Vital Signs BP (!) 208/101 (BP Location: Right Arm)   Pulse 90   Temp 98.4 F (36.9 C) (Oral)   Resp 18   Ht 5\' 8"  (1.727 m)   Wt 73.9 kg   SpO2 99%   BMI 24.78 kg/m   Physical Exam Vitals and nursing note reviewed. Exam conducted with a chaperone present.  Constitutional:      General: He is not in acute distress.    Appearance: Normal appearance. He is not ill-appearing.  HENT:     Head: Normocephalic and atraumatic.  Eyes:     General: No scleral icterus.    Conjunctiva/sclera: Conjunctivae  normal.  Cardiovascular:     Rate and Rhythm: Normal rate and regular rhythm.     Pulses: Normal pulses.     Heart sounds: Normal heart sounds.  Pulmonary:     Effort: Pulmonary effort is normal. No respiratory distress.     Breath sounds: Normal breath sounds. No wheezing.  Abdominal:     Comments: Soft, nondistended.  No areas of TTP.  No guarding.  No overlying skin changes.  No peritoneal signs.  Normoactive bowel sounds.  Musculoskeletal:        General: Normal range of motion.     Cervical back: Normal range of motion and neck supple. No rigidity.  Skin:    General: Skin is dry.     Capillary Refill: Capillary refill takes less than 2 seconds.  Neurological:     Mental Status: He is alert and oriented to person, place, and time.     GCS: GCS eye subscore is 4. GCS verbal subscore is 5. GCS motor subscore is 6.  Psychiatric:        Mood and Affect: Mood normal.        Behavior: Behavior normal.        Thought Content: Thought content normal.      ED Results / Procedures / Treatments   Labs (all labs ordered are listed, but only abnormal results are displayed) Labs Reviewed  COMPREHENSIVE METABOLIC PANEL - Abnormal; Notable for the following components:      Result Value   Glucose, Bld 157 (*)    Creatinine, Ser 1.47 (*)     Calcium 8.4 (*)    Total Protein 5.9 (*)    Albumin 2.5 (*)    GFR calc non Af Amer 51 (*)    GFR calc Af Amer 60 (*)    All other components within normal limits  CBC - Abnormal; Notable for the following components:   RBC 3.99 (*)    Hemoglobin 10.9 (*)    HCT 32.8 (*)    All other components within normal limits  LIPASE, BLOOD    EKG EKG Interpretation  Date/Time:  Friday February 18 2020 13:49:37 EDT Ventricular Rate:  89 PR Interval:  134 QRS Duration: 86 QT Interval:  368 QTC Calculation: 447 R Axis:   43 Text Interpretation: Normal sinus rhythm Normal ECG No significant change since last tracing Confirmed by Calvert Cantor [phone]) on 02/18/2020 6:50:51 PM   Radiology No results found.  Procedures Procedures (including critical care time)  Medications Ordered in ED Medications  sodium chloride 0.9 % bolus 1,000 mL (1,000 mLs Intravenous New Bag/Given 02/18/20 1613)  ondansetron (ZOFRAN) injection 4 mg (4 mg Intravenous Given 02/18/20 1609)  cloNIDine (CATAPRES) tablet 0.2 mg (0.2 mg Oral Given 02/18/20 1814)  hydrochlorothiazide (HYDRODIURIL) tablet 25 mg (25 mg Oral Given 02/18/20 1814)  amLODipine (NORVASC) tablet 10 mg (10 mg Oral Given 02/18/20 1815)    ED Course  I have reviewed the triage vital signs and the nursing notes.  Pertinent labs & imaging results that were available during my care of the patient were reviewed by me and considered in my medical decision making (see chart for details).    MDM Rules/Calculators/A&P                          Patient presents the ED with a 1 day history of inability to tolerate food or liquid by mouth due to nausea symptoms.  He states that  he is having nausea and periumbilical abdominal "cramping".  He denies any significant pain.  No fevers or chills.  No other symptoms suggestive of systemic illness.  He states that he may have consumed a bad salad 24 hours prior to onset of symptoms.  Will obtain basic  laboratory work-up.  Will provide 1 L IV NS and Zofran for nausea.  Patient had a colonoscopy recently and was told that he is good for 10 years.  Labs CMP: Renal function consistent with baseline.  No elevated BUN.  No electrolyte derangement.  Normal anion gap. Lipase: WNL. CBC: Mild anemia with 10.9 hemoglobin.  This is improved when compared to patient's recent labs.  No leukocytosis concerning for infection.  Patient's laboratory work-up and physical exam was reassuring.  Plan for p.o. challenge and discharge with outpatient follow-up.  Patient with elevated BP today.  He reports that he did not take his antihypertensive medication this morning due to his nausea.  Unlikely to be related to CC and patient is without symptoms concerning for end organ damage. I discussed with patient their elevated blood pressure and need for close outpatient management of their hypertension. Patient counseled on long-term effects of elevated BP including kidney damage, vascular damage, retinopathy, and risk of stroke and other dangerous outcomes. Patient understanding of close PCP follow up.   His BP rose to over 332 systolic during his ED encounter.  At approximately 5 PM, he was administered his at home antihypertensive medications given that he had not taken them today due to his nausea symptoms.  He states that otherwise he has been taking his medications, as directed.  He can follow-up with his primary care provider.  Considered dissection, but patient has not had any abdominal pain since last evening.  Nontender.  Peripheral pulses intact and symmetric.  He states that his symptoms of nausea have entirely resolved after administration of fluids and Zofran.  He is no longer endorsing any symptoms and is wanting to go home.  I suspect that this is a viral GI illness that will be self-limiting.  Encouraging oral hydration and regular meals.  Will prescribe Zofran to have as-needed for nausea.  He will f/u with his  PCP regarding today's encounter.  All of the evaluation and work-up results were discussed with the patient and any family at bedside.  Patient and/or family were informed that while patient is appropriate for discharge at this time, some medical emergencies may only develop or become detectable after a period of time.  I specifically instructed patient and/or family to return to return to the ED or seek immediate medical attention for any new or worsening symptoms.  They were provided opportunity to ask any additional questions and have none at this time.  Prior to discharge patient is feeling well, agreeable with plan for discharge home.  They have expressed understanding of verbal discharge instructions as well as return precautions and are agreeable to the plan.    Final Clinical Impression(s) / ED Diagnoses Final diagnoses:  Non-intractable vomiting with nausea, unspecified vomiting type    Rx / DC Orders ED Discharge Orders         Ordered    ondansetron (ZOFRAN ODT) 4 MG disintegrating tablet  Every 8 hours PRN        02/18/20 1827           Corena Herter, PA-C 02/18/20 1851    Elnora Morrison, MD 02/19/20 2322

## 2020-02-18 NOTE — ED Notes (Signed)
Patient denies pain and is resting comfortably.  

## 2020-02-18 NOTE — ED Triage Notes (Addendum)
Pt c/o generalized abd pain n/v started last night-NAD-steady gait

## 2020-02-18 NOTE — ED Notes (Signed)
Per pt unable to provide urine collection at this time. 

## 2020-02-18 NOTE — ED Notes (Signed)
Pt ambulatory with steady gait to room 

## 2020-02-18 NOTE — Discharge Instructions (Addendum)
I have prescribed you Zofran ODT which you can take for your symptoms of nausea.  I suspect that this is a self-limiting viral infection.  I am glad that your symptoms of nausea and abdominal cramping have been alleviated here in the ED.  You did not take your blood pressure medication this morning due to your nausea symptoms.  I suspect that is why your blood pressure is elevated here in the ED.  Please continue with your medications at home, as directed.  I would also like for you to follow-up with your primary care provider regarding today's encounter for ongoing evaluation and management.  Please return to the ED or seek immediate medical attention should you experience any new or worsening symptoms.

## 2020-02-21 ENCOUNTER — Encounter: Payer: Self-pay | Admitting: Internal Medicine

## 2020-02-21 ENCOUNTER — Other Ambulatory Visit: Payer: Self-pay | Admitting: Pharmacist

## 2020-02-21 ENCOUNTER — Telehealth: Payer: Self-pay | Admitting: Internal Medicine

## 2020-02-21 NOTE — Telephone Encounter (Signed)
Marlene Lard, pts daughter, called and is requesting to speak with nurse regarding his medications. She states that the pt is not sure what medications he is supposed to be taking, the dose, and the times of day he is supposed to take them. Please advise.

## 2020-02-21 NOTE — Telephone Encounter (Signed)
Touched base with Ms. Berenson. Informed her who I was and the purpose of my call. We covered her father's medications, including name, purpose, dosing, and counseling pearls. Ms. Weil verbalized understanding.

## 2020-02-21 NOTE — Telephone Encounter (Signed)
Lurena Joiner could you follow up with pt

## 2020-02-24 ENCOUNTER — Ambulatory Visit: Payer: Self-pay

## 2020-02-24 NOTE — Telephone Encounter (Signed)
Copied from [place] [phone]. Topic: Appointment Scheduling - Scheduling Inquiry for Clinic >> Feb 24, 2020 11:54 AM Oneta Rack wrote: Patient unable to hold urine and gait is off. Seeking clinical advice. Sent message to practice for a possible work in prior to 03/02/2020 appointment.

## 2020-02-24 NOTE — Telephone Encounter (Signed)
Attempt to call patient back at [phone]. Unable to reach, no answer and mailbox is full. A call was placed to the [phone] number. LVM to return call.    Since these are new sx's and onset was yesterday. Would recommend patient be seen at [facility] or ED. No appt availability tomorrow.

## 2020-02-24 NOTE — Telephone Encounter (Signed)
Patient unable to hold urine and gait is off. Seeking clinical advice. Sent message to practice for a possible work in prior to 03/02/2020 appointment  Patient's daughter called back- she states patient is experiencing incontinence, more concentrated urine and unsteady gait. Advised needs appointment- she wants to bring patient tomorrow- no appointments available- ( lunch time call) message sent for review- daughter to expect call back- she is aware  She may be directed to UC if patient can't be seen within 24 hour period.  Reason for Disposition . [1] Can't control passage of urine (i.e., urinary incontinence) AND [2] new-onset (< 2 weeks) or worsening  Answer Assessment - Initial Assessment Questions 1. SYMPTOM: "What's the main symptom you're concerned about?" (e.g., frequency, incontinence)     incontinenc 2. ONSET: "When did the  incontinence  start?"     yesterday 3. PAIN: "Is there any pain?" If Yes, ask: "How bad is it?" (Scale: 1-10; mild, moderate, severe)     No complaining 4. CAUSE: "What do you think is causing the symptoms?"     unsure 5. OTHER SYMPTOMS: "Do you have any other symptoms?" (e.g., fever, flank pain, blood in urine, pain with urination)     Chills, unsteady on feet 6. PREGNANCY: "Is there any chance you are pregnant?" "When was your last menstrual period?"     n/a  Protocols used: URINARY [facility]

## 2020-02-24 NOTE — Telephone Encounter (Signed)
Attempted to call daughter Delia Heady.  Left VM to return call to office.

## 2020-02-24 NOTE — Telephone Encounter (Signed)
Returned pt daughter call and scheduled an appt with fulp for 10/1 @11 :10 am

## 2020-02-25 ENCOUNTER — Other Ambulatory Visit: Payer: Self-pay

## 2020-02-25 ENCOUNTER — Ambulatory Visit: Payer: Self-pay | Attending: Family Medicine | Admitting: Family Medicine

## 2020-02-25 ENCOUNTER — Encounter: Payer: Self-pay | Admitting: Family Medicine

## 2020-02-25 VITALS — BP 145/77 | HR 71 | Temp 98.0°F | Ht 68.0 in | Wt 168.0 lb

## 2020-02-25 DIAGNOSIS — D649 Anemia, unspecified: Secondary | ICD-10-CM

## 2020-02-25 DIAGNOSIS — R319 Hematuria, unspecified: Secondary | ICD-10-CM

## 2020-02-25 DIAGNOSIS — Z7982 Long term (current) use of aspirin: Secondary | ICD-10-CM

## 2020-02-25 DIAGNOSIS — N1831 Chronic kidney disease, stage 3a: Secondary | ICD-10-CM

## 2020-02-25 DIAGNOSIS — R32 Unspecified urinary incontinence: Secondary | ICD-10-CM

## 2020-02-25 DIAGNOSIS — I10 Essential (primary) hypertension: Secondary | ICD-10-CM

## 2020-02-25 DIAGNOSIS — R21 Rash and other nonspecific skin eruption: Secondary | ICD-10-CM

## 2020-02-25 DIAGNOSIS — E441 Mild protein-calorie malnutrition: Secondary | ICD-10-CM

## 2020-02-25 DIAGNOSIS — E1142 Type 2 diabetes mellitus with diabetic polyneuropathy: Secondary | ICD-10-CM

## 2020-02-25 DIAGNOSIS — Z09 Encounter for follow-up examination after completed treatment for conditions other than malignant neoplasm: Secondary | ICD-10-CM

## 2020-02-25 LAB — POCT URINALYSIS DIP (CLINITEK)
Bilirubin, UA: NEGATIVE
Glucose, UA: NEGATIVE mg/dL
Ketones, POC UA: NEGATIVE mg/dL
Leukocytes, UA: NEGATIVE
Nitrite, UA: NEGATIVE
POC PROTEIN,UA: >=300 — AB
Spec Grav, UA: 1.025
Urobilinogen, UA: 0.2 U/dL
pH, UA: 5.5

## 2020-02-25 MED ORDER — NYSTATIN 100000 UNIT/GM EX CREA: 1.0000 "application " | TOPICAL_CREAM | Freq: Two times a day (BID) | CUTANEOUS | 3 refills | Status: DC

## 2020-02-25 MED ORDER — SULFAMETHOXAZOLE-TRIMETHOPRIM 800-160 MG PO TABS: 1.0000 | ORAL_TABLET | Freq: Two times a day (BID) | ORAL | 0 refills | Status: DC

## 2020-02-25 MED ORDER — FAMOTIDINE 20 MG PO TABS: 20.0000 mg | ORAL_TABLET | Freq: Two times a day (BID) | ORAL | 5 refills | Status: DC

## 2020-02-25 NOTE — Progress Notes (Signed)
Established Patient Office Visit  Subjective:  Patient ID: Douglas Briggs, male    DOB: August 12, 1960  Age: 59 y.o. MRN: [id number]  CC:  Chief Complaint  Patient presents with  . 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  He is also status post recent emergency department visit on 02/18/2020 after onset of nausea and vomiting.  Patient initially denied any issues with urinary incontinence but per his daughter who is with him at today's visit, patient has had episodes of urinary incontinence for the past 3 days.  Patient denies any increased urinary frequency and no burning with urination.  He denies any weakening of the urinary stream and no straining to initiating urinary stream.  Over the past 3 days, he has had to get up at night to urinate.  He denies any back pain and no abdominal pain.  He reports that his stomach feels " nervous".  He denies any constipation or diarrhea.  He denies any blood in the stool.         Patient and daughter reports that patient's blood sugars/diabetes have been well controlled however per daughter, patient's blood sugar has not been checked in the past week.  He denies any increased thirst.  He denies any chest pain or palpitations.  No shortness of breath or cough.  He does report that he feels some generalized weakness.  Weakness had improved status post hospital discharge but he states that earlier today he began to feel weak again.  He denies any sensation of feeling as if he will faint.  He reports compliance with his medications.  He reports that his blood pressure is better than during his emergency department visit.  He denies any loss of appetite.  He reports that he eats well and does not skip meals.  He reports one of his major concerns  is that he tends to sweat a lot in his groin area and the skin is moist and itchy.  Past Medical History:  Diagnosis Date  . Acquired hammer toe of left foot 03/12/2017  . Atherosclerotic vascular disease 11/22/2019  . Cellulitis 12/19/2019  . CKD (chronic kidney disease)   . Diabetes mellitus due to underlying condition with retinopathy of both eyes ([place])   . Dry gangrene ([place]) 12/19/2019  . Essential hypertension 11/22/2019  . HLD (hyperlipidemia)   . Hypertension   . Influenza vaccination declined 01/20/2020  . Male hypogonadism 11/14/2016  . Neuropathy   . Normocytic anemia   . NPDR (nonproliferative diabetic retinopathy) ([place]) 02/09/2020   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 Dr. Tacy Dura - seen 11/24/19  . Restless leg syndrome 11/14/2016  . Right sided numbness   . Stroke ([place])   . Stroke-like symptoms 10/29/2019  . TIA (transient ischemic attack) 10/29/2019  . Tinea pedis of both feet 03/12/2017  . Ulcer of left lower extremity with fat layer exposed ([place]) 01/20/2020  . Uncontrolled type 2 diabetes mellitus with diabetic polyneuropathy, without long-term current use of insulin ([place]) 11/12/2016    Past Surgical History:  Procedure Laterality Date  . AMPUTATION Left 12/20/2019   Procedure: AMPUTATION 5TH TOE;  Surgeon: Erle Crocker, MD;  Location: [place];  Service: Orthopedics;  Laterality: Left;    Family History  Problem Relation Age of Onset  . Hypertension Neg Hx   .  Diabetes Neg Hx   . Heart disease Neg Hx     Social History   Socioeconomic History  . Marital status: Single    Spouse name: Not on file  . Number of children: Not on file  . Years of education: Not on file  . Highest education level: Not on file  Occupational History  . Not on file  Tobacco Use  . Smoking status: Never Smoker  . Smokeless tobacco: Never Used  Vaping Use  . Vaping Use: Never used  Substance and Sexual Activity  . Alcohol use: No  . Drug use: Never  . Sexual activity:  Not on file  Other Topics Concern  . Not on file  Social History Narrative  . Not on file   Social Determinants of Health   Financial Resource Strain:   . Difficulty of Paying Living Expenses: Not on file  Food Insecurity:   . Worried About Charity fundraiser in the Last Year: Not on file  . Ran Out of Food in the Last Year: Not on file  Transportation Needs:   . Lack of Transportation (Medical): Not on file  . Lack of Transportation (Non-Medical): Not on file  Physical Activity:   . Days of Exercise per Week: Not on file  . Minutes of Exercise per Session: Not on file  Stress:   . Feeling of Stress : Not on file  Social Connections:   . Frequency of Communication with Friends and Family: Not on file  . Frequency of Social Gatherings with Friends and Family: Not on file  . Attends Religious Services: Not on file  . Active Member of Clubs or Organizations: Not on file  . Attends Archivist Meetings: Not on file  . Marital Status: Not on file  Intimate Partner Violence:   . Fear of Current or Ex-Partner: Not on file  . Emotionally Abused: Not on file  . Physically Abused: Not on file  . Sexually Abused: Not on file    Outpatient Medications Prior to Visit  Medication Sig Dispense Refill  . amLODipine (NORVASC) 10 MG tablet Take 1 tablet (10 mg total) by mouth daily. 30 tablet 6  . aspirin EC 81 MG tablet Take 1 tablet (81 mg total) by mouth daily. 150 tablet 2  . atorvastatin (LIPITOR) 80 MG tablet Take 1 tablet (80 mg total) by mouth daily. 90 tablet 3  . carvedilol (COREG) 3.125 MG tablet Take 1 tablet (3.125 mg total) by mouth 2 (two) times daily with a meal. 60 tablet 6  . cloNIDine (CATAPRES) 0.1 MG tablet Take 1 tablet (0.1 mg total) by mouth 2 (two) times daily. 60 tablet 11  . glimepiride (AMARYL) 1 MG tablet Take 1 tablet (1 mg total) by mouth daily with breakfast. 30 tablet 6  . hydrochlorothiazide (HYDRODIURIL) 25 MG tablet Take 1 tablet (25 mg total) by  mouth daily. 30 tablet 6  . losartan (COZAAR) 25 MG tablet Take 2 tablets (50 mg total) by mouth daily. 30 tablet 6  . metFORMIN (GLUCOPHAGE) 500 MG tablet Take 1 tablet (500 mg total) by mouth at bedtime. 90 tablet 1  . Multiple Vitamin (MULTIVITAMIN WITH MINERALS) TABS tablet Take 1 tablet by mouth daily with breakfast.    . mupirocin ointment (BACTROBAN) 2 % Apply 1 application topically daily. Apply topically to toe    . ondansetron (ZOFRAN ODT) 4 MG disintegrating tablet Take 1 tablet (4 mg total) by mouth every 8 (eight) hours as needed  for nausea or vomiting. 20 tablet 0  . VITAMIN D PO Take 1 tablet by mouth daily with breakfast.     . JANUVIA 50 MG tablet Take 50 mg by mouth daily. (Patient not taking: Reported on 02/25/2020)    . pregabalin (LYRICA) 25 MG capsule Take 1 capsule (25 mg total) by mouth daily. (Patient not taking: Reported on 02/25/2020) 30 capsule 4   No facility-administered medications prior to visit.    No Known Allergies  ROS Review of Systems  Constitutional: Positive for fatigue. Negative for chills and fever.  HENT: Negative for sore throat and trouble swallowing.   Respiratory: Negative for cough and shortness of breath.   Cardiovascular: Negative for chest pain and palpitations.  Gastrointestinal: Negative for abdominal pain, constipation and diarrhea.       No abdominal pain but sometimes his stomach feels "nervous"  Endocrine: Negative for polydipsia, polyphagia and polyuria.  Genitourinary: Negative for dysuria and frequency.       Patient denied urinary symptoms but per daughter he has had incontinence of urine for the past 3 days  Skin:       c/o skin under the scrotum/inner thighs staying moist and skin is itchy  Neurological: Positive for weakness (sensation of generalized weakness). Negative for dizziness and headaches.  Hematological: Negative for adenopathy. Does not bruise/bleed easily.      Objective:    Physical Exam Vitals and nursing  note reviewed. Chaperone present: accompanied by his adult daughter who left the room during the prosate exam.  Constitutional:      Appearance: Normal appearance.  Cardiovascular:     Rate and Rhythm: Normal rate and regular rhythm.  Pulmonary:     Effort: Pulmonary effort is normal.     Breath sounds: Normal breath sounds.  Abdominal:     General: Abdomen is flat.     Palpations: Abdomen is soft.     Tenderness: There is no abdominal tenderness. There is no right CVA tenderness, left CVA tenderness, guarding or rebound.  Genitourinary:    Prostate: Normal.     Comments: Mild bogginess of the prostate; no tenderness or nodules/masses appreciated; normal size Musculoskeletal:     Right lower leg: No edema.     Left lower leg: No edema.  Skin:    General: Skin is warm and dry.  Neurological:     General: No focal deficit present.     Mental Status: He is oriented to person, place, and time.  Psychiatric:        Mood and Affect: Mood normal.        Behavior: Behavior normal.     BP (!) 145/77   Pulse 71   Temp 98 F (36.7 C) (Oral)   Ht 5\' 8"  (1.727 m)   Wt 168 lb (76.2 kg)   SpO2 100%   BMI 25.54 kg/m  Wt Readings from Last 3 Encounters:  02/25/20 168 lb (76.2 kg)  02/18/20 163 lb (73.9 kg)  02/03/20 162 lb (73.5 kg)     Health Maintenance Due  Topic Date Due  . Hepatitis C Screening  Never done  . COLONOSCOPY  Never done    There are no preventive care reminders to display for this patient.  No results found for: TSH Lab Results  Component Value Date   WBC 5.6 02/18/2020   HGB 10.9 (L) 02/18/2020   HCT 32.8 (L) 02/18/2020   MCV 82.2 02/18/2020   PLT 180 02/18/2020   Lab Results  Component Value Date   NA 140 02/18/2020   K 4.0 02/18/2020   CO2 23 02/18/2020   GLUCOSE 157 (H) 02/18/2020   BUN 19 02/18/2020   CREATININE 1.47 (H) 02/18/2020   BILITOT 0.4 02/18/2020   ALKPHOS 118 02/18/2020   AST 20 02/18/2020   ALT 28 02/18/2020   PROT 5.9 (L)  02/18/2020   ALBUMIN 2.5 (L) 02/18/2020   CALCIUM 8.4 (L) 02/18/2020   ANIONGAP 9 02/18/2020   Lab Results  Component Value Date   CHOL 333 (H) 01/21/2020   Lab Results  Component Value Date   HDL 59 01/21/2020   Lab Results  Component Value Date   LDLCALC 243 (H) 01/21/2020   Lab Results  Component Value Date   TRIG 161 (H) 01/21/2020   Lab Results  Component Value Date   CHOLHDL 5.6 (H) 01/21/2020   Lab Results  Component Value Date   HGBA1C 6.9 (H) 10/30/2019      Assessment & Plan:  1. Urinary incontinence, unspecified type Patient with urinary incontinence.  Urinalysis was done to look for urinary tract infection.  Patient did have some hematuria and urine will be sent for culture.  In case of an uncomplicated UTI, he was prescribed Bactrim DS twice daily x3 days and patient/daughter will be notified if a change is needed in antibiotic therapy based on the urine culture result.  He is also being referred to urology for further evaluation and treatment. - POCT URINALYSIS DIP (CLINITEK) - Urine Culture - Ambulatory referral to Urology - sulfamethoxazole-trimethoprim (BACTRIM DS) 800-160 MG tablet; Take 1 tablet by mouth 2 (two) times daily.  Dispense: 6 tablet; Refill: 0  2. Stage 3a chronic kidney disease (HCC) We will check renal function panel in follow-up of patient's chronic kidney disease - Renal Function Panel  3. Hematuria, unspecified type Patient with hematuria on urinalysis and urine will be sent for culture and patient has been referred to urology for further evaluation - Urine Culture - Ambulatory referral to Urology  4. Type 2 diabetes mellitus with diabetic polyneuropathy, without long-term current use of insulin [facility]) Patient and daughter report that home blood sugars are usually good but have not been checked in the past week. Continue current medications and home monitoring of blood sugars.  - nystatin cream (MYCOSTATIN); Apply 1 application  topically 2 (two) times daily. X 10 days to affected skin areas then use as needed  Dispense: 30 g; Refill: 3  5. Rash Likely due to yeast as patient is diabetic and states that he tends to sweat a lot in this area. Rx provided for Nystatin cream. - nystatin cream (MYCOSTATIN); Apply 1 application topically 2 (two) times daily. X 10 days to affected skin areas then use as needed  Dispense: 30 g; Refill: 3  6. Encounter for examination following treatment at hospital; Essential Hypertension, Protein calorie malnutrition-mild; long term use of aspirin therapy; normocytic anemia Notes from patient's recent ED encounter on 02/18/2020 reviewed. Patient reports that he feels better but has some generalized weakness. Will obtain renal panel in follow-up of his CKD as Cr in the ED was 1.47. He also had low albumin and protein but denies any loss of appetite and states that he eats regularly throughout the day. BP in the ED was 208/101 but at today's visit is near normal. He reports compliance with blood pressure medications. Will add pepcid 20 mg twice per day as patient with recent episode of N/V and is on long term aspirin  therapy and had Hgb of 10.9 on CBC done in the ED and reports that his stomach feels "nervous".     Follow-up: Return in about 3 weeks (around 03/17/2020) for chronic and acute issues with PCP-[facility].   Antony Blackbird, MD

## 2020-02-25 NOTE — Progress Notes (Signed)
States that he urinates on himself at night, he sweat in his private area.  States that ankles are swollen.  States that he is weak,off balance and moving slow.

## 2020-02-26 LAB — RENAL FUNCTION PANEL
Albumin: 2.9 g/dL — ABNORMAL LOW (ref 3.8–4.9)
BUN/Creatinine Ratio: 21 — ABNORMAL HIGH (ref 9–20)
BUN: 33 mg/dL — ABNORMAL HIGH (ref 6–24)
CO2: 24 mmol/L (ref 20–29)
Calcium: 8.4 mg/dL — ABNORMAL LOW (ref 8.7–10.2)
Chloride: 108 mmol/L — ABNORMAL HIGH (ref 96–106)
Creatinine, Ser: 1.55 mg/dL — ABNORMAL HIGH (ref 0.76–1.27)
GFR calc Af Amer: 56 mL/min/1.73 — ABNORMAL LOW
GFR calc non Af Amer: 48 mL/min/1.73 — ABNORMAL LOW
Glucose: 200 mg/dL — ABNORMAL HIGH (ref 65–99)
Phosphorus: 3.7 mg/dL (ref 2.8–4.1)
Potassium: 4.2 mmol/L (ref 3.5–5.2)
Sodium: 140 mmol/L (ref 134–144)

## 2020-02-27 LAB — URINE CULTURE

## 2020-03-01 ENCOUNTER — Encounter: Payer: Self-pay | Admitting: Internal Medicine

## 2020-03-02 ENCOUNTER — Ambulatory Visit: Payer: Self-pay | Admitting: Physician Assistant

## 2020-03-08 ENCOUNTER — Other Ambulatory Visit: Payer: Self-pay

## 2020-03-10 ENCOUNTER — Encounter: Payer: Self-pay | Admitting: Cardiology

## 2020-03-10 ENCOUNTER — Other Ambulatory Visit: Payer: Self-pay

## 2020-03-10 ENCOUNTER — Ambulatory Visit (INDEPENDENT_AMBULATORY_CARE_PROVIDER_SITE_OTHER): Payer: Self-pay | Admitting: Cardiology

## 2020-03-10 VITALS — BP 134/78 | HR 76 | Ht 67.0 in | Wt 169.0 lb

## 2020-03-10 DIAGNOSIS — N189 Chronic kidney disease, unspecified: Secondary | ICD-10-CM

## 2020-03-10 DIAGNOSIS — I7090 Unspecified atherosclerosis: Secondary | ICD-10-CM

## 2020-03-10 DIAGNOSIS — I10 Essential (primary) hypertension: Secondary | ICD-10-CM

## 2020-03-10 DIAGNOSIS — E1165 Type 2 diabetes mellitus with hyperglycemia: Secondary | ICD-10-CM

## 2020-03-10 DIAGNOSIS — IMO0002 Reserved for concepts with insufficient information to code with codable children: Secondary | ICD-10-CM

## 2020-03-10 DIAGNOSIS — E1142 Type 2 diabetes mellitus with diabetic polyneuropathy: Secondary | ICD-10-CM

## 2020-03-10 DIAGNOSIS — E782 Mixed hyperlipidemia: Secondary | ICD-10-CM

## 2020-03-10 NOTE — Patient Instructions (Signed)
Medication Instructions:  No medication changes. *If you need a refill on your cardiac medications before your next appointment, please call your pharmacy*   Lab Work: Your physician recommends that you have a BMET today. Your physician recommends that you return for lab work in: 2 days before your next appointment. You need to have labs done when you are fasting.  You can come Monday through Friday 8:30 am to 12:00 pm and 1:15 to 4:30. You do not need to make an appointment as the order has already been placed. The labs you are going to have done are BMET, TSH, LFT and Lipids.    If you have labs (blood work) drawn today and your tests are completely normal, you will receive your results only by: Marland Kitchen MyChart Message (if you have MyChart) OR . A paper copy in the mail If you have any lab test that is abnormal or we need to change your treatment, we will call you to review the results.   Testing/Procedures: None ordered   Follow-Up: At [facility], you and your health needs are our priority.  As part of our continuing mission to provide you with exceptional heart care, we have created designated Provider Care Teams.  These Care Teams include your primary Cardiologist (physician) and Advanced Practice Providers (APPs -  Physician Assistants and Nurse Practitioners) who all work together to provide you with the care you need, when you need it.  We recommend signing up for the patient portal called "MyChart".  Sign up information is provided on this After Visit Summary.  MyChart is used to connect with patients for Virtual Visits (Telemedicine).  Patients are able to view lab/test results, encounter notes, upcoming appointments, etc.  Non-urgent messages can be sent to your provider as well.   To learn more about what you can do with MyChart, go to [email or web address].    Your next appointment:   1 month(s)  The format for your next appointment:   In Person  Provider:   Jyl Heinz, MD   Other Instructions NA

## 2020-03-10 NOTE — Progress Notes (Signed)
Cardiology Office Note:    Date:  03/10/2020   ID:  Douglas Briggs, DOB 27-Apr-1961, MRN [id number]  PCP:  Ladell Pier, MD  Cardiologist:  Jenean Lindau, MD   Referring MD: Ladell Pier, MD    ASSESSMENT:    1. Atherosclerotic vascular disease   2. Essential hypertension   3. Mixed hyperlipidemia   4. Uncontrolled type 2 diabetes mellitus with diabetic polyneuropathy, without long-term current use of insulin ([place])   5. Chronic kidney disease, unspecified CKD stage    PLAN:    In order of problems listed above:  1. Primary prevention stressed with the patient.  Importance of compliance with diet medication stressed and she vocalized understanding.  Importance of regular exercise stressed and he promises to walk regularly half an hour a day at least 5 days a week 2. Mixed dyslipidemia: Lipids are markedly elevated.  Patient promises to take medicines regularly.  I will have a Chem-7 on him today.  We will bring him back in 1 month for liver lipid check before the next appointment in a month or so. 3. Essential hypertension: Diet was emphasized.  Lifestyle modification was urged and he is doing better with this. 4. Renal insufficiency: Stable he will have a Chem-7 today. 5. Patient will be seen in follow-up appointment in 6 months or earlier if he has any concerns.  He had multiple questions which were answered to his satisfaction.  Prior records were reviewed extensively by me.   Medication Adjustments/Labs and Tests Ordered: Current medicines are reviewed at length with the patient today.  Concerns regarding medicines are outlined above.  No orders of the defined types were placed in this encounter.  No orders of the defined types were placed in this encounter.    No chief complaint on file.    History of Present Illness:    Douglas Briggs is a 59 y.o. male.  Patient has history of essential hypertension, dyslipidemia diabetes mellitus and renal insufficiency.  He  denies any problems at this time and takes care of activities of daily living.  No chest pain orthopnea or PND.  He mentions to me that he did takes his medicines on a regular basis and walking on a regular basis.  He is doing better with his diet.  At the time of my evaluation, the patient is alert awake oriented and in no distress.  Past Medical History:  Diagnosis Date  . Acquired hammer toe of left foot 03/12/2017  . Atherosclerotic vascular disease 11/22/2019  . Cellulitis 12/19/2019  . CKD (chronic kidney disease)   . Diabetes mellitus due to underlying condition with retinopathy of both eyes ([place])   . Dry gangrene ([place]) 12/19/2019  . Essential hypertension 11/22/2019  . HLD (hyperlipidemia)   . Hypertension   . Influenza vaccination declined 01/20/2020  . Male hypogonadism 11/14/2016  . Neuropathy   . Normocytic anemia   . NPDR (nonproliferative diabetic retinopathy) ([place]) 02/09/2020   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 Dr. Tacy Dura - seen 11/24/19  . Restless leg syndrome 11/14/2016  . Right sided numbness   . Stroke ([place])   . Stroke-like symptoms 10/29/2019  . TIA (transient ischemic attack) 10/29/2019  . Tinea pedis of both feet 03/12/2017  . Ulcer of left lower extremity with fat layer exposed ([place]) 01/20/2020  . Uncontrolled type 2 diabetes mellitus with diabetic polyneuropathy, without long-term current use of insulin ([place]) 11/12/2016    Past Surgical History:  Procedure Laterality Date  .  AMPUTATION Left 12/20/2019   Procedure: AMPUTATION 5TH TOE;  Surgeon: Erle Crocker, MD;  Location: [place];  Service: Orthopedics;  Laterality: Left;    Current Medications: Current Meds  Medication Sig  . amLODipine (NORVASC) 10 MG tablet Take 1 tablet (10 mg total) by mouth daily.  Marland Kitchen aspirin EC 81 MG tablet Take 1 tablet (81 mg total) by mouth daily.  Marland Kitchen atorvastatin (LIPITOR) 40 MG tablet Take 40 mg by mouth daily.  . carvedilol (COREG) 3.125 MG tablet Take 1 tablet (3.125 mg  total) by mouth 2 (two) times daily with a meal.  . cloNIDine (CATAPRES) 0.1 MG tablet Take 1 tablet (0.1 mg total) by mouth 2 (two) times daily.  . famotidine (PEPCID) 20 MG tablet Take 1 tablet (20 mg total) by mouth 2 (two) times daily. To reduce stomach acid  . glimepiride (AMARYL) 1 MG tablet Take 1 tablet (1 mg total) by mouth daily with breakfast.  . hydrochlorothiazide (HYDRODIURIL) 25 MG tablet Take 1 tablet (25 mg total) by mouth daily.  Marland Kitchen JANUVIA 50 MG tablet Take 50 mg by mouth daily.   Marland Kitchen losartan (COZAAR) 25 MG tablet Take 2 tablets (50 mg total) by mouth daily.  . metFORMIN (GLUCOPHAGE) 500 MG tablet Take 1 tablet (500 mg total) by mouth at bedtime.  . Multiple Vitamin (MULTIVITAMIN WITH MINERALS) TABS tablet Take 1 tablet by mouth daily with breakfast.  . mupirocin ointment (BACTROBAN) 2 % Apply 1 application topically daily. Apply topically to toe  . nystatin cream (MYCOSTATIN) Apply 1 application topically 2 (two) times daily. X 10 days to affected skin areas then use as needed  . ondansetron (ZOFRAN ODT) 4 MG disintegrating tablet Take 1 tablet (4 mg total) by mouth every 8 (eight) hours as needed for nausea or vomiting.  . pregabalin (LYRICA) 25 MG capsule Take 1 capsule (25 mg total) by mouth daily.  Marland Kitchen sulfamethoxazole-trimethoprim (BACTRIM DS) 800-160 MG tablet Take 1 tablet by mouth 2 (two) times daily.  Marland Kitchen VITAMIN D PO Take 1 tablet by mouth daily with breakfast.      Allergies:   Patient has no known allergies.   Social History   Socioeconomic History  . Marital status: Single    Spouse name: Not on file  . Number of children: Not on file  . Years of education: Not on file  . Highest education level: Not on file  Occupational History  . Not on file  Tobacco Use  . Smoking status: Never Smoker  . Smokeless tobacco: Never Used  Vaping Use  . Vaping Use: Never used  Substance and Sexual Activity  . Alcohol use: No  . Drug use: Never  . Sexual activity: Not on  file  Other Topics Concern  . Not on file  Social History Narrative  . Not on file   Social Determinants of Health   Financial Resource Strain:   . Difficulty of Paying Living Expenses: Not on file  Food Insecurity:   . Worried About Charity fundraiser in the Last Year: Not on file  . Ran Out of Food in the Last Year: Not on file  Transportation Needs:   . Lack of Transportation (Medical): Not on file  . Lack of Transportation (Non-Medical): Not on file  Physical Activity:   . Days of Exercise per Week: Not on file  . Minutes of Exercise per Session: Not on file  Stress:   . Feeling of Stress : Not on file  Social  Connections:   . Frequency of Communication with Friends and Family: Not on file  . Frequency of Social Gatherings with Friends and Family: Not on file  . Attends Religious Services: Not on file  . Active Member of Clubs or Organizations: Not on file  . Attends Archivist Meetings: Not on file  . Marital Status: Not on file     Family History: The patient's family history is negative for Hypertension, Diabetes, and Heart disease.  ROS:   Please see the history of present illness.    All other systems reviewed and are negative.  EKGs/Labs/Other Studies Reviewed:    The following studies were reviewed today: I discussed my findings with the patient especially lipids   Recent Labs: 12/22/2019: Magnesium 2.2 02/18/2020: ALT 28; Hemoglobin 10.9; Platelets 180 02/25/2020: BUN 33; Creatinine, Ser 1.55; Potassium 4.2; Sodium 140  Recent Lipid Panel    Component Value Date/Time   CHOL 333 (H) 01/21/2020 0925   TRIG 161 (H) 01/21/2020 0925   HDL 59 01/21/2020 0925   CHOLHDL 5.6 (H) 01/21/2020 0925   CHOLHDL 4.4 12/21/2019 0123   VLDL 23 12/21/2019 0123   LDLCALC 243 (H) 01/21/2020 0925    Physical Exam:    VS:  BP 134/78   Pulse 76   Ht 5\' 7"  (1.702 m)   Wt 169 lb 0.6 oz (76.7 kg)   SpO2 99%   BMI 26.48 kg/m     Wt Readings from Last 3  Encounters:  03/10/20 169 lb 0.6 oz (76.7 kg)  02/25/20 168 lb (76.2 kg)  02/18/20 163 lb (73.9 kg)     GEN: Patient is in no acute distress HEENT: Normal NECK: No JVD; No carotid bruits LYMPHATICS: No lymphadenopathy CARDIAC: Hear sounds regular, 2/6 systolic murmur at the apex. RESPIRATORY:  Clear to auscultation without rales, wheezing or rhonchi  ABDOMEN: Soft, non-tender, non-distended MUSCULOSKELETAL:  No edema; No deformity  SKIN: Warm and dry NEUROLOGIC:  Alert and oriented x 3 PSYCHIATRIC:  Normal affect   Signed, Jenean Lindau, MD  03/10/2020 10:41 AM    [place]

## 2020-03-11 LAB — BASIC METABOLIC PANEL
BUN/Creatinine Ratio: 18 (ref 9–20)
BUN: 30 mg/dL — ABNORMAL HIGH (ref 6–24)
CO2: 21 mmol/L (ref 20–29)
Calcium: 9 mg/dL (ref 8.7–10.2)
Chloride: 107 mmol/L — ABNORMAL HIGH (ref 96–106)
Creatinine, Ser: 1.65 mg/dL — ABNORMAL HIGH (ref 0.76–1.27)
GFR calc Af Amer: 52 mL/min/{1.73_m2} — ABNORMAL LOW (ref >59)
GFR calc non Af Amer: 45 mL/min/{1.73_m2} — ABNORMAL LOW (ref >59)
Glucose: 110 mg/dL — ABNORMAL HIGH (ref 65–99)
Potassium: 5 mmol/L (ref 3.5–5.2)
Sodium: 140 mmol/L (ref 134–144)

## 2020-03-21 ENCOUNTER — Ambulatory Visit: Payer: No Typology Code available for payment source | Admitting: Internal Medicine

## 2020-04-06 LAB — HEPATIC FUNCTION PANEL
ALT: 33 IU/L (ref 0–44)
AST: 19 IU/L (ref 0–40)
Albumin: 3.2 g/dL — ABNORMAL LOW (ref 3.8–4.9)
Alkaline Phosphatase: 153 IU/L — ABNORMAL HIGH (ref 44–121)
Bilirubin Total: 0.2 mg/dL (ref 0.0–1.2)
Bilirubin, Direct: <0.1 mg/dL (ref 0.00–0.40)
Total Protein: 6 g/dL (ref 6.0–8.5)

## 2020-04-06 LAB — LIPID PANEL
Chol/HDL Ratio: 3.8 ratio (ref 0.0–5.0)
Cholesterol, Total: 203 mg/dL — ABNORMAL HIGH (ref 100–199)
HDL: 54 mg/dL (ref >39)
LDL Chol Calc (NIH): 128 mg/dL — ABNORMAL HIGH (ref 0–99)
Triglycerides: 116 mg/dL (ref 0–149)
VLDL Cholesterol Cal: 21 mg/dL (ref 5–40)

## 2020-04-06 MED ORDER — EZETIMIBE 10 MG PO TABS: 10.0000 mg | ORAL_TABLET | Freq: Every day | ORAL | 3 refills | Status: DC

## 2020-04-06 NOTE — Addendum Note (Signed)
Addended by: Truddie Hidden on: 04/06/2020 03:07 PM   Modules accepted: Orders

## 2020-04-07 ENCOUNTER — Other Ambulatory Visit: Payer: Self-pay

## 2020-04-07 ENCOUNTER — Encounter: Payer: Self-pay | Admitting: Cardiology

## 2020-04-07 ENCOUNTER — Ambulatory Visit (INDEPENDENT_AMBULATORY_CARE_PROVIDER_SITE_OTHER): Payer: PRIVATE HEALTH INSURANCE | Admitting: Cardiology

## 2020-04-07 VITALS — BP 176/82 | HR 66 | Ht 67.5 in | Wt 169.0 lb

## 2020-04-07 DIAGNOSIS — N189 Chronic kidney disease, unspecified: Secondary | ICD-10-CM | POA: Diagnosis not present

## 2020-04-07 DIAGNOSIS — I7090 Unspecified atherosclerosis: Secondary | ICD-10-CM | POA: Diagnosis not present

## 2020-04-07 DIAGNOSIS — I10 Essential (primary) hypertension: Secondary | ICD-10-CM | POA: Diagnosis not present

## 2020-04-07 MED ORDER — LOSARTAN POTASSIUM 100 MG PO TABS: 100.0000 mg | ORAL_TABLET | Freq: Every day | ORAL | 6 refills | Status: DC

## 2020-04-07 NOTE — Patient Instructions (Addendum)
Medication Instructions:  Your physician has recommended you make the following change in your medication:   Stop amlodipine and double losartan to 100 mg daily.  *If you need a refill on your cardiac medications before your next appointment, please call your pharmacy*   Lab Work: None ordered If you have labs (blood work) drawn today and your tests are completely normal, you will receive your results only by:  [place] (if you have MyChart) OR  A paper copy in the mail If you have any lab test that is abnormal or we need to change your treatment, we will call you to review the results.   Testing/Procedures: None ordered   Follow-Up: At [facility], you and your health needs are our priority.  As part of our continuing mission to provide you with exceptional heart care, we have created designated Provider Care Teams.  These Care Teams include your primary Cardiologist (physician) and Advanced Practice Providers (APPs -  Physician Assistants and Nurse Practitioners) who all work together to provide you with the care you need, when you need it.  We recommend signing up for the patient portal called "MyChart".  Sign up information is provided on this After Visit Summary.  MyChart is used to connect with patients for Virtual Visits (Telemedicine).  Patients are able to view lab/test results, encounter notes, upcoming appointments, etc.  Non-urgent messages can be sent to your provider as well.   To learn more about what you can do with MyChart, go to [email or web address].    Your next appointment:   1 week(s)  The format for your next appointment:   In Person  Provider:   Jyl Heinz, MD   Other Instructions  Blood Pressure Record Sheet To take your blood pressure, you will need a blood pressure machine. You can buy a blood pressure machine (blood pressure monitor) at your clinic, drug store, or online. When choosing one, consider:  An automatic monitor that has  an arm cuff.  A cuff that wraps snugly around your upper arm. You should be able to fit only one finger between your arm and the cuff.  A device that stores blood pressure reading results.  Do not choose a monitor that measures your blood pressure from your wrist or finger. Follow your health care provider's instructions for how to take your blood pressure. To use this form:  Get one reading in the morning (a.m.) 2 hours after you take any medicines.  Get one reading in the evening (p.m.) before supper.  Take at least 2 readings with each blood pressure check. This makes sure the results are correct. Wait 1-2 minutes between measurements.  Write down the results in the spaces on this form.  Repeat this once a week, or as told by your health care provider.  Make a follow-up appointment with your health care provider to discuss the results. Blood pressure log Date: _______________________  a.m. _____________________(1st reading) _____________________(2nd reading)  p.m. _____________________(1st reading) _____________________(2nd reading) Date: _______________________  a.m. _____________________(1st reading) _____________________(2nd reading)  p.m. _____________________(1st reading) _____________________(2nd reading) Date: _______________________  a.m. _____________________(1st reading) _____________________(2nd reading)  p.m. _____________________(1st reading) _____________________(2nd reading) Date: _______________________  a.m. _____________________(1st reading) _____________________(2nd reading)  p.m. _____________________(1st reading) _____________________(2nd reading) Date: _______________________  a.m. _____________________(1st reading) _____________________(2nd reading)  p.m. _____________________(1st reading) _____________________(2nd reading) This information is not intended to replace advice given to you by your health care provider. Make sure you discuss any  questions you have with your health care provider. Document Revised: 07/11/2017  Document Reviewed: 05/13/2017 Elsevier Patient Education  [place] and Cholesterol Restricted Eating Plan Getting too much fat and cholesterol in your diet may cause health problems. Choosing the right foods helps keep your fat and cholesterol at normal levels. This can keep you from getting certain diseases. Your doctor may recommend an eating plan that includes:  Total fat: ______% or less of total calories a day.  Saturated fat: ______% or less of total calories a day.  Cholesterol: less than _________mg a day.  Fiber: ______g a day. What are tips for following this plan? Meal planning  At meals, divide your plate into four equal parts: ? Fill one-half of your plate with vegetables and green salads. ? Fill one-fourth of your plate with whole grains. ? Fill one-fourth of your plate with low-fat (lean) protein foods.  Eat fish that is high in omega-3 fats at least two times a week. This includes mackerel, tuna, sardines, and salmon.  Eat foods that are high in fiber, such as whole grains, beans, apples, broccoli, carrots, peas, and barley. General tips   Work with your doctor to lose weight if you need to.  Avoid: ? Foods with added sugar. ? Fried foods. ? Foods with partially hydrogenated oils.  Limit alcohol intake to no more than 1 drink a day for nonpregnant women and 2 drinks a day for men. One drink equals 12 oz of beer, 5 oz of wine, or 1 oz of hard liquor. Reading food labels  Check food labels for: ? Trans fats. ? Partially hydrogenated oils. ? Saturated fat (g) in each serving. ? Cholesterol (mg) in each serving. ? Fiber (g) in each serving.  Choose foods with healthy fats, such as: ? Monounsaturated fats. ? Polyunsaturated fats. ? Omega-3 fats.  Choose grain products that have whole grains. Look for the word "whole" as the first word in the ingredient  list. Cooking  Cook foods using low-fat methods. These include baking, boiling, grilling, and broiling.  Eat more home-cooked foods. Eat at restaurants and buffets less often.  Avoid cooking using saturated fats, such as butter, cream, palm oil, palm kernel oil, and coconut oil. Recommended foods  Fruits  All fresh, canned (in natural juice), or frozen fruits. Vegetables  Fresh or frozen vegetables (raw, steamed, roasted, or grilled). Green salads. Grains  Whole grains, such as whole wheat or whole grain breads, crackers, cereals, and pasta. Unsweetened oatmeal, bulgur, barley, quinoa, or brown rice. Corn or whole wheat flour tortillas. Meats and other protein foods  Ground beef (85% or leaner), grass-fed beef, or beef trimmed of fat. Skinless chicken or [place]. Ground chicken or [place]. Pork trimmed of fat. All fish and seafood. Egg whites. Dried beans, peas, or lentils. Unsalted nuts or seeds. Unsalted canned beans. Nut butters without added sugar or oil. Dairy  Low-fat or nonfat dairy products, such as skim or 1% milk, 2% or reduced-fat cheeses, low-fat and fat-free ricotta or cottage cheese, or plain low-fat and nonfat yogurt. Fats and oils  Tub margarine without trans fats. Light or reduced-fat mayonnaise and salad dressings. Avocado. Olive, canola, sesame, or safflower oils. The items listed above may not be a complete list of foods and beverages you can eat. Contact a dietitian for more information. Foods to avoid Fruits  Canned fruit in heavy syrup. Fruit in cream or butter sauce. Fried fruit. Vegetables  Vegetables cooked in cheese, cream, or butter sauce. Fried vegetables. Grains  White bread. White pasta. White rice. Cornbread.  Bagels, pastries, and croissants. Crackers and snack foods that contain trans fat and hydrogenated oils. Meats and other protein foods  Fatty cuts of meat. Ribs, chicken wings, bacon, sausage, bologna, salami, chitterlings, fatback, hot  dogs, bratwurst, and packaged lunch meats. Liver and organ meats. Whole eggs and egg yolks. Chicken and [place] with skin. Fried meat. Dairy  Whole or 2% milk, cream, half-and-half, and cream cheese. Whole milk cheeses. Whole-fat or sweetened yogurt. Full-fat cheeses. Nondairy creamers and whipped toppings. Processed cheese, cheese spreads, and cheese curds. Beverages  Alcohol. Sugar-sweetened drinks such as sodas, lemonade, and fruit drinks. Fats and oils  Butter, stick margarine, lard, shortening, ghee, or bacon fat. Coconut, palm kernel, and palm oils. Sweets and desserts  Corn syrup, sugars, honey, and molasses. Candy. Jam and jelly. Syrup. Sweetened cereals. Cookies, pies, cakes, donuts, muffins, and ice cream. The items listed above may not be a complete list of foods and beverages you should avoid. Contact a dietitian for more information. Summary  Choosing the right foods helps keep your fat and cholesterol at normal levels. This can keep you from getting certain diseases.  At meals, fill one-half of your plate with vegetables and green salads.  Eat high-fiber foods, like whole grains, beans, apples, carrots, peas, and barley.  Limit added sugar, saturated fats, alcohol, and fried foods. This information is not intended to replace advice given to you by your health care provider. Make sure you discuss any questions you have with your health care provider. Document Revised: 01/14/2018 Document Reviewed: 01/28/2017 Elsevier Patient Education  [place].

## 2020-04-07 NOTE — Progress Notes (Signed)
Cardiology Office Note:    Date:  04/07/2020   ID:  Douglas Briggs, DOB Jul 16, 1960, MRN [id number]  PCP:  Ladell Pier, MD  Cardiologist:  Jenean Lindau, MD   Referring MD: Ladell Pier, MD    ASSESSMENT:    1. Atherosclerotic vascular disease   2. Essential hypertension   3. Chronic kidney disease, unspecified CKD stage    PLAN:    In order of problems listed above:  1. Primary prevention stressed with the patient.  Importance of compliance with diet medication stressed and he vocalized understanding.  He is exercise protocol was reviewed and I told him to walk at least half an hour a day 5 days a week. 2. Essential hypertension: He has not taken his blood pressure medications today.  His blood pressure is elevated.  He also complains of bilateral pedal edema.  In view of this I will stop his amlodipine.  I have doubled his losartan he will be back in a week to see me in follow-up and at that time I will also do a Chem-7. 3. Mixed dyslipidemia.  Diet was emphasized.  Lipids were reviewed.  4. Diabetes mellitus: Managed by primary care physician.  Diet was emphasized. 5. Patient had multiple questions which were answered to his satisfaction.  Salt intake issues were discussed and we have hypertension.  He promises to comply.   Medication Adjustments/Labs and Tests Ordered: Current medicines are reviewed at length with the patient today.  Concerns regarding medicines are outlined above.  No orders of the defined types were placed in this encounter.  Meds ordered this encounter  Medications  . losartan (COZAAR) 100 MG tablet    Sig: Take 1 tablet (100 mg total) by mouth daily.    Dispense:  30 tablet    Refill:  6     No chief complaint on file.    History of Present Illness:    Douglas Briggs is a 59 y.o. male.  Patient has past medical history of essential hypertension, dyslipidemia and diabetes mellitus.  He has renal insufficiency.  He denies any problems at  this time and takes care of activities of daily living.  No chest pain orthopnea or PND.  At the time of my evaluation, the patient is alert awake oriented and in no distress.  Past Medical History:  Diagnosis Date  . Acquired hammer toe of left foot 03/12/2017  . Atherosclerotic vascular disease 11/22/2019  . Cellulitis 12/19/2019  . CKD (chronic kidney disease)   . Diabetes mellitus due to underlying condition with retinopathy of both eyes ([place])   . 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 Essential hypertension 11/22/2019  . HLD (hyperlipidemia)   . Hypertension   . Influenza vaccination declined 01/20/2020  . Male hypogonadism 11/14/2016  . Neuropathy   . Normocytic anemia   . NPDR (nonproliferative diabetic retinopathy) ([place]) 02/09/2020   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 Dr. Tacy Dura - seen 11/24/19  . Restless leg syndrome 11/14/2016  . Right sided numbness   . Stroke ([place])   . Stroke-like symptoms 10/29/2019  . TIA (transient ischemic attack) 10/29/2019  . Tinea pedis of both feet 03/12/2017  . Ulcer of left lower extremity with fat layer exposed ([place]) 01/20/2020  . Uncontrolled type 2 diabetes mellitus with diabetic polyneuropathy, without long-term current use of insulin ([place]) 11/12/2016    Past Surgical History:  Procedure Laterality Date  . AMPUTATION Left 12/20/2019   Procedure: AMPUTATION 5TH TOE;  Surgeon: Lucia Gaskins,  Lisette Grinder, MD;  Location: [facility];  Service: Orthopedics;  Laterality: Left;    Current Medications: Current Meds  Medication Sig  . aspirin EC 81 MG tablet Take 1 tablet (81 mg total) by mouth daily.  Marland Kitchen atorvastatin (LIPITOR) 40 MG tablet Take 80 mg by mouth daily.  . carvedilol (COREG) 3.125 MG tablet Take 1 tablet (3.125 mg total) by mouth 2 (two) times daily with a meal.  . cloNIDine (CATAPRES) 0.1 MG tablet Take 1 tablet (0.1 mg total) by mouth 2 (two) times daily.  Marland Kitchen ezetimibe (ZETIA) 10 MG tablet Take 1 tablet (10 mg total) by mouth daily.  . famotidine  (PEPCID) 20 MG tablet Take 1 tablet (20 mg total) by mouth 2 (two) times daily. To reduce stomach acid  . glimepiride (AMARYL) 1 MG tablet Take 1 tablet (1 mg total) by mouth daily with breakfast.  . hydrochlorothiazide (HYDRODIURIL) 25 MG tablet Take 1 tablet (25 mg total) by mouth daily.  Marland Kitchen JANUVIA 50 MG tablet Take 50 mg by mouth daily.   Marland Kitchen losartan (COZAAR) 100 MG tablet Take 1 tablet (100 mg total) by mouth daily.  . metFORMIN (GLUCOPHAGE) 500 MG tablet Take 1 tablet (500 mg total) by mouth at bedtime.  . Multiple Vitamin (MULTIVITAMIN WITH MINERALS) TABS tablet Take 1 tablet by mouth daily with breakfast.  . mupirocin ointment (BACTROBAN) 2 % Apply 1 application topically daily. Apply topically to toe  . nystatin cream (MYCOSTATIN) Apply 1 application topically 2 (two) times daily. X 10 days to affected skin areas then use as needed  . ondansetron (ZOFRAN ODT) 4 MG disintegrating tablet Take 1 tablet (4 mg total) by mouth every 8 (eight) hours as needed for nausea or vomiting.  . pregabalin (LYRICA) 25 MG capsule Take 1 capsule (25 mg total) by mouth daily.  Marland Kitchen sulfamethoxazole-trimethoprim (BACTRIM DS) 800-160 MG tablet Take 1 tablet by mouth 2 (two) times daily.  Marland Kitchen VITAMIN D PO Take 1 tablet by mouth daily with breakfast.   . [DISCONTINUED] amLODipine (NORVASC) 10 MG tablet Take 1 tablet (10 mg total) by mouth daily.  . [DISCONTINUED] losartan (COZAAR) 25 MG tablet Take 2 tablets (50 mg total) by mouth daily.     Allergies:   Patient has no known allergies.   Social History   Socioeconomic History  . Marital status: Single    Spouse name: Not on file  . Number of children: Not on file  . Years of education: Not on file  . Highest education level: Not on file  Occupational History  . Not on file  Tobacco Use  . Smoking status: Never Smoker  . Smokeless tobacco: Never Used  Vaping Use  . Vaping Use: Never used  Substance and Sexual Activity  . Alcohol use: No  . Drug use:  Never  . Sexual activity: Not on file  Other Topics Concern  . Not on file  Social History Narrative  . Not on file   Social Determinants of Health   Financial Resource Strain:   . Difficulty of Paying Living Expenses: Not on file  Food Insecurity:   . Worried About Charity fundraiser in the Last Year: Not on file  . Ran Out of Food in the Last Year: Not on file  Transportation Needs:   . Lack of Transportation (Medical): Not on file  . Lack of Transportation (Non-Medical): Not on file  Physical Activity:   . Days of Exercise per Week: Not on file  .  Minutes of Exercise per Session: Not on file  Stress:   . Feeling of Stress : Not on file  Social Connections:   . Frequency of Communication with Friends and Family: Not on file  . Frequency of Social Gatherings with Friends and Family: Not on file  . Attends Religious Services: Not on file  . Active Member of Clubs or Organizations: Not on file  . Attends Archivist Meetings: Not on file  . Marital Status: Not on file     Family History: The patient's family history is negative for Hypertension, Diabetes, and Heart disease.  ROS:   Please see the history of present illness.    All other systems reviewed and are negative.  EKGs/Labs/Other Studies Reviewed:    The following studies were reviewed today: I discussed my findings with the patient at length   Recent Labs: 12/22/2019: Magnesium 2.2 02/18/2020: Hemoglobin 10.9; Platelets 180 03/10/2020: BUN 30; Creatinine, Ser 1.65; Potassium 5.0; Sodium 140 04/05/2020: ALT 33  Recent Lipid Panel    Component Value Date/Time   CHOL 203 (H) 04/05/2020 1019   TRIG 116 04/05/2020 1019   HDL 54 04/05/2020 1019   CHOLHDL 3.8 04/05/2020 1019   CHOLHDL 4.4 12/21/2019 0123   VLDL 23 12/21/2019 0123   LDLCALC 128 (H) 04/05/2020 1019    Physical Exam:    VS:  BP (!) 176/82   Pulse 66   Ht 5' 7.5" (1.715 m)   Wt 169 lb 0.6 oz (76.7 kg)   SpO2 99%   BMI 26.08  kg/m     Wt Readings from Last 3 Encounters:  04/07/20 169 lb 0.6 oz (76.7 kg)  03/10/20 169 lb 0.6 oz (76.7 kg)  02/25/20 168 lb (76.2 kg)     GEN: Patient is in no acute distress HEENT: Normal NECK: No JVD; No carotid bruits LYMPHATICS: No lymphadenopathy CARDIAC: Hear sounds regular, 2/6 systolic murmur at the apex. RESPIRATORY:  Clear to auscultation without rales, wheezing or rhonchi  ABDOMEN: Soft, non-tender, non-distended MUSCULOSKELETAL:  No edema; No deformity  SKIN: Warm and dry NEUROLOGIC:  Alert and oriented x 3 PSYCHIATRIC:  Normal affect   Signed, Jenean Lindau, MD  04/07/2020 11:51 AM    [place]

## 2020-04-14 ENCOUNTER — Telehealth: Payer: Self-pay

## 2020-04-14 ENCOUNTER — Ambulatory Visit (INDEPENDENT_AMBULATORY_CARE_PROVIDER_SITE_OTHER): Payer: Self-pay | Admitting: Cardiology

## 2020-04-14 ENCOUNTER — Other Ambulatory Visit: Payer: Self-pay

## 2020-04-14 ENCOUNTER — Ambulatory Visit: Payer: Self-pay | Admitting: Cardiology

## 2020-04-14 ENCOUNTER — Encounter: Payer: Self-pay | Admitting: Cardiology

## 2020-04-14 VITALS — BP 140/76 | HR 68 | Ht 67.5 in | Wt 171.2 lb

## 2020-04-14 DIAGNOSIS — E782 Mixed hyperlipidemia: Secondary | ICD-10-CM

## 2020-04-14 DIAGNOSIS — I7090 Unspecified atherosclerosis: Secondary | ICD-10-CM

## 2020-04-14 DIAGNOSIS — I10 Essential (primary) hypertension: Secondary | ICD-10-CM

## 2020-04-14 NOTE — Patient Instructions (Addendum)
Medication Instructions:  No medication changes. *If you need a refill on your cardiac medications before your next appointment, please call your pharmacy*   Lab Work: You had a BMET done in the office today. If you have labs (blood work) drawn today and your tests are completely normal, you will receive your results only by: [place] MyChart Message (if you have MyChart) OR . A paper copy in the mail If you have any lab test that is abnormal or we need to change your treatment, we will call you to review the results.   Testing/Procedures: None ordered   Follow-Up: At [facility], you and your health needs are our priority.  As part of our continuing mission to provide you with exceptional heart care, we have created designated Provider Care Teams.  These Care Teams include your primary Cardiologist (physician) and Advanced Practice Providers (APPs -  Physician Assistants and Nurse Practitioners) who all work together to provide you with the care you need, when you need it.  We recommend signing up for the patient portal called "MyChart".  Sign up information is provided on this After Visit Summary.  MyChart is used to connect with patients for Virtual Visits (Telemedicine).  Patients are able to view lab/test results, encounter notes, upcoming appointments, etc.  Non-urgent messages can be sent to your provider as well.   To learn more about what you can do with MyChart, go to [email or web address].    Your next appointment:   6 weeks  The format for your next appointment:   In Person  Provider:   Jyl Heinz, MD   Other Instructions NA

## 2020-04-14 NOTE — Progress Notes (Signed)
Cardiology Office Note:    Date:  04/14/2020   ID:  Douglas Briggs, DOB 1961/03/12, MRN [id number]  PCP:  Ladell Pier, MD  Cardiologist:  Jenean Lindau, MD   Referring MD: Ladell Pier, MD    ASSESSMENT:    1. Atherosclerotic vascular disease   2. Essential hypertension   3. Mixed hyperlipidemia    PLAN:    In order of problems listed above:  1. Primary prevention stressed with the patient.  Importance of compliance with diet medication stressed and vocalized understanding. 2. Essential hypertension: Lifestyle modification was again emphasized.  He is doing very well with exercise and I congratulated him about this.  His blood pressure stable.:  Medication changes he will have a Chem-7 today. 3. Mixed dyslipidemia: Diet was emphasized and lipids were reviewed 4. Diabetes mellitus: Patient managed by primary care physician for this and diet and exercise stressed. 5. Renal insufficiency: Managed by primary care physician and appears stable at this time. 6. Patient will be seen in follow-up appointment in 6 weeks or earlier if the patient has any concerns    Medication Adjustments/Labs and Tests Ordered: Current medicines are reviewed at length with the patient today.  Concerns regarding medicines are outlined above.  No orders of the defined types were placed in this encounter.  No orders of the defined types were placed in this encounter.    No chief complaint on file.    History of Present Illness:    Douglas Briggs is a 59 y.o. male.  Patient has past medical history of essential hypertension dyslipidemia diabetes mellitus and renal insufficiency.  He denies any problems at this time and takes care of activities of daily living.  Last time he was seen here he was on amlodipine and has pedal edema.  I switched his medications and he is pedal edema is gone significantly better and is happy with it.  He is using an exercise bicycle at least half an hour on a  regular basis.  At the time of my evaluation, the patient is alert awake oriented and in no distress.  Past Medical History:  Diagnosis Date  . Acquired hammer toe of left foot 03/12/2017  . Atherosclerotic vascular disease 11/22/2019  . Cellulitis 12/19/2019  . CKD (chronic kidney disease)   . Diabetes mellitus due to underlying condition with retinopathy of both eyes ([place])   . Dry gangrene ([place]) 12/19/2019  . Essential hypertension 11/22/2019  . HLD (hyperlipidemia)   . Hypertension   . Influenza vaccination declined 01/20/2020  . Male hypogonadism 11/14/2016  . Neuropathy   . Normocytic anemia   . NPDR (nonproliferative diabetic retinopathy) ([place]) 02/09/2020   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 Dr. Tacy Dura - seen 11/24/19  . Restless leg syndrome 11/14/2016  . Right sided numbness   . Stroke ([place])   . Stroke-like symptoms 10/29/2019  . TIA (transient ischemic attack) 10/29/2019  . Tinea pedis of both feet 03/12/2017  . Ulcer of left lower extremity with fat layer exposed ([place]) 01/20/2020  . Uncontrolled type 2 diabetes mellitus with diabetic polyneuropathy, without long-term current use of insulin ([place]) 11/12/2016    Past Surgical History:  Procedure Laterality Date  . AMPUTATION Left 12/20/2019   Procedure: AMPUTATION 5TH TOE;  Surgeon: Erle Crocker, MD;  Location: [facility];  Service: Orthopedics;  Laterality: Left;    Current Medications: Current Meds  Medication Sig  . aspirin EC 81 MG tablet Take 1 tablet (81 mg total)  by mouth daily.  Marland Kitchen atorvastatin (LIPITOR) 40 MG tablet Take 80 mg by mouth daily.  . carvedilol (COREG) 3.125 MG tablet Take 1 tablet (3.125 mg total) by mouth 2 (two) times daily with a meal.  . cloNIDine (CATAPRES) 0.1 MG tablet Take 1 tablet (0.1 mg total) by mouth 2 (two) times daily.  Marland Kitchen ezetimibe (ZETIA) 10 MG tablet Take 1 tablet (10 mg total) by mouth daily.  . famotidine (PEPCID) 20 MG tablet Take 1 tablet (20 mg total) by mouth 2 (two) times  daily. To reduce stomach acid  . glimepiride (AMARYL) 1 MG tablet Take 1 tablet (1 mg total) by mouth daily with breakfast.  . hydrochlorothiazide (HYDRODIURIL) 25 MG tablet Take 1 tablet (25 mg total) by mouth daily.  Marland Kitchen JANUVIA 50 MG tablet Take 50 mg by mouth daily.   Marland Kitchen losartan (COZAAR) 100 MG tablet Take 1 tablet (100 mg total) by mouth daily.  . metFORMIN (GLUCOPHAGE) 500 MG tablet Take 1 tablet (500 mg total) by mouth at bedtime.  . Multiple Vitamin (MULTIVITAMIN WITH MINERALS) TABS tablet Take 1 tablet by mouth daily with breakfast.  . mupirocin ointment (BACTROBAN) 2 % Apply 1 application topically daily. Apply topically to toe  . nystatin cream (MYCOSTATIN) Apply 1 application topically 2 (two) times daily. X 10 days to affected skin areas then use as needed  . ondansetron (ZOFRAN ODT) 4 MG disintegrating tablet Take 1 tablet (4 mg total) by mouth every 8 (eight) hours as needed for nausea or vomiting.  . pregabalin (LYRICA) 25 MG capsule Take 1 capsule (25 mg total) by mouth daily.  Marland Kitchen sulfamethoxazole-trimethoprim (BACTRIM DS) 800-160 MG tablet Take 1 tablet by mouth 2 (two) times daily.  Marland Kitchen VITAMIN D PO Take 1 tablet by mouth daily with breakfast.      Allergies:   Patient has no known allergies.   Social History   Socioeconomic History  . Marital status: Single    Spouse name: Not on file  . Number of children: Not on file  . Years of education: Not on file  . Highest education level: Not on file  Occupational History  . Not on file  Tobacco Use  . Smoking status: Never Smoker  . Smokeless tobacco: Never Used  Vaping Use  . Vaping Use: Never used  Substance and Sexual Activity  . Alcohol use: No  . Drug use: Never  . Sexual activity: Not on file  Other Topics Concern  . Not on file  Social History Narrative  . Not on file   Social Determinants of Health   Financial Resource Strain:   . Difficulty of Paying Living Expenses: Not on file  Food Insecurity:   .  Worried About Charity fundraiser in the Last Year: Not on file  . Ran Out of Food in the Last Year: Not on file  Transportation Needs:   . Lack of Transportation (Medical): Not on file  . Lack of Transportation (Non-Medical): Not on file  Physical Activity:   . Days of Exercise per Week: Not on file  . Minutes of Exercise per Session: Not on file  Stress:   . Feeling of Stress : Not on file  Social Connections:   . Frequency of Communication with Friends and Family: Not on file  . Frequency of Social Gatherings with Friends and Family: Not on file  . Attends Religious Services: Not on file  . Active Member of Clubs or Organizations: Not on file  .  Attends Archivist Meetings: Not on file  . Marital Status: Not on file     Family History: The patient's family history is negative for Hypertension, Diabetes, and Heart disease.  ROS:   Please see the history of present illness.    All other systems reviewed and are negative.  EKGs/Labs/Other Studies Reviewed:    The following studies were reviewed today: I discussed my findings with the patient at extensive length   Recent Labs: 12/22/2019: Magnesium 2.2 02/18/2020: Hemoglobin 10.9; Platelets 180 03/10/2020: BUN 30; Creatinine, Ser 1.65; Potassium 5.0; Sodium 140 04/05/2020: ALT 33  Recent Lipid Panel    Component Value Date/Time   CHOL 203 (H) 04/05/2020 1019   TRIG 116 04/05/2020 1019   HDL 54 04/05/2020 1019   CHOLHDL 3.8 04/05/2020 1019   CHOLHDL 4.4 12/21/2019 0123   VLDL 23 12/21/2019 0123   LDLCALC 128 (H) 04/05/2020 1019    Physical Exam:    VS:  BP 140/76   Pulse 68   Ht 5' 7.5" (1.715 m)   Wt 171 lb 3.2 oz (77.7 kg)   SpO2 98%   BMI 26.42 kg/m     Wt Readings from Last 3 Encounters:  04/14/20 171 lb 3.2 oz (77.7 kg)  04/07/20 169 lb 0.6 oz (76.7 kg)  03/10/20 169 lb 0.6 oz (76.7 kg)     GEN: Patient is in no acute distress HEENT: Normal NECK: No JVD; No carotid bruits LYMPHATICS: No  lymphadenopathy CARDIAC: Hear sounds regular, 2/6 systolic murmur at the apex. RESPIRATORY:  Clear to auscultation without rales, wheezing or rhonchi  ABDOMEN: Soft, non-tender, non-distended MUSCULOSKELETAL:  No edema; No deformity  SKIN: Warm and dry NEUROLOGIC:  Alert and oriented x 3 PSYCHIATRIC:  Normal affect   Signed, Jenean Lindau, MD  04/14/2020 3:19 PM    [facility]

## 2020-04-14 NOTE — Telephone Encounter (Signed)
Called patient to check on him as he missed his appointment today in [place].

## 2020-04-15 LAB — BASIC METABOLIC PANEL
BUN/Creatinine Ratio: 17 (ref 9–20)
BUN: 26 mg/dL — ABNORMAL HIGH (ref 6–24)
CO2: 25 mmol/L (ref 20–29)
Calcium: 9.2 mg/dL (ref 8.7–10.2)
Chloride: 107 mmol/L — ABNORMAL HIGH (ref 96–106)
Creatinine, Ser: 1.56 mg/dL — ABNORMAL HIGH (ref 0.76–1.27)
GFR calc Af Amer: 55 mL/min/{1.73_m2} — ABNORMAL LOW (ref >59)
GFR calc non Af Amer: 48 mL/min/{1.73_m2} — ABNORMAL LOW (ref >59)
Glucose: 96 mg/dL (ref 65–99)
Potassium: 4.9 mmol/L (ref 3.5–5.2)
Sodium: 141 mmol/L (ref 134–144)

## 2020-05-11 ENCOUNTER — Encounter: Payer: Self-pay | Admitting: Internal Medicine

## 2020-05-11 ENCOUNTER — Other Ambulatory Visit: Payer: Self-pay

## 2020-05-11 ENCOUNTER — Ambulatory Visit: Payer: PRIVATE HEALTH INSURANCE | Attending: Internal Medicine | Admitting: Internal Medicine

## 2020-05-11 VITALS — BP 180/95 | HR 77 | Temp 98.1°F | Resp 16 | Wt 171.8 lb

## 2020-05-11 DIAGNOSIS — E1142 Type 2 diabetes mellitus with diabetic polyneuropathy: Secondary | ICD-10-CM

## 2020-05-11 DIAGNOSIS — I10 Essential (primary) hypertension: Secondary | ICD-10-CM

## 2020-05-11 DIAGNOSIS — E663 Overweight: Secondary | ICD-10-CM

## 2020-05-11 DIAGNOSIS — N183 Chronic kidney disease, stage 3 unspecified: Secondary | ICD-10-CM

## 2020-05-11 DIAGNOSIS — Z2821 Immunization not carried out because of patient refusal: Secondary | ICD-10-CM

## 2020-05-11 LAB — POCT GLYCOSYLATED HEMOGLOBIN (HGB A1C): HbA1c, POC (controlled diabetic range): 7.1 % — AB (ref 0.0–7.0)

## 2020-05-11 LAB — GLUCOSE, POCT (MANUAL RESULT ENTRY): POC Glucose: 120 mg/dl — AB (ref 70–99)

## 2020-05-11 NOTE — Progress Notes (Signed)
Patient ID: Douglas Briggs, male    DOB: Oct 18, 1960  MRN: [id number]  CC: Follow-up (7 week )   Subjective: Douglas Briggs is a 59 y.o. male who presents for chronic disease management. 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  Gives range of 116-120. Doing good with his eating habits. He purchased a bike and rides the bike daily for a few hours for exercise.  Weight is up 8 pounds since September of this year. Compliance with Amaryl, Januvia and Metformin.  HTN: He has a home blood pressure device.  Checks blood pressure a few times a week.  Recent reading of 146/108.  Blood pressure today very elevated.  He states that he was rushing this morning and forgot to take all of his meds.  Also reports eating some [place] food for lunch with soy sauce and he thinks this may have elevated his blood pressure more than usual today. -Otherwise he reports compliance with his medications including amlodipine, hydrochlorothiazide, carvedilol and Cozaar. Denies any chest pain, shortness of breath, lower extremity edema.   HM: Reports having had colonoscopy about 2 years ago at [facility] in [place].  I did locate this in Care Everywhere; done 02/20/2017.  No polyps were found, positive sigmoid diverticulosis and grade 2 internal hemorrhoids noted.  Recommended for repeat colonoscopy in 10 years.  He declines flu shot. Patient Active Problem List   Diagnosis Date Noted   Diabetes mellitus due to underlying condition with retinopathy of both eyes ([place])    Hypertension    Neuropathy    NPDR (nonproliferative  diabetic retinopathy) ([place]) 02/09/2020   Ulcer of left lower extremity with fat layer exposed ([place]) 01/20/2020   Influenza vaccination declined 01/20/2020   Cellulitis 12/19/2019   Dry gangrene ([place]) 12/19/2019   HLD (hyperlipidemia)    Normocytic anemia    CKD (chronic kidney disease)    Essential hypertension 11/22/2019   Atherosclerotic vascular disease 11/22/2019   Stroke ([place]) 10/30/2019   TIA (transient ischemic attack) 10/29/2019   Stroke-like symptoms 10/29/2019   Right sided numbness    Acquired hammer toe of left foot 03/12/2017   Tinea pedis of both feet 03/12/2017   Male hypogonadism 11/14/2016   Restless leg syndrome 11/14/2016   Uncontrolled type 2 diabetes mellitus with diabetic polyneuropathy, without long-term current use of insulin ([place]) 11/12/2016     Current Outpatient Medications on File Prior to Visit  Medication Sig Dispense Refill   aspirin EC 81 MG tablet Take 1 tablet (81 mg total) by mouth daily. 150 tablet 2   atorvastatin (LIPITOR) 40 MG tablet Take 80 mg by mouth daily.     carvedilol (COREG) 3.125 MG tablet Take 1 tablet (3.125 mg total) by mouth 2 (two) times daily with a meal. 60 tablet 6   cloNIDine (CATAPRES) 0.1 MG tablet Take 1 tablet (0.1 mg total) by mouth 2 (two) times daily. 60 tablet 11   ezetimibe (ZETIA) 10 MG tablet Take 1 tablet (10 mg total) by mouth daily. 90 tablet 3   famotidine (PEPCID) 20 MG tablet Take 1 tablet (20 mg total)  by mouth 2 (two) times daily. To reduce stomach acid 60 tablet 5   glimepiride (AMARYL) 1 MG tablet Take 1 tablet (1 mg total) by mouth daily with breakfast. 30 tablet 6   hydrochlorothiazide (HYDRODIURIL) 25 MG tablet Take 1 tablet (25 mg total) by mouth daily. 30 tablet 6   JANUVIA 50 MG tablet Take 50 mg by mouth daily.      losartan (COZAAR) 100 MG tablet Take 1 tablet (100 mg total) by mouth daily. 30 tablet 6   metFORMIN (GLUCOPHAGE) 500 MG tablet Take 1 tablet (500 mg total)  by mouth at bedtime. 90 tablet 1   Multiple Vitamin (MULTIVITAMIN WITH MINERALS) TABS tablet Take 1 tablet by mouth daily with breakfast.     mupirocin ointment (BACTROBAN) 2 % Apply 1 application topically daily. Apply topically to toe     nystatin cream (MYCOSTATIN) Apply 1 application topically 2 (two) times daily. X 10 days to affected skin areas then use as needed 30 g 3   ondansetron (ZOFRAN ODT) 4 MG disintegrating tablet Take 1 tablet (4 mg total) by mouth every 8 (eight) hours as needed for nausea or vomiting. 20 tablet 0   pregabalin (LYRICA) 25 MG capsule Take 1 capsule (25 mg total) by mouth daily. 30 capsule 4   VITAMIN D PO Take 1 tablet by mouth daily with breakfast.      No current facility-administered medications on file prior to visit.    No Known Allergies  Social History   Socioeconomic History   Marital status: Single    Spouse name: Not on file   Number of children: Not on file   Years of education: Not on file   Highest education level: Not on file  Occupational History   Not on file  Tobacco Use   Smoking status: Never Smoker   Smokeless tobacco: Never Used  Vaping Use   Vaping Use: Never used  Substance and Sexual Activity   Alcohol use: No   Drug use: Never   Sexual activity: Not on file  Other Topics Concern   Not on file  Social History Narrative   Not on file   Social Determinants of Health   Financial Resource Strain: Not on file  Food Insecurity: Not on file  Transportation Needs: Not on file  Physical Activity: Not on file  Stress: Not on file  Social Connections: Not on file  Intimate Partner Violence: Not on file    Family History  Problem Relation Age of Onset   Hypertension Neg Hx    Diabetes Neg Hx    Heart disease Neg Hx     Past Surgical History:  Procedure Laterality Date   AMPUTATION Left 12/20/2019   Procedure: AMPUTATION 5TH TOE;  Surgeon: Erle Crocker, MD;  Location: [facility];  Service:  Orthopedics;  Laterality: Left;    ROS: Review of Systems Negative except as stated above  PHYSICAL EXAM: BP (!) 180/95    Pulse 77    Temp 98.1 F (36.7 C)    Resp 16    Wt 171 lb 12.8 oz (77.9 kg)    SpO2 99%    BMI 26.51 kg/m   Wt Readings from Last 3 Encounters:  05/11/20 171 lb 12.8 oz (77.9 kg)  04/14/20 171 lb 3.2 oz (77.7 kg)  04/07/20 169 lb 0.6 oz (76.7 kg)    Physical Exam  General appearance - alert, well appearing, and in no distress Mental status - normal mood, behavior, speech,  dress, motor activity, and thought processes Neck - supple, no significant adenopathy Chest - clear to auscultation, no wheezes, rales or rhonchi, symmetric air entry Heart - normal rate, regular rhythm, normal S1, S2, no murmurs, rubs, clicks or gallops Extremities - peripheral pulses normal, no pedal edema, no clubbing or cyanosis  Results for orders placed or performed in visit on 05/11/20  POCT glucose (manual entry)  Result Value Ref Range   POC Glucose 120 (A) 70 - 99 mg/dl  POCT glycosylated hemoglobin (Hb A1C)  Result Value Ref Range   Hemoglobin A1C     HbA1c POC (<> result, manual entry)     HbA1c, POC (prediabetic range)     HbA1c, POC (controlled diabetic range) 7.1 (A) 0.0 - 7.0 %    CMP Latest Ref Rng & Units 04/14/2020 04/05/2020 03/10/2020  Glucose 65 - 99 mg/dL 96 - 110(H)  BUN 6 - 24 mg/dL 26(H) - 30(H)  Creatinine 0.76 - 1.27 mg/dL 1.56(H) - 1.65(H)  Sodium 134 - 144 mmol/L 141 - 140  Potassium 3.5 - 5.2 mmol/L 4.9 - 5.0  Chloride 96 - 106 mmol/L 107(H) - 107(H)  CO2 20 - 29 mmol/L 25 - 21  Calcium 8.7 - 10.2 mg/dL 9.2 - 9.0  Total Protein 6.0 - 8.5 g/dL - 6.0 -  Total Bilirubin 0.0 - 1.2 mg/dL - 0.2 -  Alkaline Phos 44 - 121 IU/L - 153(H) -  AST 0 - 40 IU/L - 19 -  ALT 0 - 44 IU/L - 33 -   Lipid Panel     Component Value Date/Time   CHOL 203 (H) 04/05/2020 1019   TRIG 116 04/05/2020 1019   HDL 54 04/05/2020 1019   CHOLHDL 3.8 04/05/2020 1019    CHOLHDL 4.4 12/21/2019 0123   VLDL 23 12/21/2019 0123   LDLCALC 128 (H) 04/05/2020 1019    CBC    Component Value Date/Time   WBC 5.6 02/18/2020 1355   RBC 3.99 (L) 02/18/2020 1355   HGB 10.9 (L) 02/18/2020 1355   HCT 32.8 (L) 02/18/2020 1355   PLT 180 02/18/2020 1355   MCV 82.2 02/18/2020 1355   MCH 27.3 02/18/2020 1355   MCHC 33.2 02/18/2020 1355   RDW 13.8 02/18/2020 1355   LYMPHSABS 1.3 12/22/2019 0424   MONOABS 0.5 12/22/2019 0424   EOSABS 0.2 12/22/2019 0424   BASOSABS 0.0 12/22/2019 0424    ASSESSMENT AND PLAN: 1. Type 2 diabetes mellitus with diabetic polyneuropathy, without long-term current use of insulin (HCC) To goal.  Continue oral medications, healthy eating habits and regular exercise. - POCT glucose (manual entry) - POCT glycosylated hemoglobin (Hb A1C)  2. Essential hypertension Not at goal.  Patient has not taken his medicines today.  Encouraged him to take his medicines daily as prescribed.  DASH diet discussed and encouraged.  Return in 2 weeks with the clinical pharmacist to have blood pressure rechecked.  3. Over weight Patient is slightly overweight.  Encouraged him not to gain any more weight.  Healthy eating habits encouraged.  He will continue his regular exercise  4. Stage 3 chronic kidney disease, unspecified whether stage 3a or 3b CKD ([place]) Stable on review of his recent chemistries.  5. Influenza vaccination declined Recommended and advised.  Patient declined.   Patient was given the opportunity to ask questions.  Patient verbalized understanding of the plan and was able to repeat key elements of the plan.   Orders Placed This Encounter  Procedures   POCT glucose (  manual entry)   POCT glycosylated hemoglobin (Hb A1C)     Requested Prescriptions    No prescriptions requested or ordered in this encounter    Return in about 4 months (around 09/09/2020) for Give appt with [facility] in 2 wks for BP recheck.  Karle Plumber, MD, FACP

## 2020-05-25 ENCOUNTER — Ambulatory Visit: Payer: PRIVATE HEALTH INSURANCE | Admitting: Pharmacist

## 2020-06-12 ENCOUNTER — Telehealth: Payer: Self-pay

## 2020-06-13 ENCOUNTER — Ambulatory Visit: Payer: PRIVATE HEALTH INSURANCE | Admitting: Cardiology

## 2020-06-21 ENCOUNTER — Ambulatory Visit (INDEPENDENT_AMBULATORY_CARE_PROVIDER_SITE_OTHER): Payer: PRIVATE HEALTH INSURANCE | Admitting: Cardiology

## 2020-06-21 ENCOUNTER — Encounter: Payer: Self-pay | Admitting: Cardiology

## 2020-06-21 ENCOUNTER — Other Ambulatory Visit: Payer: Self-pay

## 2020-06-21 VITALS — BP 152/86 | HR 70 | Ht 68.0 in | Wt 166.1 lb

## 2020-06-21 DIAGNOSIS — I10 Essential (primary) hypertension: Secondary | ICD-10-CM

## 2020-06-21 DIAGNOSIS — E1142 Type 2 diabetes mellitus with diabetic polyneuropathy: Secondary | ICD-10-CM

## 2020-06-21 DIAGNOSIS — IMO0002 Reserved for concepts with insufficient information to code with codable children: Secondary | ICD-10-CM

## 2020-06-21 DIAGNOSIS — I7090 Unspecified atherosclerosis: Secondary | ICD-10-CM

## 2020-06-21 DIAGNOSIS — G459 Transient cerebral ischemic attack, unspecified: Secondary | ICD-10-CM

## 2020-06-21 DIAGNOSIS — E782 Mixed hyperlipidemia: Secondary | ICD-10-CM

## 2020-06-21 DIAGNOSIS — E1165 Type 2 diabetes mellitus with hyperglycemia: Secondary | ICD-10-CM

## 2020-06-21 NOTE — Patient Instructions (Addendum)
Medication Instructions:  No medication changes. *If you need a refill on your cardiac medications before your next appointment, please call your pharmacy*   Lab Work: Your physician recommends that you return for lab work in: before your next visit (08/2020). You need to have labs done when you are fasting.  You can come Monday through Friday 8:30 am to 12:00 pm and 1:15 to 4:30. You do not need to make an appointment as the order has already been placed. The labs you are going to have done are BMET, LFT and Lipids.  If you have labs (blood work) drawn today and your tests are completely normal, you will receive your results only by: Marland Kitchen MyChart Message (if you have MyChart) OR . A paper copy in the mail If you have any lab test that is abnormal or we need to change your treatment, we will call you to review the results.   Testing/Procedures: None ordered   Follow-Up: At [facility], you and your health needs are our priority.  As part of our continuing mission to provide you with exceptional heart care, we have created designated Provider Care Teams.  These Care Teams include your primary Cardiologist (physician) and Advanced Practice Providers (APPs -  Physician Assistants and Nurse Practitioners) who all work together to provide you with the care you need, when you need it.  We recommend signing up for the patient portal called "MyChart".  Sign up information is provided on this After Visit Summary.  MyChart is used to connect with patients for Virtual Visits (Telemedicine).  Patients are able to view lab/test results, encounter notes, upcoming appointments, etc.  Non-urgent messages can be sent to your provider as well.   To learn more about what you can do with MyChart, go to [email or web address].    Your next appointment:   3 month(s)  The format for your next appointment:   In Person  Provider:   Jyl Heinz, MD   Other Instructions NA

## 2020-06-21 NOTE — Progress Notes (Signed)
Cardiology Office Note:    Date:  06/21/2020   ID:  Douglas Briggs, DOB 29-Jan-1961, MRN [id number]  PCP:  Ladell Pier, MD  Cardiologist:  Jenean Lindau, MD   Referring MD: Ladell Pier, MD    ASSESSMENT:    1. Atherosclerotic vascular disease   2. Essential hypertension   3. TIA (transient ischemic attack)   4. Uncontrolled type 2 diabetes mellitus with diabetic polyneuropathy, without long-term current use of insulin ([place])   5. Mixed hyperlipidemia    PLAN:    In order of problems listed above:  1. Primary prevention stressed with the patient.  Importance of compliance with diet medication stressed any vocalized understanding.  He was advised to walk at least half an hour a day 5 days a week and he will try to do so. 2. Essential hypertension: Blood pressure is elevated but better.  I will double his carvedilol and he will keep a track of his blood pressures and send them to [place] in a week.  Lifestyle modification, salt intake issues and diet was urged and he promises to do better 3. Mixed dyslipidemia: Diet was emphasized.  I am not sure whether he is following a good diet and therefore will refer him to a dietitian for low salt low-cholesterol diet in view of his numbers.  I had a lengthy discussion with him about dietary choices of food. 4. Diabetes mellitus: Managed by primary care physician and diet emphasized 5. Renal insufficiency: Stable and managed by primary care physician and evaluated him about the precautions. 6. Patient will be seen in follow-up appointment in 6 months or earlier if the patient has any concerns    Medication Adjustments/Labs and Tests Ordered: Current medicines are reviewed at length with the patient today.  Concerns regarding medicines are outlined above.  Orders Placed This Encounter  Procedures  . Basic metabolic panel  . Hepatic function panel  . Lipid panel  . Amb ref to Medical Nutrition Therapy-MNT   No orders of the defined  types were placed in this encounter.    No chief complaint on file.    History of Present Illness:    Douglas Briggs is a 60 y.o. male.  Patient has past medical history of essential hypertension, dyslipidemia and diabetes mellitus.  He has renal insufficiency.  He denies any problems at this time and takes care of activities of daily living.  He is trying to walk on a regular basis.  At the time of my evaluation, the patient is alert awake oriented and in no distress.  Past Medical History:  Diagnosis Date  . Acquired hammer toe of left foot 03/12/2017  . Atherosclerotic vascular disease 11/22/2019  . Cellulitis 12/19/2019  . CKD (chronic kidney disease)   . Diabetes mellitus due to underlying condition with retinopathy of both eyes ([place])   . Dry gangrene ([place]) 12/19/2019  . Essential hypertension 11/22/2019  . HLD (hyperlipidemia)   . Hypertension   . Influenza vaccination declined 01/20/2020  . Male hypogonadism 11/14/2016  . Neuropathy   . Normocytic anemia   . NPDR (nonproliferative diabetic retinopathy) ([place]) 02/09/2020   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 Dr. Tacy Dura - seen 11/24/19  . Restless leg syndrome 11/14/2016  . Right sided numbness   . Stroke ([place])   . Stroke-like symptoms 10/29/2019  . TIA (transient ischemic attack) 10/29/2019  . Tinea pedis of both feet 03/12/2017  . Ulcer of left lower extremity with fat layer exposed ([place])  01/20/2020  . Uncontrolled type 2 diabetes mellitus with diabetic polyneuropathy, without long-term current use of insulin ([place]) 11/12/2016    Past Surgical History:  Procedure Laterality Date  . AMPUTATION Left 12/20/2019   Procedure: AMPUTATION 5TH TOE;  Surgeon: Erle Crocker, MD;  Location: [facility];  Service: Orthopedics;  Laterality: Left;    Current Medications: Current Meds  Medication Sig  . aspirin EC 81 MG tablet Take 1 tablet (81 mg total) by mouth daily.  Marland Kitchen atorvastatin (LIPITOR) 40 MG tablet Take 80 mg by mouth daily.   . carvedilol (COREG) 3.125 MG tablet Take 1 tablet (3.125 mg total) by mouth 2 (two) times daily with a meal.  . cloNIDine (CATAPRES) 0.1 MG tablet Take 1 tablet (0.1 mg total) by mouth 2 (two) times daily.  Marland Kitchen ezetimibe (ZETIA) 10 MG tablet Take 1 tablet (10 mg total) by mouth daily.  . famotidine (PEPCID) 20 MG tablet Take 1 tablet (20 mg total) by mouth 2 (two) times daily. To reduce stomach acid  . glimepiride (AMARYL) 1 MG tablet Take 1 tablet (1 mg total) by mouth daily with breakfast.  . hydrochlorothiazide (HYDRODIURIL) 25 MG tablet Take 1 tablet (25 mg total) by mouth daily.  Marland Kitchen JANUVIA 50 MG tablet Take 50 mg by mouth daily.   Marland Kitchen losartan (COZAAR) 100 MG tablet Take 1 tablet (100 mg total) by mouth daily.  . metFORMIN (GLUCOPHAGE) 500 MG tablet Take 1 tablet (500 mg total) by mouth at bedtime.  . Multiple Vitamin (MULTIVITAMIN WITH MINERALS) TABS tablet Take 1 tablet by mouth daily with breakfast.  . mupirocin ointment (BACTROBAN) 2 % Apply 1 application topically daily. Apply topically to toe  . nystatin cream (MYCOSTATIN) Apply 1 application topically 2 (two) times daily. X 10 days to affected skin areas then use as needed  . ondansetron (ZOFRAN ODT) 4 MG disintegrating tablet Take 1 tablet (4 mg total) by mouth every 8 (eight) hours as needed for nausea or vomiting.  . pregabalin (LYRICA) 25 MG capsule Take 1 capsule (25 mg total) by mouth daily.  Marland Kitchen VITAMIN D PO Take 1 tablet by mouth daily with breakfast.      Allergies:   Patient has no known allergies.   Social History   Socioeconomic History  . Marital status: Single    Spouse name: Not on file  . Number of children: Not on file  . Years of education: Not on file  . Highest education level: Not on file  Occupational History  . Not on file  Tobacco Use  . Smoking status: Never Smoker  . Smokeless tobacco: Never Used  Vaping Use  . Vaping Use: Never used  Substance and Sexual Activity  . Alcohol use: No  . Drug use:  Never  . Sexual activity: Not on file  Other Topics Concern  . Not on file  Social History Narrative  . Not on file   Social Determinants of Health   Financial Resource Strain: Not on file  Food Insecurity: Not on file  Transportation Needs: Not on file  Physical Activity: Not on file  Stress: Not on file  Social Connections: Not on file     Family History: The patient's family history includes Cancer in his father; Diabetes in his paternal aunt; Hypertension in his father. There is no history of Heart disease.  ROS:   Please see the history of present illness.    All other systems reviewed and are negative.  EKGs/Labs/Other Studies Reviewed:  The following studies were reviewed today: I discussed my findings with the patient at length   Recent Labs: 12/22/2019: Magnesium 2.2 02/18/2020: Hemoglobin 10.9; Platelets 180 04/05/2020: ALT 33 04/14/2020: BUN 26; Creatinine, Ser 1.56; Potassium 4.9; Sodium 141  Recent Lipid Panel    Component Value Date/Time   CHOL 203 (H) 04/05/2020 1019   TRIG 116 04/05/2020 1019   HDL 54 04/05/2020 1019   CHOLHDL 3.8 04/05/2020 1019   CHOLHDL 4.4 12/21/2019 0123   VLDL 23 12/21/2019 0123   LDLCALC 128 (H) 04/05/2020 1019    Physical Exam:    VS:  BP (!) 152/86   Pulse 70   Ht '5\' 8"'$  (1.727 m)   Wt 166 lb 1.3 oz (75.3 kg)   SpO2 99%   BMI 25.25 kg/m     Wt Readings from Last 3 Encounters:  06/21/20 166 lb 1.3 oz (75.3 kg)  05/11/20 171 lb 12.8 oz (77.9 kg)  04/14/20 171 lb 3.2 oz (77.7 kg)     GEN: Patient is in no acute distress HEENT: Normal NECK: No JVD; No carotid bruits LYMPHATICS: No lymphadenopathy CARDIAC: Hear sounds regular, 2/6 systolic murmur at the apex. RESPIRATORY:  Clear to auscultation without rales, wheezing or rhonchi  ABDOMEN: Soft, non-tender, non-distended MUSCULOSKELETAL:  No edema; No deformity  SKIN: Warm and dry NEUROLOGIC:  Alert and oriented x 3 PSYCHIATRIC:  Normal affect    Signed, Jenean Lindau, MD  06/21/2020 2:23 PM    [place]

## 2020-08-09 ENCOUNTER — Ambulatory Visit: Payer: PRIVATE HEALTH INSURANCE | Admitting: Student

## 2020-08-22 ENCOUNTER — Ambulatory Visit: Payer: Medicaid Other | Admitting: Student

## 2020-08-23 NOTE — Progress Notes (Deleted)
ELECTROPHYSIOLOGY CONSULT NOTE  Patient ID: Douglas Briggs MRN: [id number], DOB/AGE: 1960/10/20   Admit date: (Not on file) Date of Consult: 08/23/2020  Primary Physician: Ladell Pier, MD Primary Cardiologist: No primary care provider on file.  Primary Electrophysiologist: New Reason for Consultation: Cryptogenic stroke; recommendations regarding Implantable Loop Recorder Insurance: [place] Medicaid  History of Present Illness Douglas Briggs is a 60 y.o. male of HTN, HLD, DM2, renal insufficiency, and TIA.  Pt was admitted 6/2021with chief complaint of R sided numbness, blurred vision lightheadedness, word finding difficulty, and weakness of the BLE intermittently.   Imaging demonstrated multiple bilateral infarcts, embolic, source unclear.    He underwent workup for stroke that showed:   CT head - No acute intracranial abnormality.   MRI head - 10 mm late subacute infarct within the right frontal white matter. Several additional subcentimeter acute/early subacute infarcts within the right frontal operculum, left temporoparietal periventricular white matter, right frontoparietal white matter and within the midline cerebellum as described. Background mild chronic small vessel ischemic changes within the cerebral white matter. Chronic thalamic and right cerebellar lacunar infarcts.   CTA H&N - Severe left cavernous and supraclinoid ICA stenosis. Advanced left P2 segment stenosis. 50% right V1 segment stenosis.   Bilateral Lower Extremity Venous Dopplers - no DVT bilaterally  2D Echo - LVEF 50-55%, no source of thrombus  [employer] Virus 2 - negative  LDL - 230  HgbA1c - 6.9  UDS - negative  He was recommended for outpatient TEE and loop but was lost to follow up in that regard.  He has been referred to EP for consideration of same by his primary cardiologist. Per the notes, no arrhythmias were noted during patients admission.  Echocardiogram as above. Lab work is  reviewed.  ***  Past Medical History:  Diagnosis Date  . Acquired hammer toe of left foot 03/12/2017  . Atherosclerotic vascular disease 11/22/2019  . Cellulitis 12/19/2019  . CKD (chronic kidney disease)   . Diabetes mellitus due to underlying condition with retinopathy of both eyes ([place])   . Dry gangrene ([place]) 12/19/2019  . Essential hypertension 11/22/2019  . HLD (hyperlipidemia)   . Hypertension   . Influenza vaccination declined 01/20/2020  . Male hypogonadism 11/14/2016  . Neuropathy   . Normocytic anemia   . NPDR (nonproliferative diabetic retinopathy) ([place]) 02/09/2020   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 Dr. Tacy Dura - seen 11/24/19  . Restless leg syndrome 11/14/2016  . Right sided numbness   . Stroke ([place])   . Stroke-like symptoms 10/29/2019  . TIA (transient ischemic attack) 10/29/2019  . Tinea pedis of both feet 03/12/2017  . Ulcer of left lower extremity with fat layer exposed ([place]) 01/20/2020  . Uncontrolled type 2 diabetes mellitus with diabetic polyneuropathy, without long-term current use of insulin ([place]) 11/12/2016     Surgical History:  Past Surgical History:  Procedure Laterality Date  . AMPUTATION Left 12/20/2019   Procedure: AMPUTATION 5TH TOE;  Surgeon: Erle Crocker, MD;  Location: [facility];  Service: Orthopedics;  Laterality: Left;     (Not in a hospital admission)   Inpatient Medications:   Allergies: No Known Allergies  Social History   Socioeconomic History  . Marital status: Single    Spouse name: Not on file  . Number of children: Not on file  . Years of education: Not on file  . Highest education level: Not on file  Occupational History  . Not on file  Tobacco Use  .  Smoking status: Never Smoker  . Smokeless tobacco: Never Used  Vaping Use  . Vaping Use: Never used  Substance and Sexual Activity  . Alcohol use: No  . Drug use: Never  . Sexual activity: Not on file  Other Topics Concern  . Not on file  Social History  Narrative  . Not on file   Social Determinants of Health   Financial Resource Strain: Not on file  Food Insecurity: Not on file  Transportation Needs: Not on file  Physical Activity: Not on file  Stress: Not on file  Social Connections: Not on file  Intimate Partner Violence: Not on file     Family History  Problem Relation Age of Onset  . Hypertension Father   . Cancer Father   . Diabetes Paternal Aunt   . Heart disease Neg Hx       Review of Systems: All other systems reviewed and are otherwise negative except as noted above.  Physical Exam: There were no vitals filed for this visit.  GEN- The patient is well appearing, alert and oriented x 3 today.   Head- normocephalic, atraumatic Eyes-  Sclera clear, conjunctiva pink Ears- hearing intact Oropharynx- clear Neck- supple Lungs- Clear to ausculation bilaterally, normal work of breathing Heart- Regular rate and rhythm, no murmurs, rubs or gallops  GI- soft, NT, ND, + BS Extremities- no clubbing, cyanosis, or edema MS- no significant deformity or atrophy Skin- no rash or lesion Psych- euthymic mood, full affect   Labs:   Lab Results  Component Value Date   WBC 5.6 02/18/2020   HGB 10.9 (L) 02/18/2020   HCT 32.8 (L) 02/18/2020   MCV 82.2 02/18/2020   PLT 180 02/18/2020   No results for input(s): NA, K, CL, CO2, BUN, CREATININE, CALCIUM, PROT, BILITOT, ALKPHOS, ALT, AST, GLUCOSE in the last 168 hours.  Invalid input(s): LABALBU   Radiology/Studies: No results found.  12-lead ECG *** (personally reviewed) All prior EKG's in EPIC reviewed with no documented atrial fibrillation  Telemetry *** (personally reviewed)  Assessment and Plan:  1. Cryptogenic stroke The patient presents with cryptogenic stroke.  The patient {Blank single:19197::"does","does not"} have a TEE planned for this AM.  I spoke at length with the patient about monitoring for afib with an implantable loop recorder.  Risks, benefits, and  alteratives to implantable loop recorder were discussed with the patient today.   At this time, {Blank single:19197::"the patient is very clear in their decision to proceed with implantable loop recorder.","the patient refuses loop consideration, and has opted to wear an event monitor","the patient is not a loop candidate, and has opted to wear an event monitor","refuses monitoring","***"}   {Blank single:19197::"Wound care was reviewed with the patient (keep incision clean and dry for 3 days).  Wound check scheduled and entered in AVS. Please call with questions.","Please call with questions."}   *** If yes, alert cath lab to add to MD schedule ***  [name], PA-C 08/23/2020 9:12 PM

## 2020-08-24 ENCOUNTER — Ambulatory Visit: Payer: Medicaid Other | Admitting: Student

## 2020-09-06 ENCOUNTER — Other Ambulatory Visit: Payer: Self-pay

## 2020-09-06 ENCOUNTER — Ambulatory Visit: Payer: Medicaid Other | Admitting: Student

## 2020-09-06 ENCOUNTER — Encounter: Payer: Self-pay | Admitting: Student

## 2020-09-06 VITALS — BP 150/80 | HR 54 | Ht 67.5 in | Wt 159.0 lb

## 2020-09-06 DIAGNOSIS — G459 Transient cerebral ischemic attack, unspecified: Secondary | ICD-10-CM | POA: Diagnosis not present

## 2020-09-06 DIAGNOSIS — I639 Cerebral infarction, unspecified: Secondary | ICD-10-CM

## 2020-09-06 NOTE — Progress Notes (Signed)
ELECTROPHYSIOLOGY CONSULT NOTE  Patient ID: Douglas Briggs MRN: [id number], DOB/AGE: [birth year]   Admit date: (Not on file) Date of Consult: 09/06/2020  Primary Physician: Ladell Pier, MD Primary Cardiologist: No primary care provider on file.  Primary Electrophysiologist: New to EP / Case has been discussed with Dr. Curt Bears Reason for Consultation: Cryptogenic stroke; recommendations regarding Implantable Loop Recorder Insurance: [place] Medicaid  History of Present Illness EP has been asked to evaluate Douglas Briggs for placement of an implantable loop recorder to monitor for atrial fibrillation by Dr Leonie Man.  The patient was admitted on 10/2019 and underwent work up, but was lost to follow up in regards to TEE/LOOP as outpatient.   Work up for stroke included:    CT head - No acute intracranial abnormality.   MRI head - 10 mm late subacute infarct within the right frontal white matter. Several additional subcentimeter acute/early subacute infarcts within the right frontal operculum, left temporoparietal periventricular white matter, right frontoparietal white matter and within the midline cerebellum as described. Background mild chronic small vessel ischemic changes within the cerebral white matter. Chronic thalamic and right cerebellar lacunar infarcts.   CTA H&N - Severe left cavernous and supraclinoid ICA stenosis. Advanced left P2 segment stenosis. 50% right V1 segment stenosis.   Bilateral Lower Extremity Venous Dopplers - no DVT bilaterally  LE venous doppler - incidental finding of left SFA occlusion with distal recon (Has been seen by Dr. Oneida Alar)  Echo 10/30/2019 LVEF 50-55%  There was no mention of arrhythmias in patients inpatient notes. Complete work up would ideally require a TEE per Neurology.   Echocardiogram as above. Lab work is reviewed.  The patient is doing very well today. He has no functional deficits left from his stroke, though he does have intermittent memory  issues that he attributes to this. He denies symptoms of palpitations, chest pain, shortness of breath, orthopnea, PND, lower extremity edema, claudication, dizziness, presyncope, syncope, bleeding, or neurologic sequela. The patient is tolerating medications without difficulties.    Past Medical History:  Diagnosis Date  . Acquired hammer toe of left foot 03/12/2017  . Atherosclerotic vascular disease 11/22/2019  . Cellulitis 12/19/2019  . CKD (chronic kidney disease)   . Diabetes mellitus due to underlying condition with retinopathy of both eyes ([place])   . Dry gangrene ([place]) 12/19/2019  . Essential hypertension 11/22/2019  . HLD (hyperlipidemia)   . Hypertension   . Influenza vaccination declined 01/20/2020  . Male hypogonadism 11/14/2016  . Neuropathy   . Normocytic anemia   . NPDR (nonproliferative diabetic retinopathy) ([place]) 02/09/2020   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 Dr. Tacy Dura - seen 11/24/19  . Restless leg syndrome 11/14/2016  . Right sided numbness   . Stroke ([place])   . Stroke-like symptoms 10/29/2019  . TIA (transient ischemic attack) 10/29/2019  . Tinea pedis of both feet 03/12/2017  . Ulcer of left lower extremity with fat layer exposed ([place]) 01/20/2020  . Uncontrolled type 2 diabetes mellitus with diabetic polyneuropathy, without long-term current use of insulin ([place]) 11/12/2016     Surgical History:  Past Surgical History:  Procedure Laterality Date  . AMPUTATION Left 12/20/2019   Procedure: AMPUTATION 5TH TOE;  Surgeon: Erle Crocker, MD;  Location: [facility];  Service: Orthopedics;  Laterality: Left;     (Not in a hospital admission)   Inpatient Medications:   Allergies: No Known Allergies  Social History   Socioeconomic History  . Marital status: Single  Spouse name: Not on file  . Number of children: Not on file  . Years of education: Not on file  . Highest education level: Not on file  Occupational History  . Not on file  Tobacco Use  .  Smoking status: Never Smoker  . Smokeless tobacco: Never Used  Vaping Use  . Vaping Use: Never used  Substance and Sexual Activity  . Alcohol use: No  . Drug use: Never  . Sexual activity: Not on file  Other Topics Concern  . Not on file  Social History Narrative  . Not on file   Social Determinants of Health   Financial Resource Strain: Not on file  Food Insecurity: Not on file  Transportation Needs: Not on file  Physical Activity: Not on file  Stress: Not on file  Social Connections: Not on file  Intimate Partner Violence: Not on file     Family History  Problem Relation Age of Onset  . Hypertension Father   . Cancer Father   . Diabetes Paternal Aunt   . Heart disease Neg Hx       Review of Systems: All other systems reviewed and are otherwise negative except as noted above.  Physical Exam: There were no vitals filed for this visit.  GEN- The patient is well appearing, alert and oriented x 3 today.   Head- normocephalic, atraumatic Eyes-  Sclera clear, conjunctiva pink Ears- hearing intact Oropharynx- clear Neck- supple Lungs- Clear to ausculation bilaterally, normal work of breathing Heart- Regular rate and rhythm, no murmurs, rubs or gallops  GI- soft, NT, ND, + BS Extremities- no clubbing, cyanosis, or edema MS- no significant deformity or atrophy Skin- no rash or lesion Psych- euthymic mood, full affect   Labs:   Lab Results  Component Value Date   WBC 5.6 02/18/2020   HGB 10.9 (L) 02/18/2020   HCT 32.8 (L) 02/18/2020   MCV 82.2 02/18/2020   PLT 180 02/18/2020   No results for input(s): NA, K, CL, CO2, BUN, CREATININE, CALCIUM, PROT, BILITOT, ALKPHOS, ALT, AST, GLUCOSE in the last 168 hours.  Invalid input(s): LABALBU   Radiology/Studies: No results found.  12-lead ECG today shows sinus bradycardia at 54 bpm (personally reviewed) All prior EKG's in EPIC reviewed with no documented atrial fibrillation  Telemetry N/A (personally  reviewed)  Assessment and Plan:  1. 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  Per neurology, a complete work up would ideally include a TEE. We will arrange for this with a LOOP to follow if unremarkable.   [name], PA-C 09/06/2020 8:25 AM

## 2020-09-06 NOTE — Patient Instructions (Addendum)
You are scheduled for a TEE on 09/22/2020  with Dr. Gardiner Rhyme.  Please arrive at the [facility]Main Entr[facility] at [facility] [address] [place], [place] [address] at 7:30 am.   DIET: Nothing to eat or drink after midnight except a sip of water with medications (see medication instructions below)  Medication Instructions: Hold Hydrochlorothiazide  You must have a responsible person to drive you home and stay in the waiting area during your procedure. Failure to do so could result in cancellation.  Bring your insurance cards.  *Special Note: Every effort is made to have your procedure done on time. Occasionally there are emergencies that occur at the hospital that may cause delays. Please be patient if a delay does occur.   ----------------------------------------------------------------------------------------------------------------------------------  Due to recent COVID-19 restrictions implemented by our local and state authorities and in an effort to keep both patients and staff as safe as possible, our hospital system requires COVID-19 testing prior to certain scheduled hospital procedures.  Please go to [place]. [place],  [id number] on 09/20/2020 at 10:00am.   This is a drive up testing site.  You will not need to exit your vehicle.  You will not be billed at the time of testing but may receive a bill later depending on your insurance. You must agree to self-quarantine from the time of your testing until the procedure date on 09/22/2020. This should included staying home with ONLY the people you live with.  Avoid take-out, grocery store shopping or leaving the house for any non-emergent reason.  Failure to have your COVID-19 test done on the date and time you have been scheduled will result in cancellation of your procedure.  Please call our office at [phone] if you have any  questions.  --------------------------------------------------------------------------------------------------------------------------------   Implantable Loop Recorder Placement, Care After This sheet gives you information about how to care for yourself after your procedure. Your health care provider may also give you more specific instructions. If you have problems or questions, contact your health care provider. What can I expect after the procedure? After the procedure, it is common to have:  Soreness or discomfort near the incision.  Some swelling or bruising near the incision. Follow these instructions at home: Incision care  Follow instructions from your health care provider about how to take care of your incision. Make sure you: ? Wash your hands with soap and water before you change your bandage (dressing). If soap and water are not available, use hand sanitizer. ? Change your dressing as told by your health care provider. ? Keep your dressing dry. ? Leave stitches (sutures), skin glue, or adhesive strips in place. These skin closures may need to stay in place for 2 weeks or longer. If adhesive strip edges start to loosen and curl up, you may trim the loose edges. Do not remove adhesive strips completely unless your health care provider tells you to do that.  Check your incision area every day for signs of infection. Check for: ? Redness, swelling, or pain. ? Fluid or blood. ? Warmth. ? Pus or a bad smell.  Do not take baths, swim, or use a hot tub until your health care provider approves. Ask your health care provider if you can take showers.   Activity  Return to your normal activities as told by your health care provider. Ask your health care provider what activities are safe for you.  Do not drive for 24 hours if you were given  a sedative during your procedure.   General instructions  Follow instructions from your health care provider about how to manage your  implantable loop recorder and transmit the information. Learn how to activate a recording if this is necessary for your type of device.  Do not go through a metal detection gate, and do not let someone hold a metal detector over your chest. Show your ID card.  Do not have an MRI unless you check with your health care provider first.  Take over-the-counter and prescription medicines only as told by your health care provider.  Keep all follow-up visits as told by your health care provider. This is important. Contact a health care provider if:  You have redness, swelling, or pain around your incision.  You have a fever.  You have pain that is not relieved by your pain medicine.  You have triggered your device because of fainting (syncope) or because of a heartbeat that feels like it is racing, slow, fluttering, or skipping (palpitations). Get help right away if you have:  Chest pain.  Difficulty breathing. Summary  After the procedure, it is common to have soreness or discomfort near the incision.  Change your dressing as told by your health care provider.  Follow instructions from your health care provider about how to manage your implantable loop recorder and transmit the information.  Keep all follow-up visits as told by your health care provider. This is important. This information is not intended to replace advice given to you by your health care provider. Make sure you discuss any questions you have with your health care provider. Document Revised: 06/28/2017 Document Reviewed: 06/28/2017 Elsevier Patient Education  2021 [employer].

## 2020-09-06 NOTE — H&P (View-Only) (Signed)
ELECTROPHYSIOLOGY CONSULT NOTE  Patient ID: Douglas Briggs MRN: [id number], DOB/AGE: Jan 27, 1961   Admit date: (Not on file) Date of Consult: 09/06/2020  Primary Physician: Ladell Pier, MD Primary Cardiologist: No primary care provider on file.  Primary Electrophysiologist: New to EP / Case has been discussed with Dr. Curt Bears Reason for Consultation: Cryptogenic stroke; recommendations regarding Implantable Loop Recorder Insurance: [place] Medicaid  History of Present Illness EP has been asked to evaluate Douglas Briggs for placement of an implantable loop recorder to monitor for atrial fibrillation by Dr Leonie Man.  The patient was admitted on 10/2019 and underwent work up, but was lost to follow up in regards to TEE/LOOP as outpatient.   Work up for stroke included:    CT head - No acute intracranial abnormality.   MRI head - 10 mm late subacute infarct within the right frontal white matter. Several additional subcentimeter acute/early subacute infarcts within the right frontal operculum, left temporoparietal periventricular white matter, right frontoparietal white matter and within the midline cerebellum as described. Background mild chronic small vessel ischemic changes within the cerebral white matter. Chronic thalamic and right cerebellar lacunar infarcts.   CTA H&N - Severe left cavernous and supraclinoid ICA stenosis. Advanced left P2 segment stenosis. 50% right V1 segment stenosis.   Bilateral Lower Extremity Venous Dopplers - no DVT bilaterally  LE venous doppler - incidental finding of left SFA occlusion with distal recon (Has been seen by Dr. Oneida Alar)  Echo 10/30/2019 LVEF 50-55%  There was no mention of arrhythmias in patients inpatient notes. Complete work up would ideally require a TEE per Neurology.   Echocardiogram as above. Lab work is reviewed.  The patient is doing very well today. He has no functional deficits left from his stroke, though he does have intermittent memory  issues that he attributes to this. He denies symptoms of palpitations, chest pain, shortness of breath, orthopnea, PND, lower extremity edema, claudication, dizziness, presyncope, syncope, bleeding, or neurologic sequela. The patient is tolerating medications without difficulties.    Past Medical History:  Diagnosis Date  . Acquired hammer toe of left foot 03/12/2017  . Atherosclerotic vascular disease 11/22/2019  . Cellulitis 12/19/2019  . CKD (chronic kidney disease)   . Diabetes mellitus due to underlying condition with retinopathy of both eyes ([place])   . Dry gangrene ([place]) 12/19/2019  . Essential hypertension 11/22/2019  . HLD (hyperlipidemia)   . Hypertension   . Influenza vaccination declined 01/20/2020  . Male hypogonadism 11/14/2016  . Neuropathy   . Normocytic anemia   . NPDR (nonproliferative diabetic retinopathy) ([place]) 02/09/2020   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 Dr. Tacy Dura - seen 11/24/19  . Restless leg syndrome 11/14/2016  . Right sided numbness   . Stroke ([place])   . Stroke-like symptoms 10/29/2019  . TIA (transient ischemic attack) 10/29/2019  . Tinea pedis of both feet 03/12/2017  . Ulcer of left lower extremity with fat layer exposed ([place]) 01/20/2020  . Uncontrolled type 2 diabetes mellitus with diabetic polyneuropathy, without long-term current use of insulin ([place]) 11/12/2016     Surgical History:  Past Surgical History:  Procedure Laterality Date  . AMPUTATION Left 12/20/2019   Procedure: AMPUTATION 5TH TOE;  Surgeon: Erle Crocker, MD;  Location: [facility];  Service: Orthopedics;  Laterality: Left;     (Not in a hospital admission)   Inpatient Medications:   Allergies: No Known Allergies  Social History   Socioeconomic History  . Marital status: Single  Spouse name: Not on file  . Number of children: Not on file  . Years of education: Not on file  . Highest education level: Not on file  Occupational History  . Not on file  Tobacco Use  .  Smoking status: Never Smoker  . Smokeless tobacco: Never Used  Vaping Use  . Vaping Use: Never used  Substance and Sexual Activity  . Alcohol use: No  . Drug use: Never  . Sexual activity: Not on file  Other Topics Concern  . Not on file  Social History Narrative  . Not on file   Social Determinants of Health   Financial Resource Strain: Not on file  Food Insecurity: Not on file  Transportation Needs: Not on file  Physical Activity: Not on file  Stress: Not on file  Social Connections: Not on file  Intimate Partner Violence: Not on file     Family History  Problem Relation Age of Onset  . Hypertension Father   . Cancer Father   . Diabetes Paternal Aunt   . Heart disease Neg Hx       Review of Systems: All other systems reviewed and are otherwise negative except as noted above.  Physical Exam: There were no vitals filed for this visit.  GEN- The patient is well appearing, alert and oriented x 3 today.   Head- normocephalic, atraumatic Eyes-  Sclera clear, conjunctiva pink Ears- hearing intact Oropharynx- clear Neck- supple Lungs- Clear to ausculation bilaterally, normal work of breathing Heart- Regular rate and rhythm, no murmurs, rubs or gallops  GI- soft, NT, ND, + BS Extremities- no clubbing, cyanosis, or edema MS- no significant deformity or atrophy Skin- no rash or lesion Psych- euthymic mood, full affect   Labs:   Lab Results  Component Value Date   WBC 5.6 02/18/2020   HGB 10.9 (L) 02/18/2020   HCT 32.8 (L) 02/18/2020   MCV 82.2 02/18/2020   PLT 180 02/18/2020   No results for input(s): NA, K, CL, CO2, BUN, CREATININE, CALCIUM, PROT, BILITOT, ALKPHOS, ALT, AST, GLUCOSE in the last 168 hours.  Invalid input(s): LABALBU   Radiology/Studies: No results found.  12-lead ECG today shows sinus bradycardia at 54 bpm (personally reviewed) All prior EKG's in EPIC reviewed with no documented atrial fibrillation  Telemetry N/A (personally  reviewed)  Assessment and Plan:  1. 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  Per neurology, a complete work up would ideally include a TEE. We will arrange for this with a LOOP to follow if unremarkable.   [name], PA-C 09/06/2020 8:25 AM

## 2020-09-07 LAB — CBC
Hematocrit: 34.6 % — ABNORMAL LOW (ref 37.5–51.0)
Hemoglobin: 11.1 g/dL — ABNORMAL LOW (ref 13.0–17.7)
MCH: 27.2 pg (ref 26.6–33.0)
MCHC: 32.1 g/dL (ref 31.5–35.7)
MCV: 85 fL (ref 79–97)
Platelets: 164 10*3/uL (ref 150–450)
RBC: 4.08 x10E6/uL — ABNORMAL LOW (ref 4.14–5.80)
RDW: 14.2 % (ref 11.6–15.4)
WBC: 4.2 10*3/uL (ref 3.4–10.8)

## 2020-09-07 LAB — BASIC METABOLIC PANEL
BUN/Creatinine Ratio: 7 — ABNORMAL LOW (ref 10–24)
BUN: 13 mg/dL (ref 8–27)
CO2: 21 mmol/L (ref 20–29)
Calcium: 8.3 mg/dL — ABNORMAL LOW (ref 8.6–10.2)
Chloride: 107 mmol/L — ABNORMAL HIGH (ref 96–106)
Creatinine, Ser: 1.75 mg/dL — ABNORMAL HIGH (ref 0.76–1.27)
Glucose: 95 mg/dL (ref 65–99)
Potassium: 4.4 mmol/L (ref 3.5–5.2)
Sodium: 139 mmol/L (ref 134–144)
eGFR: 44 mL/min/{1.73_m2} — ABNORMAL LOW (ref >59)

## 2020-09-11 ENCOUNTER — Ambulatory Visit: Payer: Medicaid Other | Attending: Internal Medicine | Admitting: Internal Medicine

## 2020-09-11 ENCOUNTER — Encounter: Payer: Self-pay | Admitting: Internal Medicine

## 2020-09-11 ENCOUNTER — Other Ambulatory Visit: Payer: Self-pay

## 2020-09-11 VITALS — BP 220/111 | HR 68 | Resp 16 | Wt 150.6 lb

## 2020-09-11 DIAGNOSIS — E785 Hyperlipidemia, unspecified: Secondary | ICD-10-CM

## 2020-09-11 DIAGNOSIS — E1122 Type 2 diabetes mellitus with diabetic chronic kidney disease: Secondary | ICD-10-CM | POA: Diagnosis not present

## 2020-09-11 DIAGNOSIS — E1142 Type 2 diabetes mellitus with diabetic polyneuropathy: Secondary | ICD-10-CM | POA: Diagnosis not present

## 2020-09-11 DIAGNOSIS — N1832 Chronic kidney disease, stage 3b: Secondary | ICD-10-CM

## 2020-09-11 DIAGNOSIS — I152 Hypertension secondary to endocrine disorders: Secondary | ICD-10-CM

## 2020-09-11 DIAGNOSIS — R634 Abnormal weight loss: Secondary | ICD-10-CM

## 2020-09-11 DIAGNOSIS — E1169 Type 2 diabetes mellitus with other specified complication: Secondary | ICD-10-CM

## 2020-09-11 DIAGNOSIS — R600 Localized edema: Secondary | ICD-10-CM | POA: Diagnosis not present

## 2020-09-11 DIAGNOSIS — E441 Mild protein-calorie malnutrition: Secondary | ICD-10-CM

## 2020-09-11 DIAGNOSIS — E1159 Type 2 diabetes mellitus with other circulatory complications: Secondary | ICD-10-CM

## 2020-09-11 HISTORY — DX: Type 2 diabetes mellitus with other specified complication: E11.69

## 2020-09-11 HISTORY — DX: Mild protein-calorie malnutrition: E44.1

## 2020-09-11 LAB — GLUCOSE, POCT (MANUAL RESULT ENTRY): POC Glucose: 123 mg/dl — AB (ref 70–99)

## 2020-09-11 LAB — POCT GLYCOSYLATED HEMOGLOBIN (HGB A1C): HbA1c, POC (controlled diabetic range): 6 % (ref 0.0–7.0)

## 2020-09-11 MED ORDER — FUROSEMIDE 20 MG PO TABS: 20.0000 mg | ORAL_TABLET | Freq: Every day | ORAL | 4 refills | Status: DC

## 2020-09-11 MED ORDER — CLONIDINE HCL 0.1 MG PO TABS: 0.1000 mg | ORAL_TABLET | Freq: Once | ORAL | Status: DC

## 2020-09-11 NOTE — Progress Notes (Signed)
Patient ID: Douglas Briggs, male    DOB: 01/10/1961  MRN: [id number]  CC: Diabetes, Hypertension, and Foot Swelling (B/l )   Subjective: Douglas Briggs is a 60 y.o. 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  Food does not taste same.  Eats about 2 meals a day. Eats a lot of fruits for snack.   Endorses diarrhea x 2 wks, loose. Last BM was yesterday. No blood in stools +vomiting if he tates meds without eating enough x 2 wks.  Last episode was 2 wks ago -on Metformin for over 1 yr. No dysphagia Nonsmoker.  Occasional cough Passing urine ok.  No hematuria Reports no issue with depression No palpitations.  Feel cold a lot No street drugs use  HTN/CKD 3: BP is quite elevated today.  Did not take meds as yet today.  Supposed to be on carvedilol, clonidine, hydrochlorothiazide, Cozaar Ate some barbacue chicken yesterday which he thinks may have been salted. No CP/SOB. +LE edema x a few wks.  Limits salt in foods.  Echo done June of last year revealed EF of 50 to 55% with grade 1 diastolic dysfunction. GFR over the past year has fluctuated from 60-42.  Most recent was 58.  He has anemia of chronic disease.  Hemoglobin has remained stable with recent hemoglobin of 11.1 and hematocrit of 35.  He is not on any NSAIDs.  DIABETES TYPE 2 Last A1C:   Results for orders placed or performed in visit on 09/11/20  POCT glucose (manual entry)  Result Value Ref Range   POC Glucose 123 (A) 70 - 99 mg/dl  POCT glycosylated hemoglobin (Hb A1C)   Result Value Ref Range   Hemoglobin A1C     HbA1c POC (<> result, manual entry)     HbA1c, POC (prediabetic range)     HbA1c, POC (controlled diabetic range) 6.0 0.0 - 7.0 %    Med Adherence:  '[x]'$  Yes  -he is on Metformin 500 mg once a day, Farxiga, Amaryl Medication side effects:  '[]'$  Yes    '[x]'$  No Home Monitoring?  '[]'$  Yes    '[x]'$  No - not in past 2 wks Home glucose results range: Diet Adherence: Poor appetite recently Exercise: '[x]'$  Yes -rides his bike just about every day for 1-2 hrs Hypoglycemic episodes?: '[]'$  Yes    '[x]'$  No Numbness of the feet? '[x]'$  Yes    '[]'$  No Retinopathy hx? '[]'$  Yes    '[]'$  No Last eye exam:  Comments:   TIA/CVA: Saw the cardiologist in January of this year.  He was also seen by the electrophysiology PA on the sixth of this month.  The plan is to do a TEE and if unremarkable then proceed with implanting a loop recorder.  Patient Active Problem List   Diagnosis Date Noted  . Diabetes mellitus due to underlying condition with retinopathy of both eyes ([place])   . Hypertension   . Neuropathy   . NPDR (nonproliferative diabetic retinopathy) ([place]) 02/09/2020  . Ulcer of left lower extremity with fat layer exposed ([place])  01/20/2020  . Influenza vaccination declined 01/20/2020  . Cellulitis 12/19/2019  . Dry gangrene ([place]) 12/19/2019  . HLD (hyperlipidemia)   . Normocytic anemia   . CKD (chronic kidney disease)   . Essential hypertension 11/22/2019  . Atherosclerotic vascular disease 11/22/2019  . Stroke ([place]) 10/30/2019  . TIA (transient ischemic attack) 10/29/2019  . Stroke-like symptoms 10/29/2019  . Right sided numbness   . Acquired hammer toe of left foot 03/12/2017  . Tinea pedis of both feet 03/12/2017  . Male hypogonadism 11/14/2016  . Restless leg syndrome 11/14/2016  . Uncontrolled type 2 diabetes mellitus with diabetic polyneuropathy, without long-term current use of insulin ([place]) 11/12/2016     Current Outpatient Medications on File Prior to Visit   Medication Sig Dispense Refill  . aspirin EC 81 MG tablet Take 1 tablet (81 mg total) by mouth daily. 150 tablet 2  . atorvastatin (LIPITOR) 40 MG tablet Take 80 mg by mouth daily.    . carvedilol (COREG) 3.125 MG tablet Take 1 tablet (3.125 mg total) by mouth 2 (two) times daily with a meal. 60 tablet 6  . cloNIDine (CATAPRES) 0.1 MG tablet Take 1 tablet (0.1 mg total) by mouth 2 (two) times daily. 60 tablet 11  . dapagliflozin propanediol (FARXIGA) 10 MG TABS tablet Take 1 tablet by mouth daily.    Marland Kitchen ezetimibe (ZETIA) 10 MG tablet Take 1 tablet (10 mg total) by mouth daily. 90 tablet 3  . famotidine (PEPCID) 20 MG tablet Take 1 tablet (20 mg total) by mouth 2 (two) times daily. To reduce stomach acid 60 tablet 5  . glimepiride (AMARYL) 1 MG tablet Take 1 tablet (1 mg total) by mouth daily with breakfast. 30 tablet 6  . hydrochlorothiazide (HYDRODIURIL) 25 MG tablet Take 1 tablet (25 mg total) by mouth daily. 30 tablet 6  . JANUVIA 50 MG tablet Take 50 mg by mouth daily.     Marland Kitchen losartan (COZAAR) 100 MG tablet Take 1 tablet (100 mg total) by mouth daily. 30 tablet 6  . metFORMIN (GLUCOPHAGE) 500 MG tablet Take 1 tablet (500 mg total) by mouth at bedtime. 90 tablet 1  . Multiple Vitamin (MULTIVITAMIN WITH MINERALS) TABS tablet Take 1 tablet by mouth daily with breakfast.    . mupirocin ointment (BACTROBAN) 2 % Apply 1 application topically daily. Apply topically to toe    . nystatin cream (MYCOSTATIN) Apply 1 application topically 2 (two) times daily. X 10 days to affected skin areas then use as needed 30 g 3  . ondansetron (ZOFRAN ODT) 4 MG disintegrating tablet Take 1 tablet (4 mg total) by mouth every 8 (eight) hours as needed for nausea or vomiting. 20 tablet 0  . pregabalin (LYRICA) 25 MG capsule Take 1 capsule (25 mg total) by mouth daily. 30 capsule 4  . terbinafine (LAMISIL) 250 MG tablet Take 1 tablet by mouth daily.    Marland Kitchen VITAMIN D PO Take 1 tablet by mouth daily with breakfast.      No  current facility-administered medications on file prior to visit.    No Known Allergies  Social History   Socioeconomic History  . Marital status: Single    Spouse name: Not on file  . Number of children: Not on file  . Years of education: Not on file  . Highest education level: Not on file  Occupational History  . Not on file  Tobacco Use  . Smoking status: Never Smoker  . Smokeless tobacco: Never Used  Vaping  Use  . Vaping Use: Never used  Substance and Sexual Activity  . Alcohol use: No  . Drug use: Never  . Sexual activity: Not on file  Other Topics Concern  . Not on file  Social History Narrative  . Not on file   Social Determinants of Health   Financial Resource Strain: Not on file  Food Insecurity: Not on file  Transportation Needs: Not on file  Physical Activity: Not on file  Stress: Not on file  Social Connections: Not on file  Intimate Partner Violence: Not on file    Family History  Problem Relation Age of Onset  . Hypertension Father   . Cancer Father   . Diabetes Paternal Aunt   . Heart disease Neg Hx     Past Surgical History:  Procedure Laterality Date  . AMPUTATION Left 12/20/2019   Procedure: AMPUTATION 5TH TOE;  Surgeon: Erle Crocker, MD;  Location: [facility];  Service: Orthopedics;  Laterality: Left;    ROS: Review of Systems Negative except as stated above  PHYSICAL EXAM: BP (!) 220/111 (BP Location: Left Arm, Patient Position: Sitting, Cuff Size: Normal)   Pulse 68   Resp 16   Wt 150 lb 9.6 oz (68.3 kg)   SpO2 100%   BMI 23.24 kg/m   Wt Readings from Last 3 Encounters:  09/11/20 150 lb 9.6 oz (68.3 kg)  09/06/20 159 lb (72.1 kg)  06/21/20 166 lb 1.3 oz (75.3 kg)  228/120  Physical Exam  General appearance - alert, well appearing, older African-American male and in no distress Mental status - normal mood, behavior, speech, dress, motor activity, and thought processes Mouth - mucous membranes moist, pharynx normal  without lesions Neck - supple, no significant adenopathy Chest - clear to auscultation, no wheezes, rales or rhonchi, symmetric air entry Heart - normal rate, regular rhythm, normal S1, S2, no murmurs, rubs, clicks or gallops.  No JVD Extremities -1-2+ bilateral lower extremity and ankle edema. Diabetic Foot Exam - Simple   Simple Foot Form Visual Inspection No deformities, no ulcerations, no other skin breakdown bilaterally: Yes Sensation Testing Intact to touch and monofilament testing bilaterally: Yes Pulse Check Posterior Tibialis and Dorsalis pulse intact bilaterally: Yes Comments 1+ edema of both dorsal surface.  Amputated left fifth toe.  Flat-footed.     Depression screen [facility] 2/9 05/11/2020 02/25/2020 01/20/2020  Decreased Interest 0 2 0  Down, Depressed, Hopeless 0 0 0  PHQ - 2 Score 0 2 0  Altered sleeping - 1 -  Tired, decreased energy - 3 -  Change in appetite - 0 -  Feeling bad or failure about yourself  - 0 -  Trouble concentrating - 0 -  Moving slowly or fidgety/restless - 3 -  Suicidal thoughts - 0 -  PHQ-9 Score - 9 -    CMP Latest Ref Rng & Units 09/06/2020 04/14/2020 04/05/2020  Glucose 65 - 99 mg/dL 95 96 -  BUN 8 - 27 mg/dL 13 26(H) -  Creatinine 0.76 - 1.27 mg/dL 1.75(H) 1.56(H) -  Sodium 134 - 144 mmol/L 139 141 -  Potassium 3.5 - 5.2 mmol/L 4.4 4.9 -  Chloride 96 - 106 mmol/L 107(H) 107(H) -  CO2 20 - 29 mmol/L 21 25 -  Calcium 8.6 - 10.2 mg/dL 8.3(L) 9.2 -  Total Protein 6.0 - 8.5 g/dL - - 6.0  Total Bilirubin 0.0 - 1.2 mg/dL - - 0.2  Alkaline Phos 44 - 121 IU/L - - 153(H)  AST  0 - 40 IU/L - - 19  ALT 0 - 44 IU/L - - 33   Lipid Panel     Component Value Date/Time   CHOL 203 (H) 04/05/2020 1019   TRIG 116 04/05/2020 1019   HDL 54 04/05/2020 1019   CHOLHDL 3.8 04/05/2020 1019   CHOLHDL 4.4 12/21/2019 0123   VLDL 23 12/21/2019 0123   LDLCALC 128 (H) 04/05/2020 1019    CBC    Component Value Date/Time   WBC 4.2 09/06/2020 1327   WBC 5.6  02/18/2020 1355   RBC 4.08 (L) 09/06/2020 1327   RBC 3.99 (L) 02/18/2020 1355   HGB 11.1 (L) 09/06/2020 1327   HCT 34.6 (L) 09/06/2020 1327   PLT 164 09/06/2020 1327   MCV 85 09/06/2020 1327   MCH 27.2 09/06/2020 1327   MCH 27.3 02/18/2020 1355   MCHC 32.1 09/06/2020 1327   MCHC 33.2 02/18/2020 1355   RDW 14.2 09/06/2020 1327   LYMPHSABS 1.3 12/22/2019 0424   MONOABS 0.5 12/22/2019 0424   EOSABS 0.2 12/22/2019 0424   BASOSABS 0.0 12/22/2019 0424    ASSESSMENT AND PLAN: 1. Type 2 diabetes mellitus with stage 3b chronic kidney disease, without long-term current use of insulin ([place]) Diabetes is well controlled. Continue his oral medications Amaryl, Januvia and Metformin for now.  If kidney function continues to decline, we will have to stop the Metformin and the Januvia. -Continue to monitor GFR. Stressed the importance of good blood pressure and diabetes control to help preserve kidney function. -Advised not to take any NSAIDs. - POCT glucose (manual entry) - POCT glycosylated hemoglobin (Hb A1C)  2. Hypertension associated with diabetes ([place]) Not at goal.  Advised patient to take his medications as soon as he returns home.  Given clonidine 0.1 mg today while here with slight decrease in blood pressure.  Advised to limit salt in the foods.  Bring him back for short-term follow-up in a few weeks - cloNIDine (CATAPRES) tablet 0.1 mg - Hepatic Function Panel  3. Edema of both legs Stop hydrochlorothiazide. Start low-dose furosemide instead.  Recheck on follow-up visit. - furosemide (LASIX) 20 MG tablet; Take 1 tablet (20 mg total) by mouth daily.  Dispense: 30 tablet; Refill: 4  4. Weight loss, unintentional Patient may be over exercising given that he rides his bike for 1 to 2 hours a day.  He also reports poor appetite.  Advised that he cut back his exercise to no more than 45 minutes a day. Recommend getting some Glucerna shakes over-the-counter and drinking 1 can twice a day to  supplement his meals. -Check TSH given the weight loss and some diarrhea. - TSH+T4F+T3Free - HIV Antibody (routine testing w rflx)  5. Protein-calorie malnutrition, mild (H[facility]) Albumin has been low on past liver panel but he has not had one in several months.  See plan in #4 above - Hepatic Function Panel  6. Hyperlipidemia associated with type 2 diabetes mellitus ([place]) - Hepatic Function Panel     Patient was given the opportunity to ask questions.  Patient verbalized understanding of the plan and was able to repeat key elements of the plan.   Orders Placed This Encounter  Procedures  . POCT glucose (manual entry)  . POCT glycosylated hemoglobin (Hb A1C)     Requested Prescriptions    No prescriptions requested or ordered in this encounter    No follow-ups on file.  Karle Plumber, MD, FACP

## 2020-09-11 NOTE — Patient Instructions (Signed)
Stop hydrochlorothiazide. Start furosemide 20 mg daily instead.  Continue to limit salt in the foods.  Purchase Glucerna shakes over-the-counter and drink 1 can twice a day. Decrease your exercise to no more than 45 minutes a day.

## 2020-09-12 LAB — HEPATIC FUNCTION PANEL
ALT: 23 IU/L (ref 0–44)
AST: 15 IU/L (ref 0–40)
Albumin: 2.8 g/dL — ABNORMAL LOW (ref 3.8–4.9)
Alkaline Phosphatase: 147 IU/L — ABNORMAL HIGH (ref 44–121)
Bilirubin Total: <0.2 mg/dL (ref 0.0–1.2)
Bilirubin, Direct: <0.1 mg/dL (ref 0.00–0.40)
Total Protein: 5.6 g/dL — ABNORMAL LOW (ref 6.0–8.5)

## 2020-09-12 LAB — TSH+T4F+T3FREE
Free T4: 0.97 ng/dL (ref 0.82–1.77)
T3, Free: 1.9 pg/mL — ABNORMAL LOW (ref 2.0–4.4)
TSH: 2.3 u[IU]/mL (ref 0.450–4.500)

## 2020-09-12 LAB — HIV ANTIBODY (ROUTINE TESTING W REFLEX): HIV Screen 4th Generation wRfx: NONREACTIVE

## 2020-09-19 DIAGNOSIS — I10 Essential (primary) hypertension: Secondary | ICD-10-CM | POA: Insufficient documentation

## 2020-09-19 DIAGNOSIS — G629 Polyneuropathy, unspecified: Secondary | ICD-10-CM | POA: Insufficient documentation

## 2020-09-20 ENCOUNTER — Other Ambulatory Visit (HOSPITAL_COMMUNITY)
Admission: RE | Admit: 2020-09-20 | Discharge: 2020-09-20 | Disposition: A | Discharge status: Home / Self Care | Payer: Medicaid Other | Source: Ambulatory Visit | Attending: Cardiology | Admitting: Cardiology

## 2020-09-20 DIAGNOSIS — Z20822 Contact with and (suspected) exposure to covid-19: Secondary | ICD-10-CM | POA: Diagnosis not present

## 2020-09-20 DIAGNOSIS — Z01812 Encounter for preprocedural laboratory examination: Secondary | ICD-10-CM | POA: Diagnosis present

## 2020-09-20 LAB — SARS CORONAVIRUS 2 (TAT 6-24 HRS): SARS Coronavirus 2: NEGATIVE

## 2020-09-22 ENCOUNTER — Ambulatory Visit (HOSPITAL_BASED_OUTPATIENT_CLINIC_OR_DEPARTMENT_OTHER): Payer: Medicaid Other

## 2020-09-22 ENCOUNTER — Encounter (HOSPITAL_COMMUNITY)
Admission: RE | Disposition: A | Discharge status: Home / Self Care | Payer: Medicaid Other | Source: Home / Self Care | Attending: Cardiology

## 2020-09-22 ENCOUNTER — Ambulatory Visit (HOSPITAL_COMMUNITY)
Admission: RE | Admit: 2020-09-22 | Discharge: 2020-09-22 | Disposition: A | Discharge status: Home / Self Care | Payer: Medicaid Other | Attending: Cardiology | Admitting: Cardiology

## 2020-09-22 ENCOUNTER — Other Ambulatory Visit: Payer: Self-pay

## 2020-09-22 ENCOUNTER — Ambulatory Visit (HOSPITAL_COMMUNITY): Payer: Medicaid Other | Admitting: Anesthesiology

## 2020-09-22 ENCOUNTER — Ambulatory Visit (HOSPITAL_COMMUNITY)
Admission: RE | Disposition: A | Discharge status: Home / Self Care | Payer: Medicaid Other | Source: Home / Self Care | Attending: Cardiology

## 2020-09-22 ENCOUNTER — Encounter (HOSPITAL_COMMUNITY): Payer: Self-pay | Admitting: Cardiology

## 2020-09-22 DIAGNOSIS — I10 Essential (primary) hypertension: Secondary | ICD-10-CM

## 2020-09-22 DIAGNOSIS — I639 Cerebral infarction, unspecified: Secondary | ICD-10-CM

## 2020-09-22 DIAGNOSIS — I6381 Other cerebral infarction due to occlusion or stenosis of small artery: Secondary | ICD-10-CM | POA: Diagnosis present

## 2020-09-22 HISTORY — PX: TEE WITHOUT CARDIOVERSION: SHX5443

## 2020-09-22 HISTORY — PX: BUBBLE STUDY: SHX6837

## 2020-09-22 HISTORY — PX: LOOP RECORDER INSERTION: EP1214

## 2020-09-22 LAB — ECHO TEE
AR max vel: 3.04 cm2
AV Area VTI: 3.37 cm2
AV Area mean vel: 3.05 cm2
AV Mean grad: 5 mmHg
AV Peak grad: 8.9 mmHg
Ao pk vel: 1.49 m/s

## 2020-09-22 SURGERY — LOOP RECORDER INSERTION

## 2020-09-22 SURGERY — ECHOCARDIOGRAM, TRANSESOPHAGEAL
Anesthesia: Monitor Anesthesia Care

## 2020-09-22 MED ORDER — PROPOFOL 500 MG/50ML IV EMUL
INTRAVENOUS | Status: DC | PRN
  Administered 2020-09-22: 125 ug/kg/min via INTRAVENOUS

## 2020-09-22 MED ORDER — HYDRALAZINE HCL 20 MG/ML IJ SOLN
10.0000 mg | INTRAMUSCULAR | Status: DC | PRN
  Administered 2020-09-22: 10 mg via INTRAVENOUS

## 2020-09-22 MED ORDER — HYDRALAZINE HCL 20 MG/ML IJ SOLN
INTRAMUSCULAR | Status: AC
  Filled 2020-09-22: qty 1

## 2020-09-22 MED ORDER — PROPOFOL 10 MG/ML IV BOLUS
INTRAVENOUS | Status: DC | PRN
  Administered 2020-09-22: 40 mg via INTRAVENOUS
  Administered 2020-09-22: 30 mg via INTRAVENOUS

## 2020-09-22 MED ORDER — BUTAMBEN-TETRACAINE-BENZOCAINE 2-2-14 % EX AERO
INHALATION_SPRAY | CUTANEOUS | Status: DC | PRN
  Administered 2020-09-22: 2 via TOPICAL

## 2020-09-22 MED ORDER — SODIUM CHLORIDE 0.9 % IV SOLN: INTRAVENOUS | Status: DC

## 2020-09-22 MED ORDER — EPINEPHRINE PF 1 MG/ML IJ SOLN
INTRAMUSCULAR | Status: DC | PRN
  Administered 2020-09-22: 30 mL via SUBCUTANEOUS

## 2020-09-22 MED ORDER — LIDOCAINE-EPINEPHRINE 1 %-1:100000 IJ SOLN
INTRAMUSCULAR | Status: AC
  Filled 2020-09-22: qty 1

## 2020-09-22 SURGICAL SUPPLY — 2 items
MONITOR REVEAL LINQ II (Prosthesis & Implant Heart) ×2 IMPLANT
PACK LOOP INSERTION (CUSTOM PROCEDURE TRAY) ×2 IMPLANT

## 2020-09-22 NOTE — Anesthesia Postprocedure Evaluation (Signed)
Anesthesia Post Note  Patient: Raygen Dahm  Procedure(s) Performed: TRANSESOPHAGEAL ECHOCARDIOGRAM (TEE) (N/A ) BUBBLE STUDY     Patient location during evaluation: Endoscopy Anesthesia Type: MAC Level of consciousness: awake and alert Pain management: pain level controlled Vital Signs Assessment: post-procedure vital signs reviewed and stable Respiratory status: spontaneous breathing, nonlabored ventilation and respiratory function stable Cardiovascular status: blood pressure returned to baseline and stable Postop Assessment: no apparent nausea or vomiting Anesthetic complications: no   No complications documented.  Last Vitals:  Vitals:   09/22/20 0916 09/22/20 0920  BP: 126/64   Pulse: 94 92  Resp: 12 18  Temp: 36.5 C   SpO2: 100% 100%    Last Pain:  Vitals:   09/22/20 0916  TempSrc: Oral  PainSc: 0-No pain                 Lynda Rainwater

## 2020-09-22 NOTE — Progress Notes (Addendum)
Client received from Endo via stretcher, report received from Doran Durand; client awake,alert, and oriented; no voiced complaints;on arrival from Endo IV fluids infusing without IV pump and 50cc to count from 500cc bag

## 2020-09-22 NOTE — CV Procedure (Signed)
     TRANSESOPHAGEAL ECHOCARDIOGRAM   NAME:  Douglas Briggs   MRN: [id number] DOB:  09/13/1960   ADMIT DATE: 09/22/2020  INDICATIONS: CVA  PROCEDURE:   Informed consent was obtained prior to the procedure. The risks, benefits and alternatives for the procedure were discussed and the patient comprehended these risks.  Risks include, but are not limited to, cough, sore throat, vomiting, nausea, somnolence, esophageal and stomach trauma or perforation, bleeding, low blood pressure, aspiration, pneumonia, infection, trauma to the teeth and death.    After a procedural time-out, the oropharynx was anesthetized and the patient was sedated by the anesthesia service. The transesophageal probe was inserted in the esophagus and stomach without difficulty and multiple views were obtained. Anesthesia was monitored by Silas Flood, CRNA.    COMPLICATIONS:    There were no immediate complications.  FINDINGS:  No cardiac source of embolism seen.  Negative bubble study  Oswaldo Milian MD [facility]  [address] [place], [place] [address] [phone]   9:14 AM

## 2020-09-22 NOTE — Interval H&P Note (Signed)
History and Physical Interval Note:  09/22/2020 8:21 AM  Douglas Briggs  has presented today for surgery, with the diagnosis of A-FIB.  The various methods of treatment have been discussed with the patient and family. After consideration of risks, benefits and other options for treatment, the patient has consented to  Procedure(s) with comments: TRANSESOPHAGEAL ECHOCARDIOGRAM (TEE) (N/A) - LOOP as a surgical intervention.  The patient's history has been reviewed, patient examined, no change in status, stable for surgery.  I have reviewed the patient's chart and labs.  Questions were answered to the patient's satisfaction.     Donato Heinz

## 2020-09-22 NOTE — Progress Notes (Signed)
  Echocardiogram 2D Echocardiogram color and doppler has been performed.  Douglas Briggs M 09/22/2020, 9:28 AM

## 2020-09-22 NOTE — Discharge Instructions (Signed)
TEE What can I expect after the procedure?  You will be monitored until you leave the hospital or clinic. This includes checking your blood pressure, heart rate, breathing rate, and blood oxygen level.  Your throat may feel sore and numb. This will get better over time. You will not be allowed to eat or drink until the numbness has gone away.  It is common to have a sore throat for a day or two.  It is up to you to get the results of your procedure. Ask how to get your results when they are ready. Follow these instructions at home:  If you were given a sedative during your procedure, do not drive or use machines until your doctor says that it is safe.  Return to your normal activities when your doctor says that it is safe.  Keep all follow-up visits. Summary  TEE is a test that uses sound waves to take pictures of your heart.  You will be given a medicine to help you relax.  Do not drive or use machines until your doctor says that it is safe. This information is not intended to replace advice given to you by your health care provider. Make sure you discuss any questions you have with your health care provider. Document Revised: 01/04/2020 Document Reviewed: 01/04/2020 Elsevier Patient Education  2021 [place].  Wound care instructions (heart monitor/loop site) Keep incision clean and dry for 3 days. You can remove outer dressing tomorrow. Leave steri-strips (little pieces of tape) on until seen in the office for wound check appointment. Call the office [phone]) for redness, drainage, swelling, or fever.

## 2020-09-22 NOTE — Transfer of Care (Signed)
Immediate Anesthesia Transfer of Care Note  Patient: Douglas Briggs  Procedure(s) Performed: TRANSESOPHAGEAL ECHOCARDIOGRAM (TEE) (N/A ) BUBBLE STUDY  Patient Location: Endoscopy Unit  Anesthesia Type:MAC  Level of Consciousness: awake, alert  and oriented  Airway & Oxygen Therapy: Patient Spontanous Breathing  Post-op Assessment: Report given to RN, Post -op Vital signs reviewed and stable and Patient moving all extremities  Post vital signs: Reviewed and stable  Last Vitals:  Vitals Value Taken Time  BP 126/64 09/22/20 0916  Temp 36.5 C 09/22/20 0916  Pulse 93 09/22/20 0921  Resp 14 09/22/20 0921  SpO2 99 % 09/22/20 0921  Vitals shown include unvalidated device data.  Last Pain:  Vitals:   09/22/20 0916  TempSrc: Oral  PainSc: 0-No pain         Complications: No complications documented.

## 2020-09-22 NOTE — Interval H&P Note (Signed)
History and Physical Interval Note:  09/22/2020 11:16 AM  Douglas Briggs  has presented today for surgery, with the diagnosis of stroke.  The various methods of treatment have been discussed with the patient and family. After consideration of risks, benefits and other options for treatment, the patient has consented to  Procedure(s): LOOP RECORDER INSERTION (N/A) as a surgical intervention.  The patient's history has been reviewed, patient examined, no change in status, stable for surgery.  I have reviewed the patient's chart and labs.  Questions were answered to the patient's satisfaction.     Kasumi Ditullio [employer]

## 2020-09-22 NOTE — Anesthesia Procedure Notes (Signed)
Procedure Name: MAC Date/Time: 09/22/2020 8:55 AM Performed by: Amadeo Garnet, CRNA Pre-anesthesia Checklist: Patient identified, Suction available, Emergency Drugs available and Patient being monitored Patient Re-evaluated:Patient Re-evaluated prior to induction Oxygen Delivery Method: Nasal cannula Preoxygenation: Pre-oxygenation with 100% oxygen Induction Type: IV induction Placement Confirmation: positive ETCO2 Dental Injury: Teeth and Oropharynx as per pre-operative assessment

## 2020-09-22 NOTE — Anesthesia Preprocedure Evaluation (Signed)
Anesthesia Evaluation  Patient identified by MRN, date of birth, ID band Patient awake    Reviewed: Allergy & Precautions, NPO status , Patient's Chart, lab work & pertinent test results  Airway Mallampati: III  TM Distance: >3 FB Neck ROM: Full    Dental  (+) Missing, Upper Dentures   Pulmonary neg pulmonary ROS,    Pulmonary exam normal breath sounds clear to auscultation       Cardiovascular hypertension, Pt. on medications and Pt. on home beta blockers Normal cardiovascular exam+ dysrhythmias Atrial Fibrillation  Rhythm:Regular Rate:Normal  ECG: SR, rate 72. LVH  ECHO: 1. Left ventricular ejection fraction, by estimation, is 50 to 55%. The left ventricle has normal function. The left ventricle has no regional wall motion abnormalities. There is mild left ventricular hypertrophy. Left ventricular diastolic parameters are consistent with Grade I diastolic dysfunction (impaired relaxation). 2. Right ventricular systolic function is normal. The right ventricular size is normal. 3. The mitral valve is normal in structure. No evidence of mitral valve regurgitation. No evidence of mitral stenosis. 4. The aortic valve is tricuspid. Aortic valve regurgitation is not visualized. No aortic stenosis is present. 5. The inferior vena cava is normal in size with greater than 50% respiratory variability, suggesting right atrial pressure of 3 mmHg.   Neuro/Psych Neuropathy TIA Neuromuscular disease CVA, No Residual Symptoms negative psych ROS   GI/Hepatic negative GI ROS, Neg liver ROS,   Endo/Other  diabetes, Oral Hypoglycemic Agents  Renal/GU negative Renal ROS     Musculoskeletal negative musculoskeletal ROS (+)   Abdominal   Peds  Hematology negative hematology ROS (+) anemia ,   Anesthesia Other Findings   Reproductive/Obstetrics                             Anesthesia Physical  Anesthesia  Plan  ASA: III  Anesthesia Plan: MAC   Post-op Pain Management:    Induction: Intravenous  PONV Risk Score and Plan: 1 and Ondansetron and Treatment may vary due to age or medical condition  Airway Management Planned: Nasal Cannula  Additional Equipment:   Intra-op Plan:   Post-operative Plan:   Informed Consent: I have reviewed the patients History and Physical, chart, labs and discussed the procedure including the risks, benefits and alternatives for the proposed anesthesia with the patient or authorized representative who has indicated his/her understanding and acceptance.     Dental advisory given  Plan Discussed with: CRNA  Anesthesia Plan Comments:         Anesthesia Quick Evaluation

## 2020-09-24 MED FILL — Lidocaine Inj 1% w/ Epinephrine-1:100000: INTRAMUSCULAR | Qty: 20 | Status: AC

## 2020-09-25 ENCOUNTER — Telehealth: Payer: Medicaid Other | Admitting: Cardiology

## 2020-09-25 ENCOUNTER — Encounter (HOSPITAL_COMMUNITY): Payer: Self-pay | Admitting: Cardiology

## 2020-10-03 ENCOUNTER — Other Ambulatory Visit: Payer: Self-pay

## 2020-10-03 ENCOUNTER — Ambulatory Visit (INDEPENDENT_AMBULATORY_CARE_PROVIDER_SITE_OTHER): Payer: Medicaid Other | Admitting: Emergency Medicine

## 2020-10-03 DIAGNOSIS — I639 Cerebral infarction, unspecified: Secondary | ICD-10-CM

## 2020-10-03 LAB — CUP PACEART INCLINIC DEVICE CHECK
Date Time Interrogation Session: 20220510131320
Implantable Pulse Generator Implant Date: 20220429

## 2020-10-03 NOTE — Patient Instructions (Signed)
Please plug in your remote monitor box beside your bed when you get home.

## 2020-10-03 NOTE — Progress Notes (Signed)
ILR wound check in clinic. Steri strips removed. Wound well healed. Monitor was on back order so relay given to patient. Monitor education provided to patient. No episodes. Questions answered.

## 2020-10-17 ENCOUNTER — Other Ambulatory Visit: Payer: Self-pay

## 2020-10-17 ENCOUNTER — Ambulatory Visit: Payer: Medicaid Other | Attending: Internal Medicine | Admitting: Internal Medicine

## 2020-10-17 ENCOUNTER — Encounter: Payer: Self-pay | Admitting: Internal Medicine

## 2020-10-17 VITALS — BP 196/85 | HR 58 | Resp 16 | Wt 162.8 lb

## 2020-10-17 DIAGNOSIS — I10 Essential (primary) hypertension: Secondary | ICD-10-CM | POA: Diagnosis not present

## 2020-10-17 DIAGNOSIS — E1129 Type 2 diabetes mellitus with other diabetic kidney complication: Secondary | ICD-10-CM | POA: Diagnosis not present

## 2020-10-17 DIAGNOSIS — H538 Other visual disturbances: Secondary | ICD-10-CM

## 2020-10-17 DIAGNOSIS — R809 Proteinuria, unspecified: Secondary | ICD-10-CM

## 2020-10-17 DIAGNOSIS — R600 Localized edema: Secondary | ICD-10-CM | POA: Diagnosis not present

## 2020-10-17 NOTE — Patient Instructions (Addendum)
Your blood pressure is very high.  Please take your medications today as soon as you return home. Please check your blood pressure at least 2-3 times a week and record the readings.  Bring those readings with you when you see the clinical pharmacist in 1 week for repeat blood pressure check. Please bring all of your medications with you on that visit.  Stop Lyrica for now.  This can sometimes cause swelling in the legs.  If the swelling does not improve after stopping the Lyrica, we will increase the furosemide which is the fluid pill.

## 2020-10-17 NOTE — Progress Notes (Signed)
Pt states his feet are tight

## 2020-10-17 NOTE — Progress Notes (Signed)
Patient ID: Douglas Briggs, male    DOB: 07-01-1960  MRN: [id number]  CC: Blood Pressure Check and Leg Swelling   Subjective: Douglas Briggs is a 60 y.o. 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  He had not taken medicines as yet.  He was given a dose of clonidine.  HCTZ was discontinued and he was started on furosemide instead due to lower extremity edema.   -Since then he reports compliance with medications and low-salt diet.  He has not taken his medicines as yet for the morning. Has device at home but not checking BP Reports he takes Lyrica 25  Mg BID for diabetic neuropathy. No CP/SOB at rest or exertion. Reports he is farily active.  No PND/orthopnea or palpitations.  Had loop recorder placed since last visit.  Recent echo reveals good heart function.  Last albumin level was 2.8.  He does not have a recent urine microalbumin.  Reports some blurred vision at times x several mths.  Poor night time vision.  Requests referral for eye exam.  Patient Active Problem List   Diagnosis Date Noted  . Hypertension   . Neuropathy   . Protein-calorie malnutrition, mild ([place]) 09/11/2020  . Hyperlipidemia associated with type 2 diabetes mellitus ([place]) 09/11/2020  . Diabetes mellitus due to underlying condition with retinopathy of both eyes ([place])   . Hypertension associated with diabetes ([place])   . NPDR (nonproliferative diabetic retinopathy) ([place]) 02/09/2020  . Influenza vaccination declined 01/20/2020  . Ulcer of left lower extremity with fat layer exposed ([place]) 01/20/2020  . Cellulitis  12/19/2019  . Dry gangrene ([place]) 12/19/2019  . HLD (hyperlipidemia)   . Normocytic anemia   . CKD (chronic kidney disease)   . Essential hypertension 11/22/2019  . Atherosclerotic vascular disease 11/22/2019  . Acute CVA (cerebrovascular accident) ([place]) 10/30/2019  . TIA (transient ischemic attack) 10/29/2019  . Stroke-like symptoms 10/29/2019  . Right sided numbness   . Acquired hammer toe of left foot 03/12/2017  . Tinea pedis of both feet 03/12/2017  . Male hypogonadism 11/14/2016  . Restless leg syndrome 11/14/2016  . Uncontrolled type 2 diabetes mellitus with diabetic polyneuropathy, without long-term current use of insulin ([place]) 11/12/2016     Current Outpatient Medications on File Prior to Visit  Medication Sig Dispense Refill  . aspirin EC 81 MG tablet Take 1 tablet (81 mg total) by mouth daily. 150 tablet 2  . atorvastatin (LIPITOR) 40 MG tablet Take 80 mg by mouth 2 (two) times daily.    . carvedilol (COREG) 3.125 MG tablet Take 1 tablet (3.125 mg total) by mouth 2 (two) times daily with a meal. 60 tablet 6  . cloNIDine (CATAPRES) 0.1 MG tablet Take 1 tablet (0.1 mg total) by mouth 2 (two) times daily. 60 tablet 11  . ezetimibe (ZETIA) 10 MG tablet Take 10 mg by mouth daily.    . famotidine (PEPCID) 20 MG tablet Take 20 mg by mouth 2 (two) times daily as needed for heartburn or indigestion.    . furosemide (LASIX) 20 MG tablet Take 1 tablet (20 mg total) by mouth daily.  30 tablet 4  . glimepiride (AMARYL) 1 MG tablet Take 1 tablet (1 mg total) by mouth daily with breakfast. 30 tablet 6  . losartan (COZAAR) 100 MG tablet Take 1 tablet (100 mg total) by mouth daily. 30 tablet 6  . metFORMIN (GLUCOPHAGE) 500 MG tablet Take 1 tablet (500 mg total) by mouth at bedtime. 90 tablet 1  . Multiple Vitamin (MULTIVITAMIN WITH MINERALS) TABS tablet Take 1 tablet by mouth daily with breakfast.    . ondansetron (ZOFRAN ODT) 4 MG disintegrating tablet Take 1 tablet (4 mg total) by mouth  every 8 (eight) hours as needed for nausea or vomiting. 20 tablet 0  . VITAMIN D PO Take 1 tablet by mouth daily with breakfast.      No current facility-administered medications on file prior to visit.    No Known Allergies  Social History   Socioeconomic History  . Marital status: Single    Spouse name: Not on file  . Number of children: Not on file  . Years of education: Not on file  . Highest education level: Not on file  Occupational History  . Not on file  Tobacco Use  . Smoking status: Never Smoker  . Smokeless tobacco: Never Used  Vaping Use  . Vaping Use: Never used  Substance and Sexual Activity  . Alcohol use: No  . Drug use: Never  . Sexual activity: Not on file  Other Topics Concern  . Not on file  Social History Narrative  . Not on file   Social Determinants of Health   Financial Resource Strain: Not on file  Food Insecurity: Not on file  Transportation Needs: Not on file  Physical Activity: Not on file  Stress: Not on file  Social Connections: Not on file  Intimate Partner Violence: Not on file    Family History  Problem Relation Age of Onset  . Hypertension Father   . Cancer Father   . Diabetes Paternal Aunt   . Heart disease Neg Hx     Past Surgical History:  Procedure Laterality Date  . AMPUTATION Left 12/20/2019   Procedure: AMPUTATION 5TH TOE;  Surgeon: Erle Crocker, MD;  Location: [facility];  Service: Orthopedics;  Laterality: Left;  . BUBBLE STUDY  09/22/2020   Procedure: BUBBLE STUDY;  Surgeon: Donato Heinz, MD;  Location: [facility];  Service: Cardiovascular;;  . LOOP RECORDER INSERTION N/A 09/22/2020   Procedure: LOOP RECORDER INSERTION;  Surgeon: Constance Haw, MD;  Location: [facility];  Service: Cardiovascular;  Laterality: N/A;  . TEE WITHOUT CARDIOVERSION N/A 09/22/2020   Procedure: TRANSESOPHAGEAL ECHOCARDIOGRAM (TEE);  Surgeon: Donato Heinz, MD;  [facility] ENDOSCOPY;  Service:  Cardiovascular;  Laterality: N/A;  LOOP    ROS: Review of Systems Negative except as stated above  PHYSICAL EXAM: BP (!) 196/85   Pulse (!) 58   Resp 16   Wt 162 lb 12.8 oz (73.8 kg)   SpO2 100%   BMI 25.12 kg/m   Wt Readings from Last 3 Encounters:  10/17/20 162 lb 12.8 oz (73.8 kg)  09/11/20 150 lb 9.6 oz (68.3 kg)  09/06/20 159 lb (72.1 kg)   Repeat blood pressure 197/100 Physical Exam General appearance - alert, well appearing, and in no distress Mental status - normal mood, behavior, speech, dress, motor activity, and thought processes Chest - clear to auscultation, no wheezes, rales or rhonchi, symmetric air entry Heart - normal rate, regular rhythm, normal S1, S2, no murmurs,  rubs, clicks or gallops Extremities -1+ bilateral lower extremity edema from about the mid calf down  CMP Latest Ref Rng & Units 09/11/2020 09/06/2020 04/14/2020  Glucose 65 - 99 mg/dL - 95 96  BUN 8 - 27 mg/dL - 13 26(H)  Creatinine 0.76 - 1.27 mg/dL - 1.75(H) 1.56(H)  Sodium 134 - 144 mmol/L - 139 141  Potassium 3.5 - 5.2 mmol/L - 4.4 4.9  Chloride 96 - 106 mmol/L - 107(H) 107(H)  CO2 20 - 29 mmol/L - 21 25  Calcium 8.6 - 10.2 mg/dL - 8.3(L) 9.2  Total Protein 6.0 - 8.5 g/dL 5.6(L) - -  Total Bilirubin 0.0 - 1.2 mg/dL <0.2 - -  Alkaline Phos 44 - 121 IU/L 147(H) - -  AST 0 - 40 IU/L 15 - -  ALT 0 - 44 IU/L 23 - -   Lipid Panel     Component Value Date/Time   CHOL 203 (H) 04/05/2020 1019   TRIG 116 04/05/2020 1019   HDL 54 04/05/2020 1019   CHOLHDL 3.8 04/05/2020 1019   CHOLHDL 4.4 12/21/2019 0123   VLDL 23 12/21/2019 0123   LDLCALC 128 (H) 04/05/2020 1019    CBC    Component Value Date/Time   WBC 4.2 09/06/2020 1327   WBC 5.6 02/18/2020 1355   RBC 4.08 (L) 09/06/2020 1327   RBC 3.99 (L) 02/18/2020 1355   HGB 11.1 (L) 09/06/2020 1327   HCT 34.6 (L) 09/06/2020 1327   PLT 164 09/06/2020 1327   MCV 85 09/06/2020 1327   MCH 27.2 09/06/2020 1327   MCH 27.3 02/18/2020 1355    MCHC 32.1 09/06/2020 1327   MCHC 33.2 02/18/2020 1355   RDW 14.2 09/06/2020 1327   LYMPHSABS 1.3 12/22/2019 0424   MONOABS 0.5 12/22/2019 0424   EOSABS 0.2 12/22/2019 0424   BASOSABS 0.0 12/22/2019 0424    ASSESSMENT AND PLAN: 1. Essential hypertension Not at goal.  Patient has not taken medicines as yet for the morning.  I request that he takes his medications as often as he returns home.  Check blood pressure at least 2-3 times a week and record the readings. -Follow-up with clinical pharmacist in 1 week for repeat blood pressure check.  2. Edema of both legs Stop Lyrica for now.  Check urine microalbumin to make sure he does not have nephrotic range proteinuria.  Continue current dose of furosemide. - Brain natriuretic peptide  3. Blurred vision - Ambulatory referral to Ophthalmology  4. Type 2 diabetes mellitus with microalbuminuria, without long-term current use of insulin ([place]) - Ambulatory referral to Ophthalmology - Microalbumin / creatinine urine ratio   Patient was given the opportunity to ask questions.  Patient verbalized understanding of the plan and was able to repeat key elements of the plan.   Orders Placed This Encounter  Procedures  . Brain natriuretic peptide  . Microalbumin / creatinine urine ratio  . Ambulatory referral to Ophthalmology     Requested Prescriptions    No prescriptions requested or ordered in this encounter    Return in about 3 weeks (around 11/07/2020) for f/u with [facility] in 1 wk for BP recheck.  Karle Plumber, MD, FACP

## 2020-10-18 ENCOUNTER — Other Ambulatory Visit: Payer: Self-pay | Admitting: Internal Medicine

## 2020-10-18 ENCOUNTER — Telehealth: Payer: Self-pay

## 2020-10-18 DIAGNOSIS — N1832 Chronic kidney disease, stage 3b: Secondary | ICD-10-CM

## 2020-10-18 DIAGNOSIS — E1122 Type 2 diabetes mellitus with diabetic chronic kidney disease: Secondary | ICD-10-CM

## 2020-10-18 DIAGNOSIS — R809 Proteinuria, unspecified: Secondary | ICD-10-CM

## 2020-10-18 NOTE — Progress Notes (Signed)
Let patient know that he has a large amount of protein in the urine indicating that diabetes is adversely affecting his kidneys. Let him know that I will need to refer him to a kidney specialist for further evaluation. Referral submitted.

## 2020-10-18 NOTE — Telephone Encounter (Signed)
Contacted pt to go over lab results pt is aware and doesn't have any questions or concerns 

## 2020-10-19 LAB — MICROALBUMIN / CREATININE URINE RATIO
Creatinine, Urine: 86.2 mg/dL
Microalb/Creat Ratio: 3983 mg/g creat — ABNORMAL HIGH (ref 0–29)
Microalbumin, Urine: 3433.1 ug/mL

## 2020-10-19 LAB — BRAIN NATRIURETIC PEPTIDE: BNP: 25 pg/mL (ref 0.0–100.0)

## 2020-10-24 DIAGNOSIS — I639 Cerebral infarction, unspecified: Secondary | ICD-10-CM | POA: Insufficient documentation

## 2020-10-25 ENCOUNTER — Other Ambulatory Visit: Payer: Self-pay

## 2020-10-25 DIAGNOSIS — I739 Peripheral vascular disease, unspecified: Secondary | ICD-10-CM

## 2020-10-26 ENCOUNTER — Encounter: Payer: Self-pay | Admitting: Cardiology

## 2020-10-26 ENCOUNTER — Ambulatory Visit: Payer: Medicaid Other | Admitting: Cardiology

## 2020-10-26 ENCOUNTER — Other Ambulatory Visit: Payer: Self-pay

## 2020-10-26 VITALS — BP 170/78 | HR 60 | Ht 68.0 in | Wt 156.1 lb

## 2020-10-26 DIAGNOSIS — I7090 Unspecified atherosclerosis: Secondary | ICD-10-CM | POA: Diagnosis not present

## 2020-10-26 DIAGNOSIS — E08311 Diabetes mellitus due to underlying condition with unspecified diabetic retinopathy with macular edema: Secondary | ICD-10-CM

## 2020-10-26 DIAGNOSIS — I10 Essential (primary) hypertension: Secondary | ICD-10-CM

## 2020-10-26 DIAGNOSIS — E782 Mixed hyperlipidemia: Secondary | ICD-10-CM

## 2020-10-26 MED ORDER — CHLORTHALIDONE 25 MG PO TABS: 25.0000 mg | ORAL_TABLET | Freq: Every day | ORAL | 3 refills | Status: DC

## 2020-10-26 NOTE — Progress Notes (Signed)
Cardiology Office Note:    Date:  10/26/2020   ID:  Douglas Briggs, DOB 11-10-1960, MRN [id number]  PCP:  Ladell Pier, MD  Cardiologist:  Jenean Lindau, MD   Referring MD: Ladell Pier, MD    ASSESSMENT:    1. Atherosclerotic vascular disease   2. Essential hypertension   3. Mixed hyperlipidemia   4. 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 Atherosclerotic vascular disease: Secondary prevention stressed with the patient.  Importance of compliance with diet medication stressed and he vocalized understanding.  He has significant vascular disease and cerebrovascular circulation and I reviewed hospital records.  He is post stroke. 2. Essential hypertension: Lifestyle modification and diet was emphasized.  He understands.  Salt intake issues were discussed.  In view of this I will stop fluids, start chlorthalidone 2025 mg daily.  He will keep a track of his blood pressures and come back for Chem-7 in 2 weeks. 3. History of stroke: Loop recorder insertion monitored by our electrophysiology colleagues. 4. Mixed dyslipidemia: Diet emphasized.  We will check his lipids when he comes to see [place] in follow-up in 4 months.  Patient is on statin therapy. 5. Renal insufficiency: Will be monitored closely especially with the fact that he is on diuretics. 6. Patient will be seen in follow-up appointment in 4 weeks or earlier if the patient has any concerns    Medication Adjustments/Labs and Tests Ordered: Current medicines are reviewed at length with the patient today.  Concerns regarding medicines are outlined above.  No orders of the defined types were placed in this encounter.  No orders of the defined types were placed in this encounter.    No chief complaint on file.    History of  Present Illness:    Douglas Briggs is a 60 y.o. male.  Patient has past medical history of atherosclerotic vascular disease, essential hypertension, mixed dyslipidemia and diabetes mellitus.  The patient was admitted to the hospital several weeks ago and was evaluated.  Loop recorder is in place for any arrhythmia as a cause of his issues.  Currently he denies chest pain orthopnea PND and is working on a regular basis.  At the time of my evaluation, the patient is alert awake oriented and in no distress.  He mentions that his blood pressure is elevated at home also.  Past Medical History:  Diagnosis Date  . Acquired hammer toe of left foot 03/12/2017  . Acute CVA (cerebrovascular accident) ([place]) 10/30/2019  . Atherosclerotic vascular disease 11/22/2019  . Cellulitis 12/19/2019  . CKD (chronic kidney disease)   . Diabetes mellitus due to underlying condition with retinopathy of both eyes ([place])   . Dry gangrene ([place]) 12/19/2019  . Essential hypertension 11/22/2019  . HLD (hyperlipidemia)   . Hyperlipidemia associated with type 2 diabetes mellitus ([place]) 09/11/2020  . Hypertension   . Hypertension associated with diabetes ([place])   . Influenza vaccination declined 01/20/2020  . Male hypogonadism 11/14/2016  . Neuropathy   . Normocytic anemia   . NPDR (nonproliferative diabetic retinopathy) ([place]) 02/09/2020   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 Dr. Tacy Dura - seen 11/24/19  . Protein-calorie malnutrition, mild ([place]) 09/11/2020  . Restless leg syndrome 11/14/2016  . Right sided numbness   . Stroke ([place])   . Stroke-like symptoms  10/29/2019  . TIA (transient ischemic attack) 10/29/2019  . Tinea pedis of both feet 03/12/2017  . Ulcer of left lower extremity with fat layer exposed ([place]) 01/20/2020  . Uncontrolled type 2 diabetes mellitus with diabetic polyneuropathy, without long-term current use of insulin ([place]) 11/12/2016    Past Surgical History:  Procedure Laterality Date  . AMPUTATION Left 12/20/2019    Procedure: AMPUTATION 5TH TOE;  Surgeon: Erle Crocker, MD;  Location: [facility];  Service: Orthopedics;  Laterality: Left;  . BUBBLE STUDY  09/22/2020   Procedure: BUBBLE STUDY;  Surgeon: Donato Heinz, MD;  Location: [place];  Service: Cardiovascular;;  . LOOP RECORDER INSERTION N/A 09/22/2020   Procedure: LOOP RECORDER INSERTION;  Surgeon: Constance Haw, MD;  Location: [facility] CV LAB;  Service: Cardiovascular;  Laterality: N/A;  . TEE WITHOUT CARDIOVERSION N/A 09/22/2020   Procedure: TRANSESOPHAGEAL ECHOCARDIOGRAM (TEE);  Surgeon: Donato Heinz, MD;  Location: [facility] ENDOSCOPY;  Service: Cardiovascular;  Laterality: N/A;  LOOP    Current Medications: Current Meds  Medication Sig  . aspirin EC 81 MG tablet Take 1 tablet (81 mg total) by mouth daily.  Marland Kitchen atorvastatin (LIPITOR) 40 MG tablet Take 80 mg by mouth 2 (two) times daily.  . carvedilol (COREG) 3.125 MG tablet Take 1 tablet (3.125 mg total) by mouth 2 (two) times daily with a meal.  . cloNIDine (CATAPRES) 0.1 MG tablet Take 1 tablet (0.1 mg total) by mouth 2 (two) times daily.  Marland Kitchen ezetimibe (ZETIA) 10 MG tablet Take 10 mg by mouth daily.  . famotidine (PEPCID) 20 MG tablet Take 20 mg by mouth 2 (two) times daily as needed for heartburn or indigestion.  . furosemide (LASIX) 20 MG tablet Take 1 tablet (20 mg total) by mouth daily.  Marland Kitchen glimepiride (AMARYL) 1 MG tablet Take 1 tablet (1 mg total) by mouth daily with breakfast.  . losartan (COZAAR) 100 MG tablet Take 1 tablet (100 mg total) by mouth daily.  . metFORMIN (GLUCOPHAGE) 500 MG tablet Take 1 tablet (500 mg total) by mouth at bedtime.  . Multiple Vitamin (MULTIVITAMIN WITH MINERALS) TABS tablet Take 1 tablet by mouth daily with breakfast.  . ondansetron (ZOFRAN ODT) 4 MG disintegrating tablet Take 1 tablet (4 mg total) by mouth every 8 (eight) hours as needed for nausea or vomiting.  Marland Kitchen VITAMIN D PO Take 1 tablet by mouth daily with breakfast.       Allergies:   Patient has no known allergies.   Social History   Socioeconomic History  . Marital status: Single    Spouse name: Not on file  . Number of children: Not on file  . Years of education: Not on file  . Highest education level: Not on file  Occupational History  . Not on file  Tobacco Use  . Smoking status: Never Smoker  . Smokeless tobacco: Never Used  Vaping Use  . Vaping Use: Never used  Substance and Sexual Activity  . Alcohol use: No  . Drug use: Never  . Sexual activity: Not on file  Other Topics Concern  . Not on file  Social History Narrative  . Not on file   Social Determinants of Health   Financial Resource Strain: Not on file  Food Insecurity: Not on file  Transportation Needs: Not on file  Physical Activity: Not on file  Stress: Not on file  Social Connections: Not on file     Family History: The patient's family history includes Cancer in  his father; Diabetes in his paternal aunt; Hypertension in his father. There is no history of Heart disease.  ROS:   Please see the history of present illness.    All other systems reviewed and are negative.  EKGs/Labs/Other Studies Reviewed:    The following studies were reviewed today: I reviewed hospital records.  So far loop recorder has been unremarkable.   Recent Labs: 12/22/2019: Magnesium 2.2 09/06/2020: BUN 13; Creatinine, Ser 1.75; Hemoglobin 11.1; Platelets 164; Potassium 4.4; Sodium 139 09/11/2020: ALT 23; TSH 2.300 10/17/2020: BNP 25.0  Recent Lipid Panel    Component Value Date/Time   CHOL 203 (H) 04/05/2020 1019   TRIG 116 04/05/2020 1019   HDL 54 04/05/2020 1019   CHOLHDL 3.8 04/05/2020 1019   CHOLHDL 4.4 12/21/2019 0123   VLDL 23 12/21/2019 0123   LDLCALC 128 (H) 04/05/2020 1019    Physical Exam:    VS:  BP (!) 170/78   Pulse 60   Ht '5\' 8"'$  (1.727 m)   Wt 156 lb 1.3 oz (70.8 kg)   SpO2 99%   BMI 23.73 kg/m     Wt Readings from Last 3 Encounters:  10/26/20 156 lb 1.3  oz (70.8 kg)  10/17/20 162 lb 12.8 oz (73.8 kg)  09/11/20 150 lb 9.6 oz (68.3 kg)     GEN: Patient is in no acute distress HEENT: Normal NECK: No JVD; No carotid bruits LYMPHATICS: No lymphadenopathy CARDIAC: Hear sounds regular, 2/6 systolic murmur at the apex. RESPIRATORY:  Clear to auscultation without rales, wheezing or rhonchi  ABDOMEN: Soft, non-tender, non-distended MUSCULOSKELETAL:  No edema; No deformity  SKIN: Warm and dry NEUROLOGIC:  Alert and oriented x 3 PSYCHIATRIC:  Normal affect   Signed, Jenean Lindau, MD  10/26/2020 10:50 AM    [place]

## 2020-10-26 NOTE — Patient Instructions (Signed)
Medication Instructions:  Your physician has recommended you make the following change in your medication:   Stop Lasix Start chlorthalidone 25 mg daily.  *If you need a refill on your cardiac medications before your next appointment, please call your pharmacy*   Lab Work: Your physician recommends that you have a BMET, lft and lipids today in the office.  Your physician recommends that you return for lab work in: 2 weeks You can come Monday through Friday 8:30 am to 12:00 pm and 1:15 to 4:30. You do not need to make an appointment as the order has already been placed. This will be for a BMP.   If you have labs (blood work) drawn today and your tests are completely normal, you will receive your results only by: Marland Kitchen MyChart Message (if you have MyChart) OR . A paper copy in the mail If you have any lab test that is abnormal or we need to change your treatment, we will call you to review the results.   Testing/Procedures: None ordered   Follow-Up: At [facility], you and your health needs are our priority.  As part of our continuing mission to provide you with exceptional heart care, we have created designated Provider Care Teams.  These Care Teams include your primary Cardiologist (physician) and Advanced Practice Providers (APPs -  Physician Assistants and Nurse Practitioners) who all work together to provide you with the care you need, when you need it.  We recommend signing up for the patient portal called "MyChart".  Sign up information is provided on this After Visit Summary.  MyChart is used to connect with patients for Virtual Visits (Telemedicine).  Patients are able to view lab/test results, encounter notes, upcoming appointments, etc.  Non-urgent messages can be sent to your provider as well.   To learn more about what you can do with MyChart, go to [email or web address].    Your next appointment:   1 month(s)  The format for your next appointment:   In  Person  Provider:   Jyl Heinz, MD   Other Instructions Chlorthalidone Oral Tablets What is this medicine? CHLORTHALIDONE (klor THAL i done) is a diuretic. It helps you make more urine and to lose salt and excess water from your body. It treats swelling from heart, kidney, or liver disease. It also treats high blood pressure. This medicine may be used for other purposes; ask your health care provider or pharmacist if you have questions. COMMON [place] NAME(S): Thalitone What should I tell my health care provider before I take this medicine? They need to know if you have any of these conditions:  diabetes, high blood sugar  gout  kidney disease  liver disease  lung or breathing disease (asthma)  lupus  an unusual or allergic reaction to chlorthalidone, sulfa drugs, other drugs, foods, dyes or preservatives  pregnant or trying to get pregnant  breast-feeding How should I use this medicine? Take this drug by mouth. Take it as directed on the prescription label at the same time every day. Take it with food. Keep taking it unless your health care provider tells you to stop. Talk to your health care provider about the use of this drug in children. Special care may be needed. Overdosage: If you think you have taken too much of this medicine contact a poison control center or emergency room at once. NOTE: This medicine is only for you. Do not share this medicine with others. What if I miss a dose? If you miss a  dose, take it as soon as you can. If it is almost time for your next dose, take only that dose. Do not take double or extra doses. What may interact with this medicine?  barbiturate medicines for sleep or seizure control  digoxin  lithium  medicines for diabetes  norepinephrine  other medicines for high blood pressure  some pain medicines  steroid hormones like prednisone, cortisone, hydrocortisone, corticotropin  tubocurarine This list may not describe all  possible interactions. Give your health care provider a list of all the medicines, herbs, non-prescription drugs, or dietary supplements you use. Also tell them if you smoke, drink alcohol, or use illegal drugs. Some items may interact with your medicine. What should I watch for while using this medicine? Visit your health care provider for regular checks on your progress. Check your blood pressure as directed. Ask your health care provider what your blood pressure should be. Also, find out when you should contact him or her. Check with your health care provider if you have severe diarrhea, nausea, and vomiting, or if you sweat a lot. The loss of too much body fluid may make it dangerous for you to take this drug. You may need to be on a special diet while you are taking this drug. Ask your health care provider. Also, find out how many glasses of fluids you need to drink each day. Do not treat yourself for coughs, colds, or pain while you are using this drug without asking your health care provider for advice. Some drugs may increase your blood pressure. You may get drowsy or dizzy. Do not drive, use machinery, or do anything that needs mental alertness until you know how this drug affects you. Do not stand up or sit up quickly, especially if you are an older patient. This reduces the risk of dizzy or fainting spells. Alcohol may interfere with the effect of this drug. Avoid alcoholic drinks. What side effects may I notice from receiving this medicine? Side effects that you should report to your doctor or health care provider as soon as possible:  allergic reactions (skin rash, itching or hives; swelling of the face, lips, or tongue)  kidney injury (trouble passing urine or change in the amount of urine)  low blood pressure (dizziness; feeling faint or lightheaded, falls; unusually weak or tired)  low potassium levels (trouble breathing; chest pain; dizziness; fast, irregular heartbeat; feeling faint  or lightheaded, falls; muscle cramps or pain)  redness, blistering, peeling, or loosening of the skin, including inside the mouth  unusual bruising or bleeding Side effects that usually do not require medical attention (report to your doctor or health care provider if they continue or are bothersome):  constipation  diarrhea  headache  increased thirst  loss of appetite  low red blood cell counts (trouble breathing; feeling faint; lightheaded, falls; unusually weak or tired)  unusual sweating  vomiting This list may not describe all possible side effects. Call your doctor for medical advice about side effects. You may report side effects to FDA at 1-800-FDA-1088. Where should I keep my medicine? Keep out of the reach of children and pets. Store at room temperature between 20 and 25 degrees C (68 and 77 degrees F). Protect from light. Throw away any unused drug after the expiration date. NOTE: This sheet is a summary. It may not cover all possible information. If you have questions about this medicine, talk to your doctor, pharmacist, or health care provider.  2021 Elsevier/Gold Standard (2019-03-02 14:45:31)  Blood Pressure Record Sheet To take your blood pressure, you will need a blood pressure machine. You can buy a blood pressure machine (blood pressure monitor) at your clinic, drug store, or online. When choosing one, consider:  An automatic monitor that has an arm cuff.  A cuff that wraps snugly around your upper arm. You should be able to fit only one finger between your arm and the cuff.  A device that stores blood pressure reading results.  Do not choose a monitor that measures your blood pressure from your wrist or finger. Follow your health care provider's instructions for how to take your blood pressure. To use this form:  Get one reading in the morning (a.m.) 1-2 hours after you take any medicines.  Get one reading in the evening (p.m.) before supper.  Take  at least 2 readings with each blood pressure check. This makes sure the results are correct. Wait 1-2 minutes between measurements.  Write down the results in the spaces on this form.  Repeat this once a week, or as told by your health care provider.    Make a follow-up appointment with your health care provider to discuss the results. Blood pressure log Date: _______________________  a.m. _____________________(1st reading) _____________________(2nd reading)  p.m. _____________________(1st reading) _____________________(2nd reading) Date: _______________________  a.m. _____________________(1st reading) _____________________(2nd reading)  p.m. _____________________(1st reading) _____________________(2nd reading) Date: _______________________  a.m. _____________________(1st reading) _____________________(2nd reading)  p.m. _____________________(1st reading) _____________________(2nd reading) Date: _______________________  a.m. _____________________(1st reading) _____________________(2nd reading)  p.m. _____________________(1st reading) _____________________(2nd reading) Date: _______________________  a.m. _____________________(1st reading) _____________________(2nd reading)  p.m. _____________________(1st reading) _____________________(2nd reading) This information is not intended to replace advice given to you by your health care provider. Make sure you discuss any questions you have with your health care provider. Document Revised: 09/01/2019 Document Reviewed: 09/01/2019 Elsevier Patient Education  2021 [employer].

## 2020-10-27 LAB — HEPATIC FUNCTION PANEL
ALT: 16 IU/L (ref 0–44)
AST: 12 IU/L (ref 0–40)
Albumin: 2.7 g/dL — ABNORMAL LOW (ref 3.8–4.9)
Alkaline Phosphatase: 120 IU/L (ref 44–121)
Bilirubin Total: <0.2 mg/dL (ref 0.0–1.2)
Bilirubin, Direct: <0.1 mg/dL (ref 0.00–0.40)
Total Protein: 5.5 g/dL — ABNORMAL LOW (ref 6.0–8.5)

## 2020-10-27 LAB — BASIC METABOLIC PANEL
BUN/Creatinine Ratio: 13 (ref 10–24)
BUN: 26 mg/dL (ref 8–27)
CO2: 21 mmol/L (ref 20–29)
Calcium: 8.6 mg/dL (ref 8.6–10.2)
Chloride: 108 mmol/L — ABNORMAL HIGH (ref 96–106)
Creatinine, Ser: 1.99 mg/dL — ABNORMAL HIGH (ref 0.76–1.27)
Glucose: 105 mg/dL — ABNORMAL HIGH (ref 65–99)
Potassium: 5.5 mmol/L — ABNORMAL HIGH (ref 3.5–5.2)
Sodium: 139 mmol/L (ref 134–144)
eGFR: 38 mL/min/{1.73_m2} — ABNORMAL LOW (ref >59)

## 2020-10-27 LAB — LIPID PANEL
Chol/HDL Ratio: 5.5 ratio — ABNORMAL HIGH (ref 0.0–5.0)
Cholesterol, Total: 404 mg/dL — ABNORMAL HIGH (ref 100–199)
HDL: 74 mg/dL (ref >39)
LDL Chol Calc (NIH): 291 mg/dL — ABNORMAL HIGH (ref 0–99)
Triglycerides: 190 mg/dL — ABNORMAL HIGH (ref 0–149)
VLDL Cholesterol Cal: 39 mg/dL (ref 5–40)

## 2020-10-28 ENCOUNTER — Other Ambulatory Visit: Payer: Self-pay | Admitting: Internal Medicine

## 2020-10-28 DIAGNOSIS — E875 Hyperkalemia: Secondary | ICD-10-CM

## 2020-10-28 NOTE — Progress Notes (Signed)
Let patient know that I received the results of the lab tests that were ordered by his cardiologist.  His potassium level is elevated.  He was started on a blood pressure medication called chlorthalidone by the cardiologist.  This should help to lower the potassium level.  When he comes to see the clinical pharmacist next week, he should stop at the lab to have this potassium level rechecked.

## 2020-10-30 ENCOUNTER — Telehealth: Payer: Self-pay

## 2020-10-30 NOTE — Telephone Encounter (Signed)
Contacted pt to go over lab results pt didn't answer and was unable to lvm

## 2020-10-31 ENCOUNTER — Ambulatory Visit: Payer: Medicaid Other | Attending: Internal Medicine | Admitting: Pharmacist

## 2020-10-31 ENCOUNTER — Encounter: Payer: Self-pay | Admitting: Pharmacist

## 2020-10-31 ENCOUNTER — Other Ambulatory Visit: Payer: Self-pay

## 2020-10-31 DIAGNOSIS — I10 Essential (primary) hypertension: Secondary | ICD-10-CM | POA: Diagnosis not present

## 2020-10-31 DIAGNOSIS — E875 Hyperkalemia: Secondary | ICD-10-CM

## 2020-10-31 DIAGNOSIS — E1142 Type 2 diabetes mellitus with diabetic polyneuropathy: Secondary | ICD-10-CM | POA: Diagnosis not present

## 2020-10-31 MED ORDER — GLIMEPIRIDE 1 MG PO TABS: 1.0000 mg | ORAL_TABLET | Freq: Every day | ORAL | 6 refills | Status: DC

## 2020-10-31 MED ORDER — CARVEDILOL 3.125 MG PO TABS: 3.1250 mg | ORAL_TABLET | Freq: Two times a day (BID) | ORAL | 6 refills | Status: DC

## 2020-10-31 MED ORDER — LOSARTAN POTASSIUM 100 MG PO TABS: 100.0000 mg | ORAL_TABLET | Freq: Every day | ORAL | 6 refills | Status: DC

## 2020-10-31 NOTE — Progress Notes (Signed)
   S:    Patient arrives well and in good spirits.    Presents to the clinic for hypertension evaluation, counseling, and management. Patient seen by Dr. Wynetta Emery on 10/17/20 for hypertension management. BP was 196/85 with P of 58. Had not taken medication yet that morning. Lasix was started due to LL edema.   Since then, pt was seen on 10/26/20 by Dr. Geraldo Pitter. BP was 170/78. Lasix stopped, and chlorthalidone started. Labs on 6/2 showed an elevated Scr at 1.99 and hyperkalemia with a K+ of 5.5.   Today, patient reports adherence to all BP medications. Takes losartan around 11 AM and chlorthalidone in the evening. Denies dizziness, chest pain, headaches. Worried about vision, has an eye appt next week. Not checking BP at home. Rechecking BMP today.   Current BP Medications include:  Carvedilol 3.125 mg BID, chlorthalidone 25 mg once daily, clonidine 0.1 mg BID, losartan 100 mg once daily  Antihypertensives tried in the past include: as above  Dietary habits include: improving, no caffeine  Family / Social history: non-smoker   O:  Vitals:   10/31/20 1625  BP: (!) 158/91  Pulse: 74   Last 3 Office BP readings: BP Readings from Last 3 Encounters:  10/31/20 (!) 158/91  10/26/20 (!) 170/78  10/17/20 (!) 196/85   BMET    Component Value Date/Time   NA 139 10/26/2020 1058   K 5.5 (H) 10/26/2020 1058   CL 108 (H) 10/26/2020 1058   CO2 21 10/26/2020 1058   GLUCOSE 105 (H) 10/26/2020 1058   GLUCOSE 157 (H) 02/18/2020 1355   BUN 26 10/26/2020 1058   CREATININE 1.99 (H) 10/26/2020 1058   CALCIUM 8.6 10/26/2020 1058   GFRNONAA 48 (L) 04/14/2020 1543   GFRAA 55 (L) 04/14/2020 1543    Renal function: Estimated Creatinine Clearance: 38.2 mL/min (A) (by C-G formula based on SCr of 1.99 mg/dL (H)).  Clinical ASCVD: Yes  The ASCVD Risk score Mikey Bussing DC Jr., et al., 2013) failed to calculate for the following reasons:   The patient has a prior MI or stroke diagnosis   A/P: Hypertension  longstanding currently above goal on current medications. BP Goal = < 130/80 mmHg. Medication adherence reported. Rechecking BMP today to assess renal function before making any changes - continuing current regimen for now.  -Continued current BP regimen.  -F/u labs ordered - BMP -Counseled on lifestyle modifications for blood pressure control including reduced dietary sodium, increased exercise, adequate sleep.  Results reviewed and written information provided.   Total time in face-to-face counseling 20 minutes.   F/U Clinic Visit in 1 month with Dr. Wynetta Emery.  Patient seen with Jakari Jacot.  Harriet Pho, PharmD PGY-1 [facility] Resident   10/31/2020 5:04 PM

## 2020-11-01 ENCOUNTER — Telehealth: Payer: Self-pay | Admitting: Internal Medicine

## 2020-11-01 ENCOUNTER — Encounter: Payer: Self-pay | Admitting: *Deleted

## 2020-11-01 LAB — BASIC METABOLIC PANEL
BUN/Creatinine Ratio: 12 (ref 10–24)
BUN: 24 mg/dL (ref 8–27)
CO2: 21 mmol/L (ref 20–29)
Calcium: 8.6 mg/dL (ref 8.6–10.2)
Chloride: 112 mmol/L — ABNORMAL HIGH (ref 96–106)
Creatinine, Ser: 2.02 mg/dL — ABNORMAL HIGH (ref 0.76–1.27)
Glucose: 89 mg/dL (ref 65–99)
Potassium: 4.9 mmol/L (ref 3.5–5.2)
Sodium: 142 mmol/L (ref 134–144)
eGFR: 37 mL/min/{1.73_m2} — ABNORMAL LOW (ref >59)

## 2020-11-01 MED ORDER — HYDRALAZINE HCL 25 MG PO TABS: 25.0000 mg | ORAL_TABLET | Freq: Three times a day (TID) | ORAL | 3 refills | Status: DC

## 2020-11-01 NOTE — Telephone Encounter (Signed)
Unable to reach. Patient mail box is full. Will send message via [place].

## 2020-11-01 NOTE — Addendum Note (Signed)
Addended by: Karle Plumber B on: 11/01/2020 03:34 PM   Modules accepted: Orders

## 2020-11-01 NOTE — Telephone Encounter (Signed)
Agreed. He has a follow-up with Cardiology in July as well.

## 2020-11-19 ENCOUNTER — Telehealth: Payer: Self-pay | Admitting: Internal Medicine

## 2020-11-19 NOTE — Telephone Encounter (Signed)
-----   Message from Ena Dawley sent at 11/17/2020  2:03 PM EDT ----- Regarding: Nephrology Good Afternoon Dr  [facility] Consult date 12/15/2020 08:45 AM Seen By Gean Quint  ----- Message ----- From: Ladell Pier, MD Sent: 11/01/2020   9:50 AM EDT To: Ena Dawley  Can you please check to see if he has received nephrology appointment as yet?

## 2020-11-30 ENCOUNTER — Ambulatory Visit: Payer: Medicaid Other | Admitting: Internal Medicine

## 2020-12-11 ENCOUNTER — Ambulatory Visit (INDEPENDENT_AMBULATORY_CARE_PROVIDER_SITE_OTHER): Payer: 59

## 2020-12-11 DIAGNOSIS — I639 Cerebral infarction, unspecified: Secondary | ICD-10-CM | POA: Diagnosis not present

## 2020-12-12 ENCOUNTER — Ambulatory Visit: Payer: Medicaid Other | Admitting: Cardiology

## 2020-12-12 ENCOUNTER — Encounter: Payer: Self-pay | Admitting: Cardiology

## 2020-12-12 ENCOUNTER — Other Ambulatory Visit: Payer: Self-pay

## 2020-12-12 VITALS — BP 176/76 | HR 76 | Ht 68.0 in | Wt 158.1 lb

## 2020-12-12 DIAGNOSIS — E785 Hyperlipidemia, unspecified: Secondary | ICD-10-CM

## 2020-12-12 DIAGNOSIS — N189 Chronic kidney disease, unspecified: Secondary | ICD-10-CM

## 2020-12-12 DIAGNOSIS — E1169 Type 2 diabetes mellitus with other specified complication: Secondary | ICD-10-CM

## 2020-12-12 DIAGNOSIS — E782 Mixed hyperlipidemia: Secondary | ICD-10-CM

## 2020-12-12 DIAGNOSIS — I10 Essential (primary) hypertension: Secondary | ICD-10-CM

## 2020-12-12 LAB — CUP PACEART REMOTE DEVICE CHECK
Date Time Interrogation Session: 20220717200837
Implantable Pulse Generator Implant Date: 20220429

## 2020-12-12 MED ORDER — CLONIDINE HCL 0.1 MG PO TABS: 0.1000 mg | ORAL_TABLET | Freq: Once | ORAL | 11 refills | Status: DC

## 2020-12-12 NOTE — Progress Notes (Signed)
Cardiology Office Note:    Date:  12/12/2020   ID:  [name], DOB [birth year], MRN [id number]  PCP:  Ladell Pier, MD  Cardiologist:  Jenean Lindau, MD   Referring MD: Ladell Pier, MD    ASSESSMENT:    1. Essential hypertension   2. Hyperlipidemia associated with type 2 diabetes mellitus ([place])   3. Chronic kidney disease, unspecified CKD stage    PLAN:    In order of problems listed above:  Primary prevention stressed with the patient.  Importance of compliance with diet and medication stressed any vocalized understanding. Uncontrolled hypertension: Diet was emphasized.  He plans to do better with diet.  I have added clonidine 0.1 mg daily to his regimen.  He will keep a track of his blood pressures and bring it to [place] in a month. Hypokalemia: This has resolved.  We will recheck Chem-7 today. Mixed dyslipidemia: Diet was emphasized.  He will continue atorvastatin 80 mg daily and we will recheck his blood work in 1 month when he comes for follow-up visit. Diabetes mellitus: Managed by primary care physician.  Diet emphasized.   Medication Adjustments/Labs and Tests Ordered: Current medicines are reviewed at length with the patient today.  Concerns regarding medicines are outlined above.  No orders of the defined types were placed in this encounter.  No orders of the defined types were placed in this encounter.    No chief complaint on file.    History of Present Illness:    Douglas Briggs is a 60 y.o. male.  Patient has past medical history of uncontrolled hypertension, mixed dyslipidemia and diabetes mellitus.  He denies any problems at this time and takes care of activities of daily living.  No chest pain orthopnea or PND.  At the time of my evaluation, the patient is alert awake oriented and in no distress.  He has not kept a track of his blood pressures at home.  He tells me that he breaks his blood pressure medications regularly.  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 Dr. Tacy Dura - seen 11/24/19   Protein-calorie malnutrition, mild ([place]) 09/11/2020   Restless leg syndrome 11/14/2016   Right sided numbness    Stroke ([place])    Stroke-like symptoms 10/29/2019   TIA (transient ischemic attack) 10/29/2019   Tinea pedis of both feet 03/12/2017   Ulcer of left lower extremity with fat layer exposed ([place]) 01/20/2020   Uncontrolled type 2 diabetes mellitus with diabetic polyneuropathy, without long-term current use of insulin ([place]) 11/12/2016    Past Surgical History:  Procedure Laterality Date   AMPUTATION Left 12/20/2019   Procedure: AMPUTATION 5TH TOE;  Surgeon: Erle Crocker, MD;  Location: [place];  Service: Orthopedics;  Laterality: Left;   BUBBLE STUDY  09/22/2020   Procedure: BUBBLE STUDY;  Surgeon: Donato Heinz, MD;  Location: [place];  Service: Cardiovascular;;   LOOP RECORDER INSERTION N/A 09/22/2020   Procedure: LOOP RECORDER INSERTION;  Surgeon: Constance Haw, MD;  Location: [place] [facility];  Service: Cardiovascular;  Laterality: N/A;   TEE WITHOUT CARDIOVERSION N/A 09/22/2020   Procedure: TRANSESOPHAGEAL ECHOCARDIOGRAM (TEE);  Surgeon: Donato Heinz, MD;  Location[facility] ENDOSCOPY;   Service: Cardiovascular;  Laterality: N/A;  LOOP    Current Medications: Current Meds  Medication Sig   atorvastatin (LIPITOR) 40 MG tablet Take 80 mg by mouth 2 (two) times daily.   carvedilol (COREG) 3.125 MG tablet Take 1 tablet (3.125 mg total) by mouth 2 (two) times daily with a meal.   chlorthalidone (HYGROTON) 25 MG tablet Take 1 tablet (25 mg total) by mouth daily.   ezetimibe (ZETIA) 10 MG tablet Take 10 mg by mouth daily.   famotidine (PEPCID) 20 MG tablet Take 20 mg by mouth 2 (two) times daily as needed for heartburn or indigestion.   glimepiride (AMARYL) 1 MG tablet Take 1 tablet (1 mg total) by mouth daily with breakfast.   hydrALAZINE (APRESOLINE) 25 MG tablet Take 1 tablet (25 mg total) by mouth 3 (three) times daily. STOP Clonidine   losartan (COZAAR) 100 MG tablet Take 1 tablet (100 mg total) by mouth daily.   metFORMIN (GLUCOPHAGE) 500 MG tablet Take 1 tablet (500 mg total) by mouth at bedtime.   Multiple Vitamin (MULTIVITAMIN WITH MINERALS) TABS tablet Take 1 tablet by mouth daily with breakfast.   ondansetron (ZOFRAN ODT) 4 MG disintegrating tablet Take 1 tablet (4 mg total) by mouth every 8 (eight) hours as needed for nausea or vomiting.   VITAMIN D PO Take 1 tablet by mouth daily with breakfast.      Allergies:   Patient has no known allergies.   Social History   Socioeconomic History   Marital status: Single    Spouse name: Not on file   Number of children: Not on file   Years of education: Not on file   Highest education level: Not on file  Occupational History   Not on file  Tobacco Use   Smoking status: Never   Smokeless tobacco: Never  Vaping Use   Vaping Use: Never used  Substance and Sexual Activity   Alcohol use: No   Drug use: Never   Sexual activity: Not on file  Other Topics Concern   Not on file  Social History Narrative   Not on file   Social Determinants of Health   Financial Resource Strain: Not on file  Food Insecurity: Not on  file  Transportation Needs: Not on file  Physical Activity: Not on file  Stress: Not on file  Social Connections: Not on file     Family History: The patient's family history includes Cancer in his father; Diabetes in his paternal aunt; Hypertension in his father. There is no history of Heart disease.  ROS:   Please see the history of present illness.    All other systems reviewed and are negative.  EKGs/Labs/Other Studies Reviewed:    The following studies were reviewed today: I discussed my findings with the patient at length.   Recent Labs: 12/22/2019: Magnesium 2.2 09/06/2020: Hemoglobin 11.1; Platelets 164 09/11/2020: TSH 2.300 10/17/2020: BNP 25.0 10/26/2020: ALT 16 10/31/2020: BUN 24; Creatinine, Ser 2.02; Potassium 4.9; Sodium 142  Recent Lipid Panel    Component Value Date/Time   CHOL  404 (H) 10/26/2020 1058   TRIG 190 (H) 10/26/2020 1058   HDL 74 10/26/2020 1058   CHOLHDL 5.5 (H) 10/26/2020 1058   CHOLHDL 4.4 12/21/2019 0123   VLDL 23 12/21/2019 0123   LDLCALC 291 (H) 10/26/2020 1058    Physical Exam:    VS:  BP (!) 176/76   Pulse 76   Ht '5\' 8"'$  (1.727 m)   Wt 158 lb 1.9 oz (71.7 kg)   SpO2 98%   BMI 24.04 kg/m     Wt Readings from Last 3 Encounters:  12/12/20 158 lb 1.9 oz (71.7 kg)  10/26/20 156 lb 1.3 oz (70.8 kg)  10/17/20 162 lb 12.8 oz (73.8 kg)     GEN: Patient is in no acute distress HEENT: Normal NECK: No JVD; No carotid bruits LYMPHATICS: No lymphadenopathy CARDIAC: Hear sounds regular, 2/6 systolic murmur at the apex. RESPIRATORY:  Clear to auscultation without rales, wheezing or rhonchi  ABDOMEN: Soft, non-tender, non-distended MUSCULOSKELETAL:  No edema; No deformity  SKIN: Warm and dry NEUROLOGIC:  Alert and oriented x 3 PSYCHIATRIC:  Normal affect   Signed, Jenean Lindau, MD  12/12/2020 10:54 AM    [place]

## 2020-12-12 NOTE — Patient Instructions (Addendum)
Medication Instructions:  Your physician has recommended you make the following change in your medication:   Start Clonidine 0.1 mg daily. Take 80 mg Atorvastatin daily.  *If you need a refill on your cardiac medications before your next appointment, please call your pharmacy*   Lab Work: Your physician recommends that you have a BMET today in the office.  Come fasting at your next appointment so we can check your lipids.  If you have labs (blood work) drawn today and your tests are completely normal, you will receive your results only by: [place] (if you have MyChart) OR A paper copy in the mail If you have any lab test that is abnormal or we need to change your treatment, we will call you to review the results.   Testing/Procedures: None ordered   Follow-Up: At [facility], you and your health needs are our priority.  As part of our continuing mission to provide you with exceptional heart care, we have created designated Provider Care Teams.  These Care Teams include your primary Cardiologist (physician) and Advanced Practice Providers (APPs -  Physician Assistants and Nurse Practitioners) who all work together to provide you with the care you need, when you need it.  We recommend signing up for the patient portal called "MyChart".  Sign up information is provided on this After Visit Summary.  MyChart is used to connect with patients for Virtual Visits (Telemedicine).  Patients are able to view lab/test results, encounter notes, upcoming appointments, etc.  Non-urgent messages can be sent to your provider as well.   To learn more about what you can do with MyChart, go to [email or web address].    Your next appointment:   1 month(s)  The format for your next appointment:   In Person  Provider:   Jyl Heinz, MD   Other Instructions NA

## 2020-12-13 LAB — BASIC METABOLIC PANEL
BUN/Creatinine Ratio: 16 (ref 10–24)
BUN: 31 mg/dL — ABNORMAL HIGH (ref 8–27)
CO2: 18 mmol/L — ABNORMAL LOW (ref 20–29)
Calcium: 8.8 mg/dL (ref 8.6–10.2)
Chloride: 110 mmol/L — ABNORMAL HIGH (ref 96–106)
Creatinine, Ser: 1.93 mg/dL — ABNORMAL HIGH (ref 0.76–1.27)
Glucose: 66 mg/dL (ref 65–99)
Potassium: 5.1 mmol/L (ref 3.5–5.2)
Sodium: 140 mmol/L (ref 134–144)
eGFR: 39 mL/min/{1.73_m2} — ABNORMAL LOW (ref >59)

## 2020-12-22 ENCOUNTER — Other Ambulatory Visit: Payer: Self-pay

## 2020-12-22 ENCOUNTER — Ambulatory Visit: Payer: 59 | Attending: Internal Medicine | Admitting: Internal Medicine

## 2021-01-02 NOTE — Progress Notes (Signed)
Carelink Summary Report / Loop Recorder 

## 2021-01-12 ENCOUNTER — Encounter: Payer: Self-pay | Admitting: Internal Medicine

## 2021-01-12 ENCOUNTER — Ambulatory Visit: Payer: Medicaid Other | Attending: Internal Medicine | Admitting: Internal Medicine

## 2021-01-12 ENCOUNTER — Other Ambulatory Visit: Payer: Self-pay

## 2021-01-12 VITALS — BP 198/92 | HR 71 | Resp 16 | Ht 68.0 in | Wt 154.0 lb

## 2021-01-12 DIAGNOSIS — Z1331 Encounter for screening for depression: Secondary | ICD-10-CM

## 2021-01-12 DIAGNOSIS — E785 Hyperlipidemia, unspecified: Secondary | ICD-10-CM | POA: Insufficient documentation

## 2021-01-12 DIAGNOSIS — E1121 Type 2 diabetes mellitus with diabetic nephropathy: Secondary | ICD-10-CM

## 2021-01-12 DIAGNOSIS — E113293 Type 2 diabetes mellitus with mild nonproliferative diabetic retinopathy without macular edema, bilateral: Secondary | ICD-10-CM | POA: Insufficient documentation

## 2021-01-12 DIAGNOSIS — E1159 Type 2 diabetes mellitus with other circulatory complications: Secondary | ICD-10-CM | POA: Diagnosis not present

## 2021-01-12 DIAGNOSIS — E1169 Type 2 diabetes mellitus with other specified complication: Secondary | ICD-10-CM

## 2021-01-12 DIAGNOSIS — Z8673 Personal history of transient ischemic attack (TIA), and cerebral infarction without residual deficits: Secondary | ICD-10-CM | POA: Diagnosis not present

## 2021-01-12 DIAGNOSIS — Z8249 Family history of ischemic heart disease and other diseases of the circulatory system: Secondary | ICD-10-CM | POA: Insufficient documentation

## 2021-01-12 DIAGNOSIS — I129 Hypertensive chronic kidney disease with stage 1 through stage 4 chronic kidney disease, or unspecified chronic kidney disease: Secondary | ICD-10-CM | POA: Insufficient documentation

## 2021-01-12 DIAGNOSIS — Z833 Family history of diabetes mellitus: Secondary | ICD-10-CM | POA: Insufficient documentation

## 2021-01-12 DIAGNOSIS — Z23 Encounter for immunization: Secondary | ICD-10-CM | POA: Diagnosis not present

## 2021-01-12 DIAGNOSIS — E1142 Type 2 diabetes mellitus with diabetic polyneuropathy: Secondary | ICD-10-CM | POA: Diagnosis not present

## 2021-01-12 DIAGNOSIS — Z79899 Other long term (current) drug therapy: Secondary | ICD-10-CM | POA: Insufficient documentation

## 2021-01-12 DIAGNOSIS — N1832 Chronic kidney disease, stage 3b: Secondary | ICD-10-CM

## 2021-01-12 DIAGNOSIS — Z7984 Long term (current) use of oral hypoglycemic drugs: Secondary | ICD-10-CM | POA: Diagnosis not present

## 2021-01-12 DIAGNOSIS — Z0000 Encounter for general adult medical examination without abnormal findings: Secondary | ICD-10-CM

## 2021-01-12 DIAGNOSIS — M7989 Other specified soft tissue disorders: Secondary | ICD-10-CM | POA: Insufficient documentation

## 2021-01-12 DIAGNOSIS — Z1159 Encounter for screening for other viral diseases: Secondary | ICD-10-CM

## 2021-01-12 DIAGNOSIS — E11319 Type 2 diabetes mellitus with unspecified diabetic retinopathy without macular edema: Secondary | ICD-10-CM

## 2021-01-12 DIAGNOSIS — E1122 Type 2 diabetes mellitus with diabetic chronic kidney disease: Secondary | ICD-10-CM | POA: Insufficient documentation

## 2021-01-12 DIAGNOSIS — I152 Hypertension secondary to endocrine disorders: Secondary | ICD-10-CM

## 2021-01-12 DIAGNOSIS — F32A Depression, unspecified: Secondary | ICD-10-CM | POA: Diagnosis not present

## 2021-01-12 LAB — POCT GLYCOSYLATED HEMOGLOBIN (HGB A1C): HbA1c, POC (controlled diabetic range): 5.8 % (ref 0.0–7.0)

## 2021-01-12 LAB — GLUCOSE, POCT (MANUAL RESULT ENTRY): POC Glucose: 92 mg/dl (ref 70–99)

## 2021-01-12 MED ORDER — HYDRALAZINE HCL 50 MG PO TABS: 50.0000 mg | ORAL_TABLET | Freq: Three times a day (TID) | ORAL | 6 refills | Status: DC

## 2021-01-12 MED ORDER — DAPAGLIFLOZIN PROPANEDIOL 5 MG PO TABS: 5.0000 mg | ORAL_TABLET | Freq: Every day | ORAL | 3 refills | Status: DC

## 2021-01-12 NOTE — Progress Notes (Signed)
Patient ID: Douglas Briggs, male    DOB: March 08, 1961  MRN: [id number]  CC: Annual Exam and Foot Swelling   Subjective: Douglas Briggs is a 60 y.o. 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  HTN:  pt does not have meds with him today.  He is currently on hydralazine 25 mg 3 times a day, chlorthalidone 25 mg daily, Cozaar 100 mg daily.  Clonidine 0.1 mg was recently added by his cardiologist last month. Limits salt in foods Checks BP every morning.  He does not have log with him.  This a.m before meds it was 190/93.  Last wk BP was down to 126/80 No CP/SOB. + swelling in feet and lower legs  DM:  Results for orders placed or performed in visit on 01/12/21  POCT glucose (manual entry)  Result Value Ref Range   POC Glucose 92 70 - 99 mg/dl  POCT glycosylated hemoglobin (Hb A1C)  Result Value Ref Range   Hemoglobin A1C     HbA1c POC (<> result, manual entry)     HbA1c, POC (prediabetic range)     HbA1c, POC (controlled diabetic range) 5.8 0.0 - 7.0 %  Compliant with Metformin Reports decrease appetite.  Stays away from junk foods and stopped eating pork Pt with 3.9 gram protein in the urine.  I referred him to the nephrologist.  He thinks he has an appointment coming up next month with them.   Had eye exam yesterday at [facility].  He tells me he was told that the diabetes is affecting his eyes and has been referred to [facility]  HL:  reports taking Lipitor more consistently in past mth.  LDL in June of this year was 291.  Patient admits that he was not taking the atorvastatin consistently but since then has been doing  so.  CKD 3: Still making good urine.  He had almost 4 g of protein in the urine.  He has an upcoming appointment with the nephrologist.  Creatinine has ranged from 1.75-2.10 to with most recent reading of 1.93.  GFR has ranged from 37-44 with most recent reading being 39. Patient Active Problem List   Diagnosis Date Noted   Stroke [facility])    Hypertension    Neuropathy    Protein-calorie malnutrition, mild ([place]) 09/11/2020   Hyperlipidemia associated with type 2 diabetes mellitus ([place]) 09/11/2020   Diabetes mellitus due to underlying condition with retinopathy of both eyes ([place])    Hypertension associated with diabetes ([place])    NPDR (nonproliferative diabetic retinopathy) ([place]) 02/09/2020   Influenza vaccination declined 01/20/2020   Ulcer of left lower extremity with fat layer exposed ([place]) 01/20/2020   Cellulitis 12/19/2019   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   HLD (hyperlipidemia)    Normocytic anemia    CKD (chronic kidney disease)    Essential hypertension 11/22/2019   Atherosclerotic vascular disease 11/22/2019   Acute CVA (cerebrovascular accident) ([place]) 10/30/2019   TIA (transient ischemic attack) 10/29/2019   Stroke-like symptoms 10/29/2019   Right sided numbness    Acquired hammer toe of left foot 03/12/2017   Tinea pedis of both  feet 03/12/2017   Male hypogonadism 11/14/2016   Restless leg syndrome 11/14/2016   Uncontrolled type 2 diabetes mellitus with diabetic polyneuropathy, without long-term current use of insulin ([place]) 11/12/2016     Current Outpatient Medications on File Prior to Visit  Medication Sig Dispense Refill   atorvastatin (LIPITOR) 40 MG tablet Take 80 mg by mouth daily in the afternoon.     carvedilol (COREG) 3.125 MG tablet Take 1 tablet (3.125 mg total) by mouth 2 (two) times daily with a meal. 60 tablet 6   chlorthalidone (HYGROTON) 25 MG tablet Take 1 tablet (25 mg total) by mouth daily. 90 tablet 3   ezetimibe (ZETIA) 10 MG tablet Take 10 mg by mouth  daily.     famotidine (PEPCID) 20 MG tablet Take 20 mg by mouth 2 (two) times daily as needed for heartburn or indigestion.     glimepiride (AMARYL) 1 MG tablet Take 1 tablet (1 mg total) by mouth daily with breakfast. 30 tablet 6   losartan (COZAAR) 100 MG tablet Take 1 tablet (100 mg total) by mouth daily. 30 tablet 6   Multiple Vitamin (MULTIVITAMIN WITH MINERALS) TABS tablet Take 1 tablet by mouth daily with breakfast.     ondansetron (ZOFRAN ODT) 4 MG disintegrating tablet Take 1 tablet (4 mg total) by mouth every 8 (eight) hours as needed for nausea or vomiting. 20 tablet 0   VITAMIN D PO Take 1 tablet by mouth daily with breakfast.      No current facility-administered medications on file prior to visit.    No Known Allergies  Social History   Socioeconomic History   Marital status: Single    Spouse name: Not on file   Number of children: Not on file   Years of education: Not on file   Highest education level: Not on file  Occupational History   Not on file  Tobacco Use   Smoking status: Never   Smokeless tobacco: Never  Vaping Use   Vaping Use: Never used  Substance and Sexual Activity   Alcohol use: No   Drug use: Never   Sexual activity: Not on file  Other Topics Concern   Not on file  Social History Narrative   Not on file   Social Determinants of Health   Financial Resource Strain: Not on file  Food Insecurity: Not on file  Transportation Needs: Not on file  Physical Activity: Not on file  Stress: Not on file  Social Connections: Not on file  Intimate Partner Violence: Not on file    Family History  Problem Relation Age of Onset   Hypertension Father    Cancer Father    Diabetes Paternal Aunt    Heart disease Neg Hx     Past Surgical History:  Procedure Laterality Date   AMPUTATION Left 12/20/2019   Procedure: AMPUTATION 5TH TOE;  Surgeon: Erle Crocker, MD;  Location: [place];  Service: Orthopedics;  Laterality: Left;   BUBBLE STUDY   09/22/2020   Procedure: BUBBLE STUDY;  Surgeon: Donato Heinz, MD;  Location: [place];  Service: Cardiovascular;;   LOOP RECORDER INSERTION N/A 09/22/2020   Procedure: LOOP RECORDER INSERTION;  Surgeon: Constance Haw, MD;  Location: [place] [facility];  Service: Cardiovascular;  Laterality: N/A;   TEE WITHOUT CARDIOVERSION N/A 09/22/2020   Procedure: TRANSESOPHAGEAL ECHOCARDIOGRAM (TEE);  Surgeon: Donato Heinz, MD;  Location: [facility];  Service: Cardiovascular;  Laterality: N/A;  LOOP    ROS: General: Weight  has remained stable over the past few months.  He reports decreased appetite.  He has not had any fevers or night sweats. HEENT: No decreased hearing.  No nasal congestion.  No sore throat. Chest: No chronic cough or shortness of breath. CVS: No chest pains or palpitations. Abdomen/GI: No abdominal pain.  No nausea or vomiting.  No dysphagia.  He is moving his bowels okay.  No blood in the stools. GU: He reports that he is passing his urine okay.  No blood in the urine.  PHYSICAL EXAM: BP (!) 198/92   Pulse 71   Resp 16   Ht '5\' 8"'$  (1.727 m)   Wt 154 lb (69.9 kg)   SpO2 99%   BMI 23.42 kg/m   Wt Readings from Last 3 Encounters:  01/12/21 154 lb (69.9 kg)  12/12/20 158 lb 1.9 oz (71.7 kg)  10/26/20 156 lb 1.3 oz (70.8 kg)    Physical Exam  General appearance - alert, well appearing, older African-American male and in no distress Mental status - normal mood, behavior, speech, dress, motor activity, and thought processes Eyes - pupils equal and reactive, extraocular eye movements intact Ears - bilateral TM's and external ear canals normal Nose - normal and patent, no erythema, discharge or polyps Mouth - mucous membranes moist, pharynx normal without lesions Neck - supple, no significant adenopathy Lymphatics - no palpable lymphadenopathy, no hepatosplenomegaly Chest - clear to auscultation, no wheezes, rales or rhonchi, symmetric air  entry Heart - normal rate, regular rhythm, normal S1, S2, no murmurs, rubs, clicks or gallops Abdomen - soft, nontender, nondistended, no masses or organomegaly Extremities -trace to 1+ edema of the lower one third of the legs. Diabetic Foot Exam - Simple   Simple Foot Form Visual Inspection See comments: Yes Sensation Testing Intact to touch and monofilament testing bilaterally: Yes Pulse Check Posterior Tibialis and Dorsalis pulse intact bilaterally: Yes Comments Patient is flat-footed.  The fifth toe on the LT foot has been amputated.     Depression screen [facility] 2/9 01/12/2021 10/17/2020 05/11/2020  Decreased Interest 3 0 0  Down, Depressed, Hopeless 3 0 0  PHQ - 2 Score 6 0 0  Altered sleeping 3 - -  Tired, decreased energy 3 - -  Change in appetite 3 - -  Feeling bad or failure about yourself  3 - -  Trouble concentrating 3 - -  Moving slowly or fidgety/restless 3 - -  Suicidal thoughts 2 - -  PHQ-9 Score 26 - -   GAD 7 : Generalized Anxiety Score 01/12/2021 10/17/2020 05/11/2020 02/25/2020  Nervous, Anxious, on Edge 3 0 0 0  Control/stop worrying 3 0 0 1  Worry too much - different things 3 0 0 1  Trouble relaxing 3 0 0 0  Restless 3 0 0 0  Easily annoyed or irritable 3 0 0 0  Afraid - awful might happen 3 0 0 0  Total GAD 7 Score 21 0 0 2     CMP Latest Ref Rng & Units 12/12/2020 10/31/2020 10/26/2020  Glucose 65 - 99 mg/dL 66 89 105(H)  BUN 8 - 27 mg/dL 31(H) 24 26  Creatinine 0.76 - 1.27 mg/dL 1.93(H) 2.02(H) 1.99(H)  Sodium 134 - 144 mmol/L 140 142 139  Potassium 3.5 - 5.2 mmol/L 5.1 4.9 5.5(H)  Chloride 96 - 106 mmol/L 110(H) 112(H) 108(H)  CO2 20 - 29 mmol/L 18(L) 21 21  Calcium 8.6 - 10.2 mg/dL 8.8 8.6 8.6  Total Protein 6.0 -  8.5 g/dL - - 5.5(L)  Total Bilirubin 0.0 - 1.2 mg/dL - - <0.2  Alkaline Phos 44 - 121 IU/L - - 120  AST 0 - 40 IU/L - - 12  ALT 0 - 44 IU/L - - 16   Lipid Panel     Component Value Date/Time   CHOL 404 (H) 10/26/2020 1058   TRIG 190  (H) 10/26/2020 1058   HDL 74 10/26/2020 1058   CHOLHDL 5.5 (H) 10/26/2020 1058   CHOLHDL 4.4 12/21/2019 0123   VLDL 23 12/21/2019 0123   LDLCALC 291 (H) 10/26/2020 1058    CBC    Component Value Date/Time   WBC 4.2 09/06/2020 1327   WBC 5.6 02/18/2020 1355   RBC 4.08 (L) 09/06/2020 1327   RBC 3.99 (L) 02/18/2020 1355   HGB 11.1 (L) 09/06/2020 1327   HCT 34.6 (L) 09/06/2020 1327   PLT 164 09/06/2020 1327   MCV 85 09/06/2020 1327   MCH 27.2 09/06/2020 1327   MCH 27.3 02/18/2020 1355   MCHC 32.1 09/06/2020 1327   MCHC 33.2 02/18/2020 1355   RDW 14.2 09/06/2020 1327   LYMPHSABS 1.3 12/22/2019 0424   MONOABS 0.5 12/22/2019 0424   EOSABS 0.2 12/22/2019 0424   BASOSABS 0.0 12/22/2019 0424    ASSESSMENT AND PLAN:  1. Annual physical exam   2. Type 2 diabetes mellitus with retinopathy of both eyes, without long-term current use of insulin, macular edema presence unspecified, unspecified retinopathy severity ([place]) Diabetes under good control. I recommend stopping metformin given his GFR at this time.  We will put him on Farxiga instead which will help with his CKD.  Advised patient to stay hydrated while on this medication. Continue healthy eating habits. - POCT glucose (manual entry) - POCT glycosylated hemoglobin (Hb A1C) - dapagliflozin propanediol (FARXIGA) 5 MG TABS tablet; Take 1 tablet (5 mg total) by mouth daily before breakfast. Stop Metformin  Dispense: 30 tablet; Refill: 3  3. Hypertension associated with diabetes ([place]) Not at goal.  Stop clonidine. Increase hydralazine to 50 mg 3 times a day. Follow-up with clinical pharmacist in 2 weeks for repeat blood pressure check.  Advised to bring his medications with him to that visit. - hydrALAZINE (APRESOLINE) 50 MG tablet; Take 1 tablet (50 mg total) by mouth 3 (three) times daily. STOP Clonidine  Dispense: 90 tablet; Refill: 6  4. Macroalbuminuric diabetic nephropathy ([place]) Referred to nephrology.  Patient reports he  has an upcoming appointment.  He is on maximum dose of Cozaar.  5. Hyperlipidemia associated with type 2 diabetes mellitus ([place]) Encouraged him to be compliant with taking the atorvastatin.  We will plan to recheck lipid profile on next visit  6. Stage 3b chronic kidney disease (HCC) - dapagliflozin propanediol (FARXIGA) 5 MG TABS tablet; Take 1 tablet (5 mg total) by mouth daily before breakfast. Stop Metformin  Dispense: 30 tablet; Refill: 3  7. Positive depression screening We did not get to address this today.  However message sent to our LCSW to touch base with him in regards to this.  8. Need for vaccination against Streptococcus pneumoniae - Pneumococcal conjugate vaccine 20-valent  9. Need for hepatitis C screening test - Hepatitis C Antibody  Encouraged to get his booster shot of the COVID-19 vaccine.  Patient was given the opportunity to ask questions.  Patient verbalized understanding of the plan and was able to repeat key elements of the plan.   Orders Placed This Encounter  Procedures   Pneumococcal conjugate vaccine  20-valent   Hepatitis C Antibody   POCT glucose (manual entry)   POCT glycosylated hemoglobin (Hb A1C)     Requested Prescriptions   Signed Prescriptions Disp Refills   hydrALAZINE (APRESOLINE) 50 MG tablet 90 tablet 6    Sig: Take 1 tablet (50 mg total) by mouth 3 (three) times daily. STOP Clonidine   dapagliflozin propanediol (FARXIGA) 5 MG TABS tablet 30 tablet 3    Sig: Take 1 tablet (5 mg total) by mouth daily before breakfast. Stop Metformin    Return in about 3 months (around 04/14/2021) for Give appt with [facility] in 2 wks for BP recheck.  Karle Plumber, MD, FACP

## 2021-01-12 NOTE — Patient Instructions (Signed)
Stop metformin. Start [place] instead.  Your blood pressure is not at goal. Increase hydralazine to 50 mg 3 times a day. Stop clonidine.

## 2021-01-13 LAB — HEPATITIS C ANTIBODY: Hep C Virus Ab: 0.1 s/co ratio (ref 0.0–0.9)

## 2021-01-15 ENCOUNTER — Ambulatory Visit (INDEPENDENT_AMBULATORY_CARE_PROVIDER_SITE_OTHER): Payer: Medicaid Other

## 2021-01-15 DIAGNOSIS — I639 Cerebral infarction, unspecified: Secondary | ICD-10-CM | POA: Diagnosis not present

## 2021-01-16 ENCOUNTER — Telehealth: Payer: Self-pay | Admitting: Clinical

## 2021-01-16 ENCOUNTER — Encounter: Payer: Self-pay | Admitting: Cardiology

## 2021-01-16 ENCOUNTER — Other Ambulatory Visit: Payer: Self-pay

## 2021-01-16 ENCOUNTER — Ambulatory Visit: Payer: Medicaid Other | Admitting: Cardiology

## 2021-01-16 VITALS — BP 132/64 | HR 100 | Ht 68.0 in | Wt 152.1 lb

## 2021-01-16 DIAGNOSIS — I7090 Unspecified atherosclerosis: Secondary | ICD-10-CM | POA: Diagnosis not present

## 2021-01-16 DIAGNOSIS — I10 Essential (primary) hypertension: Secondary | ICD-10-CM | POA: Diagnosis not present

## 2021-01-16 DIAGNOSIS — E782 Mixed hyperlipidemia: Secondary | ICD-10-CM

## 2021-01-16 DIAGNOSIS — E1142 Type 2 diabetes mellitus with diabetic polyneuropathy: Secondary | ICD-10-CM

## 2021-01-16 DIAGNOSIS — E1165 Type 2 diabetes mellitus with hyperglycemia: Secondary | ICD-10-CM

## 2021-01-16 DIAGNOSIS — IMO0002 Reserved for concepts with insufficient information to code with codable children: Secondary | ICD-10-CM

## 2021-01-16 LAB — CUP PACEART REMOTE DEVICE CHECK
Date Time Interrogation Session: 20220819200955
Implantable Pulse Generator Implant Date: 20220429

## 2021-01-16 NOTE — Progress Notes (Signed)
Cardiology Office Note:    Date:  01/16/2021   ID:  Douglas Briggs, DOB 10-06-1960, MRN [id number]  PCP:  Ladell Pier, MD  Cardiologist:  Jenean Lindau, MD   Referring MD: Ladell Pier, MD    ASSESSMENT:    1. Atherosclerotic vascular disease   2. Essential hypertension   3. Mixed hyperlipidemia   4. Uncontrolled type 2 diabetes mellitus with diabetic polyneuropathy, without long-term current use of insulin (HCC)    PLAN:    In order of problems listed above:  Atherosclerotic vascular disease and cerebrovascular disease: Secondary prevention stressed with the patient.  Importance of compliance with diet medication stressed and he vocalized understanding.  He was advised to walk at least half an hour a day 5 days a week and he is promising to do it regularly. Essential hypertension: Lifestyle modification urged.  Blood pressure stable and he is happy about it. Mixed dyslipidemia: Lipids reviewed he is taking his medications regularly.  Back in the morning fasting for blood work including liver and lipid. Diabetes mellitus with endorgan damage: Managed by primary care.  Diet was emphasized.  He is being good attention to his medical providers and following their advice meticulously.  His hemoglobin A1c is mildly elevated but he is doing his best to get control. Patient will be seen in follow-up appointment in 6 months or earlier if the patient has any concerns    Medication Adjustments/Labs and Tests Ordered: Current medicines are reviewed at length with the patient today.  Concerns regarding medicines are outlined above.  No orders of the defined types were placed in this encounter.  No orders of the defined types were placed in this encounter.    No chief complaint on file.    History of Present Illness:    Douglas Briggs is a 60 y.o. male.  Patient has past medical history of essential hypertension, mixed dyslipidemia, diabetes mellitus and renal insufficiency.   He denies any problems at this time and takes care of activities of daily living.  No chest pain orthopnea or PND.  He is walking on a regular basis and taking his medications regularly.  He is happy about it.  At the time of my evaluation, the patient is alert awake oriented and in no distress.  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 Dr. Tacy Dura - seen 11/24/19   Protein-calorie malnutrition, mild ([place]) 09/11/2020   Restless leg syndrome 11/14/2016   Right sided numbness    Stroke ([place])    Stroke-like symptoms 10/29/2019   TIA (transient ischemic attack) 10/29/2019   Tinea pedis of both feet 03/12/2017   Ulcer of left lower extremity with fat layer exposed ([place]) 01/20/2020   Uncontrolled type 2 diabetes mellitus with diabetic polyneuropathy, without long-term current  use of insulin ([place]) 11/12/2016    Past Surgical History:  Procedure Laterality Date   AMPUTATION Left 12/20/2019   Procedure: AMPUTATION 5TH TOE;  Surgeon: Erle Crocker, MD;  Location: [facility];  Service: Orthopedics;  Laterality: Left;   BUBBLE STUDY  09/22/2020   Procedure: BUBBLE STUDY;  Surgeon: Donato Heinz, MD;  Location: [facility];  Service: Cardiovascular;;   LOOP RECORDER INSERTION  N/A 09/22/2020   Procedure: LOOP RECORDER INSERTION;  Surgeon: Constance Haw, MD;  Location: [facility];  Service: Cardiovascular;  Laterality: N/A;   TEE WITHOUT CARDIOVERSION N/A 09/22/2020   Procedure: TRANSESOPHAGEAL ECHOCARDIOGRAM (TEE);  Surgeon: Donato Heinz, MD;  Location: [facility] ENDOSCOPY;  Service: Cardiovascular;  Laterality: N/A;  LOOP    Current Medications: Current Meds  Medication Sig   atorvastatin (LIPITOR) 40 MG tablet Take 80 mg by mouth daily in the afternoon.   carvedilol (COREG) 3.125 MG tablet Take 1 tablet (3.125 mg total) by mouth 2 (two) times daily with a meal.   chlorthalidone (HYGROTON) 25 MG tablet Take 1 tablet (25 mg total) by mouth daily.   dapagliflozin propanediol (FARXIGA) 5 MG TABS tablet Take 1 tablet (5 mg total) by mouth daily before breakfast. Stop Metformin   ezetimibe (ZETIA) 10 MG tablet Take 10 mg by mouth daily.   famotidine (PEPCID) 20 MG tablet Take 20 mg by mouth 2 (two) times daily as needed for heartburn or indigestion.   glimepiride (AMARYL) 1 MG tablet Take 1 tablet (1 mg total) by mouth daily with breakfast.   hydrALAZINE (APRESOLINE) 50 MG tablet Take 1 tablet (50 mg total) by mouth 3 (three) times daily. STOP Clonidine   losartan (COZAAR) 100 MG tablet Take 1 tablet (100 mg total) by mouth daily.   Multiple Vitamin (MULTIVITAMIN WITH MINERALS) TABS tablet Take 1 tablet by mouth daily with breakfast.   ondansetron (ZOFRAN ODT) 4 MG disintegrating tablet Take 1 tablet (4 mg total) by mouth every 8 (eight) hours as needed for nausea or vomiting.   VITAMIN D PO Take 1 tablet by mouth daily with breakfast.      Allergies:   Patient has no known allergies.   Social History   Socioeconomic History   Marital status: Single    Spouse name: Not on file   Number of children: Not on file   Years of education: Not on file   Highest education level: Not on file  Occupational History   Not on file  Tobacco Use   Smoking  status: Never   Smokeless tobacco: Never  Vaping Use   Vaping Use: Never used  Substance and Sexual Activity   Alcohol use: No   Drug use: Never   Sexual activity: Not on file  Other Topics Concern   Not on file  Social History Narrative   Not on file   Social Determinants of Health   Financial Resource Strain: Not on file  Food Insecurity: Not on file  Transportation Needs: Not on file  Physical Activity: Not on file  Stress: Not on file  Social Connections: Not on file     Family History: The patient's family history includes Cancer in his father; Diabetes in his paternal aunt; Hypertension in his father. There is no history of Heart disease.  ROS:   Please see the history of present illness.    All other systems reviewed and are negative.  EKGs/Labs/Other Studies Reviewed:    The following studies were reviewed today: I  discussed my findings with the patient at length   Recent Labs: 09/06/2020: Hemoglobin 11.1; Platelets 164 09/11/2020: TSH 2.300 10/17/2020: BNP 25.0 10/26/2020: ALT 16 12/12/2020: BUN 31; Creatinine, Ser 1.93; Potassium 5.1; Sodium 140  Recent Lipid Panel    Component Value Date/Time   CHOL 404 (H) 10/26/2020 1058   TRIG 190 (H) 10/26/2020 1058   HDL 74 10/26/2020 1058   CHOLHDL 5.5 (H) 10/26/2020 1058   CHOLHDL 4.4 12/21/2019 0123   VLDL 23 12/21/2019 0123   LDLCALC 291 (H) 10/26/2020 1058    Physical Exam:    VS:  BP 132/64   Pulse 100   Ht '5\' 8"'$  (1.727 m)   Wt 152 lb 1.9 oz (69 kg)   BMI 23.13 kg/m     Wt Readings from Last 3 Encounters:  01/16/21 152 lb 1.9 oz (69 kg)  01/12/21 154 lb (69.9 kg)  12/12/20 158 lb 1.9 oz (71.7 kg)     GEN: Patient is in no acute distress HEENT: Normal NECK: No JVD; No carotid bruits LYMPHATICS: No lymphadenopathy CARDIAC: Hear sounds regular, 2/6 systolic murmur at the apex. RESPIRATORY:  Clear to auscultation without rales, wheezing or rhonchi  ABDOMEN: Soft, non-tender,  non-distended MUSCULOSKELETAL:  No edema; No deformity  SKIN: Warm and dry NEUROLOGIC:  Alert and oriented x 3 PSYCHIATRIC:  Normal affect   Signed, Jenean Lindau, MD  01/16/2021 11:26 AM    [place]

## 2021-01-16 NOTE — Patient Instructions (Signed)

## 2021-01-23 NOTE — Telephone Encounter (Signed)
Second attempt to reach pt, no answer, left vm.

## 2021-01-24 NOTE — Telephone Encounter (Signed)
Patient called the office returning call from 01/23/2021

## 2021-01-30 NOTE — Telephone Encounter (Signed)
Attempted to call again, no answer, left vm, will try once more prior to sending letter

## 2021-01-31 NOTE — Progress Notes (Signed)
Carelink Summary Report / Loop Recorder 

## 2021-02-02 NOTE — Telephone Encounter (Signed)
Spoke with pt and scheduled appt for 02/26/21 at 9:00

## 2021-02-05 ENCOUNTER — Ambulatory Visit: Payer: Medicaid Other | Admitting: Pharmacist

## 2021-02-19 ENCOUNTER — Ambulatory Visit (INDEPENDENT_AMBULATORY_CARE_PROVIDER_SITE_OTHER): Payer: Medicare Other

## 2021-02-19 DIAGNOSIS — I639 Cerebral infarction, unspecified: Secondary | ICD-10-CM

## 2021-02-20 LAB — CUP PACEART REMOTE DEVICE CHECK
Date Time Interrogation Session: 20220921201201
Implantable Pulse Generator Implant Date: 20220429

## 2021-02-23 NOTE — Progress Notes (Signed)
Carelink Summary Report / Loop Recorder 

## 2021-02-24 HISTORY — PX: CATARACT EXTRACTION: SUR2

## 2021-02-26 ENCOUNTER — Ambulatory Visit: Payer: Medicare Other | Attending: Internal Medicine | Admitting: Clinical

## 2021-02-26 ENCOUNTER — Other Ambulatory Visit: Payer: Self-pay

## 2021-02-26 DIAGNOSIS — F4323 Adjustment disorder with mixed anxiety and depressed mood: Secondary | ICD-10-CM

## 2021-03-01 NOTE — BH Specialist Note (Signed)
Integrated Behavioral Health Initial In-Person Visit  MRN: [id number] Name: Douglas Briggs  Number of [place] Clinician visits:: 1/6 Session Start time: 9:20am  Session End time: 10:00am Total time: 40  minutes  Types of Service: Individual psychotherapy  Interpretor:No. Interpretor Name and Language: N/A   Warm Hand Off Completed.        Subjective: Douglas Briggs is a 60 y.o. male accompanied by  self Patient was referred by PCP Karle Plumber for depression and anxiety. Patient reports the following symptoms/concerns: Reports feeling depressed, trouble relaxing, decreased energy, self-esteem disturbances, decreased appetite, anxiousness, excessive worrying, and irritability. Reports that he has experienced physical health changes that have caused him to no longer work. 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  Duration of problem: 6 months; Severity of problem: moderate  Objective: Mood: Anxious and Depressed and Affect: Appropriate Risk of harm to self or others: No plan to harm self or others  Life Context: Family and Social: Reports receiving support from his family. School/Work: Pt is unemployed due to physical health issues. Self-Care: Reports limited coping skills. Denies substance use.  Life Changes: Reports that he has experienced physical health changes which has caused him to no longer be able to work on cars.  Patient and/or Family's Strengths/Protective Factors: Concrete supports in place (healthy food, safe environments, etc.)  Goals Addressed: Patient will: Reduce symptoms of: anxiety and depression Increase knowledge and/or ability of: coping skills  Demonstrate ability to: Increase healthy adjustment to current life circumstances  Progress towards Goals: Ongoing  Interventions: Interventions utilized: Mindfulness or Psychologist, educational, CBT Cognitive Behavioral  Therapy, Supportive Counseling, and Psychoeducation and/or Health Education  Standardized Assessments completed: C-SSRS Short, GAD-7, and PHQ 9 [place] from 02/26/2021 in [facility]  PHQ-9 Total Score 20       GAD 7 : Generalized Anxiety Score 03/01/2021 01/12/2021 10/17/2020 05/11/2020  Nervous, Anxious, on Edge 2 3 0 0  Control/stop worrying 2 3 0 0  Worry too much - different things 2 3 0 0  Trouble relaxing 2 3 0 0  Restless 2 3 0 0  Easily annoyed or irritable 2 3 0 0  Afraid - awful might happen 2 3 0 0  Total GAD 7 Score 14 21 0 0   Patient and/or Family Response: Pt receptive to tx. Pt receptive to psychoeducation provided on anxiety and depression. Pt receptive to thought reframing in order to decrease pt worries. Pt will utilize deep breathing exercises to assist with relaxation and continue identifying enjoyable activities in order to improve healthy coping skill.  Patient Centered Plan: Patient is on the following Treatment Plan(s):  Depression and Anxiety  Assessment: Denies SI/HI. Denies auditory/visual hallucinations. Pt provided with crisis resources. Patient currently experiencing an adjustment reaction with anxiety and depression. Pt has had difficulty adjusting to being unable to work on cars. Pt has been working on cars since adolescence. Pt appears to worry excessively and has difficulty with not being busy.    Patient may benefit from brief interventions to assist with adjustment. Pt would benefit from identifying healthy coping skills in order to decrease worries. LCSWA provided psychoeducation on depression and anxiety and attempted to normalize pt's difficulty adjusting to not working. LCSWA utilized thought reframing to decrease pt worries. LCSWA encouraged pt to utilize deep breathing exercises. LCSWA will fu with pt.  Plan: Follow up with  behavioral health clinician on : 03/19/21 Behavioral  recommendations: Utilize deep breathing exercises and continue identifying enjoyable activities Referral(s): [facility] (In Clinic) "From scale of 1-10, how likely are you to follow plan?": 10  Leeyah Heather C Kewan Mcnease, LCSW

## 2021-03-02 ENCOUNTER — Encounter (HOSPITAL_COMMUNITY): Payer: Medicare Other

## 2021-03-02 ENCOUNTER — Ambulatory Visit: Payer: Medicare Other

## 2021-03-05 ENCOUNTER — Ambulatory Visit: Payer: Medicare Other | Admitting: Pharmacist

## 2021-03-19 ENCOUNTER — Ambulatory Visit: Payer: Medicare Other | Admitting: Clinical

## 2021-03-20 LAB — CUP PACEART REMOTE DEVICE CHECK
Date Time Interrogation Session: 20221024201006
Implantable Pulse Generator Implant Date: 20220429

## 2021-03-21 NOTE — Progress Notes (Signed)
HISTORY AND PHYSICAL     CC:  follow up. Requesting Provider:  Ladell Pier, MD  HPI: This is a 60 y.o. male who is here today for follow up for PAD.  He was seen as an hospital consult June 2021.  At that time he had an acute stroke.  As part of his work-up he was noted to have an incidental finding of a left superficial femoral artery occlusion on a DVT ultrasound.  Patient has had several years of tightness in his left calf with walking.  But he has noticed since he got out of the hospital he has really not had any left leg symptoms.  But he has noticed since he got out of the hospital he has really not had any left leg symptoms.  He was riding a bicycle and had no problems with his legs.  He also has a history of peripheral neuropathy.  Most of his sx felt like a tightness around the foot down both legs which has been chronic for quite some time.  Further evaluation of his legs was deferred previously due to his stroke.  His ABI in June 2021 was 0.87 on the right 0.66 on the left.  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 Lucia Gaskins from orthopedics in July 2021.  This was done for an infection in his left fifth toe.   Pt was last seen September 2021 by Dr. Oneida Alar and at that time, he had healed a left toe amputation.  He was not having any claudication sx.  He was having the felling of tightness and dysesthesias in both feet that was consistent with neuropathy.   He was scheduled for f/u in one year.   The pt returns today for follow up.  He states he continues to have some tightness in both feet.  He denies any claudication, non healing wounds or rest pain.  He is compliant with his asa/statin.  He denies any visual changes, speech difficulties or unilateral sudden numbness, weakness or paralysis.   The pt is on a statin for cholesterol management.    The pt is not on an aspirin.    Other AC:  none The pt is on BB, ARB for hypertension.  The pt does have diabetes. Tobacco hx:   never  Pt does not have family hx of AAA.  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 Dr. Tacy Dura - seen 11/24/19   Protein-calorie malnutrition, mild ([place]) 09/11/2020   Restless leg syndrome 11/14/2016   Right sided numbness    Stroke ([place])    Stroke-like symptoms 10/29/2019   TIA (transient ischemic attack) 10/29/2019   Tinea pedis of both feet 03/12/2017   Ulcer of left lower extremity with fat layer exposed ([place]) 01/20/2020  Uncontrolled type 2 diabetes mellitus with diabetic polyneuropathy, without long-term current use of insulin ([place]) 11/12/2016    Past Surgical History:  Procedure Laterality Date   AMPUTATION Left 12/20/2019   Procedure: AMPUTATION 5TH TOE;  Surgeon: Erle Crocker, MD;  Location: [facility];  Service: Orthopedics;  Laterality: Left;   BUBBLE STUDY  09/22/2020   Procedure: BUBBLE STUDY;  Surgeon: Donato Heinz, MD;  Location: [facility];  Service: Cardiovascular;;   LOOP RECORDER INSERTION N/A 09/22/2020   Procedure: LOOP RECORDER INSERTION;  Surgeon: Constance Haw, MD;  Location: [facility];  Service: Cardiovascular;  Laterality: N/A;   TEE WITHOUT CARDIOVERSION N/A 09/22/2020   Procedure: TRANSESOPHAGEAL ECHOCARDIOGRAM  (TEE);  Surgeon: Donato Heinz, MD;  Location: [facility] ENDOSCOPY;  Service: Cardiovascular;  Laterality: N/A;  LOOP    No Known Allergies  Current Outpatient Medications  Medication Sig Dispense Refill   atorvastatin (LIPITOR) 40 MG tablet Take 80 mg by mouth daily in the afternoon.     carvedilol (COREG) 3.125 MG tablet Take 1 tablet (3.125 mg total) by mouth 2 (two) times daily with a meal. 60 tablet 6   chlorthalidone (HYGROTON) 25 MG tablet Take 1 tablet (25 mg total) by mouth daily. 90 tablet 3   dapagliflozin propanediol (FARXIGA) 5 MG TABS tablet Take 1 tablet (5 mg total) by mouth daily before breakfast. Stop Metformin 30 tablet 3   ezetimibe (ZETIA) 10 MG tablet Take 10 mg by mouth daily.     famotidine (PEPCID) 20 MG tablet Take 20 mg by mouth 2 (two) times daily as needed for heartburn or indigestion.     glimepiride (AMARYL) 1 MG tablet Take 1 tablet (1 mg total) by mouth daily with breakfast. 30 tablet 6   hydrALAZINE (APRESOLINE) 50 MG tablet Take 1 tablet (50 mg total) by mouth 3 (three) times daily. STOP Clonidine 90 tablet 6   losartan (COZAAR) 100 MG tablet Take 1 tablet (100 mg total) by mouth daily. 30 tablet 6   Multiple Vitamin (MULTIVITAMIN WITH MINERALS) TABS tablet Take 1 tablet by mouth daily with breakfast.     ondansetron (ZOFRAN ODT) 4 MG disintegrating tablet Take 1 tablet (4 mg total) by mouth every 8 (eight) hours as needed for nausea or vomiting. 20 tablet 0   VITAMIN D PO Take 1 tablet by mouth daily with breakfast.      No current facility-administered medications for this visit.    Family History  Problem Relation Age of Onset   Hypertension Father    Cancer Father    Diabetes Paternal Aunt    Heart disease Neg Hx     Social History   Socioeconomic History   Marital status: Single    Spouse name: Not on file   Number of children: Not on file   Years of education: Not on file   Highest education level: Not on file  Occupational History    Not on file  Tobacco Use   Smoking status: Never   Smokeless tobacco: Never  Vaping Use   Vaping Use: Never used  Substance and Sexual Activity   Alcohol use: No   Drug use: Never   Sexual activity: Not on file  Other Topics Concern   Not on file  Social History Narrative   Not on file   Social Determinants of Health   Financial Resource Strain: Not on file  Food Insecurity: Not on file  Transportation Needs: Not on file  Physical Activity: Not  on file  Stress: Not on file  Social Connections: Not on file  Intimate Partner Violence: Not on file     REVIEW OF SYSTEMS:   [X]  denotes positive finding, [ ]  denotes negative finding Cardiac  Comments:  Chest pain or chest pressure:    Shortness of breath upon exertion:    Short of breath when lying flat:    Irregular heart rhythm:        Vascular    Pain in calf, thigh, or hip brought on by ambulation:    Pain in feet at night that wakes you up from your sleep:     Blood clot in your veins:    Leg swelling:         Pulmonary    Oxygen at home:    Productive cough:     Wheezing:         Neurologic    Sudden weakness in arms or legs:     Sudden numbness in arms or legs:     Sudden onset of difficulty speaking or slurred speech:    Temporary loss of vision in one eye:     Problems with dizziness:         Gastrointestinal    Blood in stool:     Vomited blood:         Genitourinary    Burning when urinating:     Blood in urine:        Psychiatric    Major depression:         Hematologic    Bleeding problems:    Problems with blood clotting too easily:        Skin    Rashes or ulcers:        Constitutional    Fever or chills:      PHYSICAL EXAMINATION:  Today's Vitals   03/23/21 1244  BP: (!) 157/83  Pulse: 100  Resp: 16  Temp: 97.9 F (36.6 C)  TempSrc: Temporal  SpO2: 100%  Weight: 157 lb (71.2 kg)  Height: 5\' 8"  (1.727 m)   Body mass index is 23.87 kg/m.   General:  WDWN in NAD;  vital signs documented above Gait: Not observed HENT: WNL, normocephalic Pulmonary: normal non-labored breathing , without wheezing Cardiac: regular HR, without Murmur; with carotid bruit on the right Abdomen: soft, NT, no masses; aortic pulse is not palpable Skin: without rashes Vascular Exam/Pulses:  Right Left  Radial 2+ (normal) 2+ (normal)  Popliteal Unable to palpate Unable to palpate  DP 1+ (weak) with brisk doppler flow 1+ (weak) brisk doppler flow  PT Brisk doppler flow Brisk doppler flow   Extremities: without ischemic changes, without Gangrene , without cellulitis; without open wounds;  Musculoskeletal: no muscle wasting or atrophy  Neurologic: A&O X 3 Psychiatric:  The pt has Normal affect.   Non-Invasive Vascular Imaging:   ABI's/TBI's on 03/23/2021: Right:  0.98/0.33 - Great toe pressure: 62 Left:  0.63/0.41 - Great toe pressure: 77   Previous ABI's/TBI's on 10/31/2019: Right:  0.87/0.43 - Great toe pressure: 78 Left:  0.66/0.48 - Great toe pressure:  86   ASSESSMENT/PLAN:: 60 y.o. male here for follow up for PAD  PAD -pt ABI essentially unchanged from last visit. He does not have any claudication or rest pain or non healing wounds.  He will continue his asa/statin and will elevate his legs as tolerated to see if he can get any relief  -pt will f/u in one year with ABI.  He knows to call sooner if he develops any rest pain or non healing wounds  Right carotid bruit -pt asymptomatic but does have a right carotid bruit.  Will have him return in the next 2-4 weeks to check a carotid duplex.  Discussed s/s of stroke with pt and he will call 911 if he has any issues.    Leontine Locket, [facility] [phone]  Clinic MD:   Virl Cagey

## 2021-03-23 ENCOUNTER — Ambulatory Visit (INDEPENDENT_AMBULATORY_CARE_PROVIDER_SITE_OTHER): Payer: Medicare Other | Admitting: Physician Assistant

## 2021-03-23 ENCOUNTER — Ambulatory Visit (HOSPITAL_COMMUNITY)
Admission: RE | Admit: 2021-03-23 | Discharge: 2021-03-23 | Disposition: A | Discharge status: Home / Self Care | Payer: Medicare Other | Source: Ambulatory Visit | Attending: Vascular Surgery | Admitting: Vascular Surgery

## 2021-03-23 ENCOUNTER — Other Ambulatory Visit: Payer: Self-pay

## 2021-03-23 ENCOUNTER — Encounter: Payer: Self-pay | Admitting: Physician Assistant

## 2021-03-23 VITALS — BP 157/83 | HR 100 | Temp 97.9°F | Resp 16 | Ht 68.0 in | Wt 157.0 lb

## 2021-03-23 DIAGNOSIS — I639 Cerebral infarction, unspecified: Secondary | ICD-10-CM

## 2021-03-23 DIAGNOSIS — I739 Peripheral vascular disease, unspecified: Secondary | ICD-10-CM

## 2021-03-26 ENCOUNTER — Ambulatory Visit (INDEPENDENT_AMBULATORY_CARE_PROVIDER_SITE_OTHER): Payer: Medicare Other

## 2021-03-26 ENCOUNTER — Ambulatory Visit: Payer: Medicare Other | Admitting: Clinical

## 2021-03-26 DIAGNOSIS — I639 Cerebral infarction, unspecified: Secondary | ICD-10-CM | POA: Diagnosis not present

## 2021-03-28 ENCOUNTER — Other Ambulatory Visit: Payer: Self-pay

## 2021-03-28 DIAGNOSIS — I739 Peripheral vascular disease, unspecified: Secondary | ICD-10-CM

## 2021-03-28 DIAGNOSIS — I639 Cerebral infarction, unspecified: Secondary | ICD-10-CM

## 2021-03-30 ENCOUNTER — Telehealth (INDEPENDENT_AMBULATORY_CARE_PROVIDER_SITE_OTHER): Payer: Self-pay

## 2021-03-30 NOTE — Telephone Encounter (Signed)
Copied from [place] [id number]. Topic: Appointment Scheduling - Scheduling Inquiry for Clinic >> Mar 26, 2021  2:27 PM [place], Brayton Layman A wrote: Reason for CRM: Pt needs to reschedule the following appt, stated he could not make it due to weather:  Visit Type: INTEGRATED [place] Provider: Ruffin Pyo, [place] [phone]

## 2021-04-02 NOTE — Progress Notes (Signed)
Carelink Summary Report / Loop Recorder 

## 2021-04-04 ENCOUNTER — Ambulatory Visit (HOSPITAL_COMMUNITY)
Admission: RE | Admit: 2021-04-04 | Discharge: 2021-04-04 | Disposition: A | Discharge status: Home / Self Care | Payer: Medicare Other | Source: Ambulatory Visit | Attending: Vascular Surgery | Admitting: Vascular Surgery

## 2021-04-04 ENCOUNTER — Other Ambulatory Visit: Payer: Self-pay

## 2021-04-04 ENCOUNTER — Ambulatory Visit (INDEPENDENT_AMBULATORY_CARE_PROVIDER_SITE_OTHER): Payer: Medicare Other | Admitting: Physician Assistant

## 2021-04-04 VITALS — BP 203/90 | HR 55 | Temp 97.9°F | Resp 20 | Ht 68.0 in | Wt 157.0 lb

## 2021-04-04 DIAGNOSIS — I639 Cerebral infarction, unspecified: Secondary | ICD-10-CM

## 2021-04-04 DIAGNOSIS — I739 Peripheral vascular disease, unspecified: Secondary | ICD-10-CM

## 2021-04-04 DIAGNOSIS — R0989 Other specified symptoms and signs involving the circulatory and respiratory systems: Secondary | ICD-10-CM

## 2021-04-04 NOTE — Progress Notes (Signed)
History of Present Illness:  Patient is a 60 y.o. year old male who presents for evaluation of carotid stenosis after heard on exam on 03/23/21.  The patient denies symptoms of TIA, amaurosis, or stroke.  He has history of TIA in 2021.  His ABI in June 2021 was 0.87 on the right 0.66 on the left.  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 Lucia Gaskins from orthopedics in July 2021.  This was done for an infection in his left fifth toe.  He is followed for Left LE PAD as well.    The pt is on a statin for cholesterol management.    The pt is not on an aspirin.    Other AC:  none The pt is on BB, ARB for hypertension.  The pt does have diabetes. Tobacco hx:  never   Pt does not have family hx of AAA.   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 Dr. Tacy Dura - seen 11/24/19   Protein-calorie malnutrition, mild ([place]) 09/11/2020   Restless leg syndrome 11/14/2016   Right sided numbness    Stroke ([place])    Stroke-like symptoms 10/29/2019   TIA (transient ischemic attack) 10/29/2019   Tinea pedis of both feet 03/12/2017   Ulcer of left lower extremity with fat layer exposed ([place]) 01/20/2020   Uncontrolled type 2 diabetes mellitus with diabetic polyneuropathy, without  long-term current use of insulin 11/12/2016    Past Surgical History:  Procedure Laterality Date   AMPUTATION Left 12/20/2019   Procedure: AMPUTATION 5TH TOE;  Surgeon: Erle Crocker, MD;  Location: [facility];  Service: Orthopedics;  Laterality: Left;   BUBBLE STUDY  09/22/2020   Procedure: BUBBLE STUDY;  Surgeon: Donato Heinz, MD;  Location: [facility];  Service: Cardiovascular;;   LOOP RECORDER INSERTION N/A 09/22/2020   Procedure: LOOP RECORDER INSERTION;  Surgeon: Constance Haw, MD;  Location: [facility];  Service: Cardiovascular;  Laterality: N/A;   TEE WITHOUT CARDIOVERSION N/A 09/22/2020   Procedure: TRANSESOPHAGEAL ECHOCARDIOGRAM (TEE);  Surgeon: Donato Heinz, MD;  [facility] END[facility]COPY;  Service: Cardiovascular;  Laterality: N/A;  LOOP     Social History Social History   Tobacco Use   Smoking status: Never   Smokeless tobacco: Never  Vaping Use   Vaping Use: Never used  Substance Use Topics   Alcohol use: No   Drug use: Never    Family History Family History  Problem Relation Age of Onset   Hypertension Father    Cancer Father    Diabetes Paternal Aunt    Heart disease Neg Hx     Allergies  No Known Allergies   Current Outpatient Medications  Medication Sig Dispense Refill   aspirin EC 81  MG tablet Take 81 mg by mouth daily. Swallow whole.     atorvastatin (LIPITOR) 40 MG tablet Take 80 mg by mouth daily in the afternoon.     carvedilol (COREG) 3.125 MG tablet Take 1 tablet (3.125 mg total) by mouth 2 (two) times daily with a meal. 60 tablet 6   dapagliflozin propanediol (FARXIGA) 5 MG TABS tablet Take 1 tablet (5 mg total) by mouth daily before breakfast. Stop Metformin 30 tablet 3   ezetimibe (ZETIA) 10 MG tablet Take 10 mg by mouth daily.     famotidine (PEPCID) 20 MG tablet Take 20 mg by mouth 2 (two) times daily as needed for heartburn or indigestion.     glimepiride (AMARYL) 1 MG tablet Take 1 tablet (1 mg total)  by mouth daily with breakfast. 30 tablet 6   hydrALAZINE (APRESOLINE) 50 MG tablet Take 1 tablet (50 mg total) by mouth 3 (three) times daily. STOP Clonidine 90 tablet 6   ketorolac (ACULAR) 0.5 % ophthalmic solution SMARTSIG:In Eye(s)     losartan (COZAAR) 100 MG tablet Take 1 tablet (100 mg total) by mouth daily. 30 tablet 6   Multiple Vitamin (MULTIVITAMIN WITH MINERALS) TABS tablet Take 1 tablet by mouth daily with breakfast.     ondansetron (ZOFRAN ODT) 4 MG disintegrating tablet Take 1 tablet (4 mg total) by mouth every 8 (eight) hours as needed for nausea or vomiting. 20 tablet 0   prednisoLONE acetate (PRED FORTE) 1 % ophthalmic suspension SMARTSIG:In Eye(s)     VITAMIN D PO Take 1 tablet by mouth daily with breakfast.      chlorthalidone (HYGROTON) 25 MG tablet Take 1 tablet (25 mg total) by mouth daily. 90 tablet 3   No current facility-administered medications for this visit.    ROS:   General:  No weight loss, Fever, chills  HEENT: No recent headaches, no nasal bleeding, no visual changes, no sore throat  Neurologic: No dizziness, blackouts, seizures. No recent symptoms of stroke or mini- stroke. No recent episodes of slurred speech, or temporary blindness.  Cardiac: No recent episodes of chest pain/pressure, no shortness of breath at rest.  No shortness of breath with exertion.  Denies history of atrial fibrillation or irregular heartbeat  Vascular: No history of rest pain in feet.  positive history of claudication.  positive history of non-healing ulcer, No history of DVT   Pulmonary: No home oxygen, no productive cough, no hemoptysis,  No asthma or wheezing  Musculoskeletal:  [ ]  Arthritis, [ ]  Low back pain,  [ ]  Joint pain  Hematologic:No history of hypercoagulable state.  No history of easy bleeding.  No history of anemia  Gastrointestinal: No hematochezia or melena,  No gastroesophageal reflux, no trouble swallowing  Urinary: [ ]  chronic Kidney disease, [ ]  on HD -  [ ]  MWF or [ ]  TTHS, [ ]  Burning with urination, [ ]  Frequent urination, [ ]  Difficulty urinating;   Skin: No rashes  Psychological: No history of anxiety,  No history of depression   Physical Examination  Vitals:   04/04/21 0832 04/04/21 0834  BP: (!) 202/90 (!) 203/90  Pulse: (!) 55   Resp: 20   Temp: 97.9 F (36.6 C)   TempSrc: Temporal   SpO2: 100%   Weight: 157 lb (71.2 kg)   Height: 5\' 8"  (1.727 m)     Body mass index is 23.87 kg/m.  General:  Alert and oriented, no acute distress HEENT: Normal Neck: No bruit or JVD Pulmonary:  Clear to auscultation bilaterally Cardiac: Regular Rate and Rhythm without murmur Gastrointestinal: Soft, non-tender, non-distended, no mass, no scars Skin: No rash Vascular Exam/Pulses:   Right Left  Radial 2+ (normal) 2+ (normal)  Popliteal Unable to palpate Unable to palpate  DP 1+ (weak) with brisk doppler flow 1+ (weak) brisk doppler flow  PT Brisk doppler flow Brisk doppler flow   Musculoskeletal: No deformity or edema  Neurologic: Upper and lower extremity motor 5/5 and symmetric  DATA:     Right Carotid Findings:  +----------+--------+--------+--------+------------------+--------+            PSV cm/sEDV cm/sStenosisPlaque DescriptionComments  +----------+--------+--------+--------+------------------+--------+  CCA Prox  95      17                                          +----------+--------+--------+--------+------------------+--------+  CCA Mid   116     24      <50%    homogeneous                 +----------+--------+--------+--------+------------------+--------+  CCA Distal86      23              homogeneous                 +----------+--------+--------+--------+------------------+--------+  ICA Prox  107     21      1-39%   homogeneous                 +----------+--------+--------+--------+------------------+--------+  ICA Mid   106     25                                           +----------+--------+--------+--------+------------------+--------+  ICA Distal81      27                                          +----------+--------+--------+--------+------------------+--------+  ECA       90      9                                           +----------+--------+--------+--------+------------------+--------+   +----------+--------+-------+----------------+-------------------+            PSV cm/sEDV cmsDescribe        Arm Pressure (mmHG)  +----------+--------+-------+----------------+-------------------+  Subclavian101            Multiphasic, WNL                     +----------+--------+-------+----------------+-------------------+   +---------+--------+--+--------+--+---------+  VertebralPSV cm/s49EDV cm/s11Antegrade  +---------+--------+--+--------+--+---------+       Left Carotid Findings:  +----------+--------+--------+--------+------------------+--------+            PSV cm/sEDV cm/sStenosisPlaque DescriptionComments  +----------+--------+--------+--------+------------------+--------+  CCA Prox  144     19              homogeneous                 +----------+--------+--------+--------+------------------+--------+  CCA Mid   96      16  homogeneous                 +----------+--------+--------+--------+------------------+--------+  CCA Distal63      12              homogeneous                 +----------+--------+--------+--------+------------------+--------+  ICA Prox  55      12      1-39%   homogeneous                 +----------+--------+--------+--------+------------------+--------+  ICA Mid   61      22                                          +----------+--------+--------+--------+------------------+--------+  ICA Distal56      19                                          +----------+--------+--------+--------+------------------+--------+  ECA       109      9                                           +----------+--------+--------+--------+------------------+--------+   +----------+--------+--------+----------------+-------------------+            PSV cm/sEDV cm/sDescribe        Arm Pressure (mmHG)  +----------+--------+--------+----------------+-------------------+  [id number]             Multiphasic, WNL                     +----------+--------+--------+----------------+-------------------+   +---------+--------+---+--------+--+---------+  VertebralPSV cm/s105EDV cm/s32Antegrade  +---------+--------+---+--------+--+---------+          Summary:  Right Carotid: Velocities in the right ICA are consistent with a 1-39%  stenosis.                 Non-hemodynamically significant plaque <50% noted in the  CCA.   Left Carotid: Velocities in the left ICA are consistent with a 1-39%  stenosis.   Vertebrals:  Bilateral vertebral arteries demonstrate antegrade flow.  Subclavians: Normal flow hemodynamics were seen in bilateral subclavian               arteries.   ASSESSMENT:  Asymptomatic carotid stenosis B ICA < 39% PAD   PLAN: -pt will f/u in one year with ABI.  He knows to call sooner if he develops any rest pain or non healing wounds We reviewed symptoms of stroke and if these occur her will call 911.  F/U Carotid duplex in 2 years.  Roxy Horseman PA-C [facility] Office: [phone]  MD in clinic [place]

## 2021-04-16 ENCOUNTER — Encounter: Payer: Self-pay | Admitting: Internal Medicine

## 2021-04-16 ENCOUNTER — Other Ambulatory Visit: Payer: Self-pay

## 2021-04-16 ENCOUNTER — Ambulatory Visit: Payer: Medicare Other | Attending: Internal Medicine | Admitting: Internal Medicine

## 2021-04-16 VITALS — BP 219/96 | HR 64 | Resp 16 | Wt 152.2 lb

## 2021-04-16 DIAGNOSIS — E1121 Type 2 diabetes mellitus with diabetic nephropathy: Secondary | ICD-10-CM

## 2021-04-16 DIAGNOSIS — E1142 Type 2 diabetes mellitus with diabetic polyneuropathy: Secondary | ICD-10-CM

## 2021-04-16 DIAGNOSIS — N1832 Chronic kidney disease, stage 3b: Secondary | ICD-10-CM

## 2021-04-16 DIAGNOSIS — I10 Essential (primary) hypertension: Secondary | ICD-10-CM | POA: Diagnosis not present

## 2021-04-16 NOTE — Patient Instructions (Signed)
Please follow-up with [place] in 1 week for repeat blood pressure check.  Please bring all of your medications with you to that visit.  When you return home today, please take all of your blood pressure medications.  Please check to see if you have the medication at home called Farxiga 5 mg daily.  You should be taking this medication instead of metformin.

## 2021-04-16 NOTE — Progress Notes (Signed)
Patient ID: Douglas Briggs, male    DOB: 1961/04/01  MRN: [id number]  CC: Diabetes and Hypertension   Subjective: Douglas Briggs is a 60 y.o. male who presents for chronic ds management.  Last seen 3 months ago His concerns today include:  Patient with history of HTN, HL, DM type II with neuropathy and macroalbumin (4 gram 09/2020), CKD 3, anemia of chronic disease, history of TIA with evidence of stroke on MRI 10/2019 (neurology concern for possible embolic loop recorder placed 08/2020); PAD with chronic left SFA occlusion, amputation of LT 5th toe  Pt did not bring meds with him.  Depression:  had visit with LCSW 02/26/2021. Found it helpful but cancel last 2 f/u appts.  Would like to reschedule  DM: Lab Results  Component Value Date   HGBA1C 5.8 01/12/2021  -had cataract extraction on both eyes last mth 2 wks apart. RT eye still a little blurry -not checking BS. Metformin stopped on last visit and placed on Farxiga instead.  He stopped the Metformin but he is not sure if he has the [place] Reports poor appetite Goes to gym QOD, does a lot of stretching exercises and does some wgh lifting  HTN/PAD: No CP/SOB/HA/Dizziness.  No claudication symptoms.  Saw vascular surgeon PA recently.  ABI readings unchanged and patient asymptomatic. BP elev.  He did not take meds as yet for today and it is now close to 4 p.m Endorse having a weekly pill box which he sets up himself -He has CKD stage IIIb with significant proteinuria of 4 g in the urine.  On last visit he told me that he had an upcoming appointment with the nephrologist.  I asked him about whether he saw the kidney specialist since last visit with me and he is uncertain and does not recall.  HM: delines flu shot and shingles vaccine Patient Active Problem List   Diagnosis Date Noted   Stroke [facility])    Hypertension    Neuropathy    Protein-calorie malnutrition, mild ([place]) 09/11/2020   Hyperlipidemia associated with type 2 diabetes  mellitus ([place]) 09/11/2020   Diabetes mellitus due to underlying condition with retinopathy of both eyes ([place])    Hypertension associated with diabetes ([place])    NPDR (nonproliferative diabetic retinopathy) ([place]) 02/09/2020   Influenza vaccination declined 01/20/2020   Ulcer of left lower extremity with fat layer exposed ([place]) 01/20/2020   Cellulitis 12/19/2019   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   HLD (hyperlipidemia)    Normocytic anemia    CKD (chronic kidney disease)    Essential hypertension 11/22/2019   Atherosclerotic vascular disease 11/22/2019   Acute CVA (cerebrovascular accident) ([place]) 10/30/2019   TIA (transient ischemic attack) 10/29/2019   Stroke-like symptoms 10/29/2019   Right sided numbness    Acquired hammer toe of left foot 03/12/2017   Tinea pedis of both feet 03/12/2017   Male hypogonadism 11/14/2016   Restless leg syndrome 11/14/2016   Uncontrolled type 2 diabetes mellitus with diabetic polyneuropathy, without long-term current use of insulin 11/12/2016     Current Outpatient Medications on File Prior to Visit  Medication Sig Dispense Refill   aspirin EC 81 MG tablet Take 81 mg by mouth daily. Swallow whole.     atorvastatin (LIPITOR) 40 MG tablet Take 80 mg by mouth daily in the afternoon.     carvedilol (COREG) 3.125 MG tablet Take 1 tablet (3.125 mg total) by mouth 2 (two) times daily with a meal. 60 tablet 6   chlorthalidone (  HYGROTON) 25 MG tablet Take 1 tablet (25 mg total) by mouth daily. 90 tablet 3   dapagliflozin propanediol (FARXIGA) 5 MG TABS tablet Take 1 tablet (5 mg total) by mouth daily before breakfast. Stop Metformin 30 tablet 3   ezetimibe (ZETIA) 10 MG tablet Take 10 mg by mouth daily.     famotidine (PEPCID) 20 MG tablet Take 20 mg by mouth 2 (two) times daily as needed for heartburn or indigestion.     glimepiride (AMARYL) 1 MG tablet Take 1 tablet (1 mg total) by mouth daily with breakfast. 30 tablet 6   hydrALAZINE (APRESOLINE) 50 MG tablet  Take 1 tablet (50 mg total) by mouth 3 (three) times daily. STOP Clonidine 90 tablet 6   ketorolac (ACULAR) 0.5 % ophthalmic solution SMARTSIG:In Eye(s)     losartan (COZAAR) 100 MG tablet Take 1 tablet (100 mg total) by mouth daily. 30 tablet 6   Multiple Vitamin (MULTIVITAMIN WITH MINERALS) TABS tablet Take 1 tablet by mouth daily with breakfast.     ondansetron (ZOFRAN ODT) 4 MG disintegrating tablet Take 1 tablet (4 mg total) by mouth every 8 (eight) hours as needed for nausea or vomiting. 20 tablet 0   prednisoLONE acetate (PRED FORTE) 1 % ophthalmic suspension SMARTSIG:In Eye(s)     VITAMIN D PO Take 1 tablet by mouth daily with breakfast.      No current facility-administered medications on file prior to visit.    No Known Allergies  Social History   Socioeconomic History   Marital status: Single    Spouse name: Not on file   Number of children: Not on file   Years of education: Not on file   Highest education level: Not on file  Occupational History   Not on file  Tobacco Use   Smoking status: Never   Smokeless tobacco: Never  Vaping Use   Vaping Use: Never used  Substance and Sexual Activity   Alcohol use: No   Drug use: Never   Sexual activity: Not on file  Other Topics Concern   Not on file  Social History Narrative   Not on file   Social Determinants of Health   Financial Resource Strain: Not on file  Food Insecurity: Not on file  Transportation Needs: Not on file  Physical Activity: Not on file  Stress: Not on file  Social Connections: Not on file  Intimate Partner Violence: Not on file    Family History  Problem Relation Age of Onset   Hypertension Father    Cancer Father    Diabetes Paternal Aunt    Heart disease Neg Hx     Past Surgical History:  Procedure Laterality Date   AMPUTATION Left 12/20/2019   Procedure: AMPUTATION 5TH TOE;  Surgeon: Erle Crocker, MD;  Location: [facility];  Service: Orthopedics;  Laterality: Left;   BUBBLE STUDY   09/22/2020   Procedure: BUBBLE STUDY;  Surgeon: Donato Heinz, MD;  Location: [facility];  Service: Cardiovascular;;   LOOP RECORDER INSERTION N/A 09/22/2020   Procedure: LOOP RECORDER INSERTION;  Surgeon: Constance Haw, MD;  Location: [facility];  Service: Cardiovascular;  Laterality: N/A;   TEE WITHOUT CARDIOVERSION N/A 09/22/2020   Procedure: TRANSESOPHAGEAL ECHOCARDIOGRAM (TEE);  Surgeon: Donato Heinz, MD;  Location[facility] ENDOSCOPY;  Service: Cardiovascular;  Laterality: N/A;  LOOP    ROS: Review of Systems Negative except as stated above  PHYSICAL EXAM: BP (!) 219/96   Pulse 64   Resp  16   Wt 152 lb 3.2 oz (69 kg)   SpO2 100%   BMI 23.14 kg/m   Wt Readings from Last 3 Encounters:  04/16/21 152 lb 3.2 oz (69 kg)  04/04/21 157 lb (71.2 kg)  03/23/21 157 lb (71.2 kg)    Physical Exam  General appearance - alert, well appearing, and in no distress Mental status - normal mood, behavior, speech, dress, motor activity, and thought processes Neck - supple, no significant adenopathy Chest - clear to auscultation, no wheezes, rales or rhonchi, symmetric air entry Heart - normal rate, regular rhythm, normal S1, S2, no murmurs, rubs, clicks or gallops Extremities - peripheral pulses normal, no pedal edema, no clubbing or cyanosis   CMP Latest Ref Rng & Units 12/12/2020 10/31/2020 10/26/2020  Glucose 65 - 99 mg/dL 66 89 105(H)  BUN 8 - 27 mg/dL 31(H) 24 26  Creatinine 0.76 - 1.27 mg/dL 1.93(H) 2.02(H) 1.99(H)  Sodium 134 - 144 mmol/L 140 142 139  Potassium 3.5 - 5.2 mmol/L 5.1 4.9 5.5(H)  Chloride 96 - 106 mmol/L 110(H) 112(H) 108(H)  CO2 20 - 29 mmol/L 18(L) 21 21  Calcium 8.6 - 10.2 mg/dL 8.8 8.6 8.6  Total Protein 6.0 - 8.5 g/dL - - 5.5(L)  Total Bilirubin 0.0 - 1.2 mg/dL - - <0.2  Alkaline Phos 44 - 121 IU/L - - 120  AST 0 - 40 IU/L - - 12  ALT 0 - 44 IU/L - - 16   Lipid Panel     Component Value Date/Time   CHOL 404 (H) 10/26/2020 1058    TRIG 190 (H) 10/26/2020 1058   HDL 74 10/26/2020 1058   CHOLHDL 5.5 (H) 10/26/2020 1058   CHOLHDL 4.4 12/21/2019 0123   VLDL 23 12/21/2019 0123   LDLCALC 291 (H) 10/26/2020 1058    CBC    Component Value Date/Time   WBC 4.2 09/06/2020 1327   WBC 5.6 02/18/2020 1355   RBC 4.08 (L) 09/06/2020 1327   RBC 3.99 (L) 02/18/2020 1355   HGB 11.1 (L) 09/06/2020 1327   HCT 34.6 (L) 09/06/2020 1327   PLT 164 09/06/2020 1327   MCV 85 09/06/2020 1327   MCH 27.2 09/06/2020 1327   MCH 27.3 02/18/2020 1355   MCHC 32.1 09/06/2020 1327   MCHC 33.2 02/18/2020 1355   RDW 14.2 09/06/2020 1327   LYMPHSABS 1.3 12/22/2019 0424   MONOABS 0.5 12/22/2019 0424   EOSABS 0.2 12/22/2019 0424   BASOSABS 0.0 12/22/2019 0424    ASSESSMENT AND PLAN: 1. Type 2 diabetes mellitus with diabetic polyneuropathy, without long-term current use of insulin ([place]) Advised patient to check his bottles when he returns home to see whether he has the [place] 5 mg which she is supposed to be taking.  I wrote down this name for him.  If he does not have it, I told him to go ahead and call his pharmacy to get a refill - Lipid panel - Comprehensive metabolic panel  2. Essential hypertension Significantly elevated.  Discussed the importance of taking medications consistently.  Patient informed that persistent uncontrolled blood pressure is adversely affecting his kidneys.  He will take his medicines as soon as he returns home.  I will ask him to follow-up in 1 week with our clinical pharmacist for recheck him to bring his medicines with him.  He is supposed to be on hydralazine, chlorthalidone, Cozaar, and Coreg.  3. Stage 3b chronic kidney disease ([place]) Stressed importance of him getting in with a  kidney specialist.  I will resubmit the referral. - Ambulatory referral to Nephrology  4. Macroalbuminuric diabetic nephropathy ([place]) - Ambulatory referral to Nephrology   Patient was given the opportunity to ask questions.   Patient verbalized understanding of the plan and was able to repeat key elements of the plan.   No orders of the defined types were placed in this encounter.    Requested Prescriptions    No prescriptions requested or ordered in this encounter    No follow-ups on file.  Karle Plumber, MD, FACP

## 2021-04-17 LAB — LIPID PANEL
Chol/HDL Ratio: 5.3 ratio — ABNORMAL HIGH (ref 0.0–5.0)
Cholesterol, Total: 352 mg/dL — ABNORMAL HIGH (ref 100–199)
HDL: 66 mg/dL (ref >39)
LDL Chol Calc (NIH): 247 mg/dL — ABNORMAL HIGH (ref 0–99)
Triglycerides: 197 mg/dL — ABNORMAL HIGH (ref 0–149)
VLDL Cholesterol Cal: 39 mg/dL (ref 5–40)

## 2021-04-17 LAB — COMPREHENSIVE METABOLIC PANEL
ALT: 17 IU/L (ref 0–44)
AST: 14 IU/L (ref 0–40)
Albumin/Globulin Ratio: 0.9 — ABNORMAL LOW (ref 1.2–2.2)
Albumin: 2.7 g/dL — ABNORMAL LOW (ref 3.8–4.9)
Alkaline Phosphatase: 125 IU/L — ABNORMAL HIGH (ref 44–121)
BUN/Creatinine Ratio: 13 (ref 10–24)
BUN: 26 mg/dL (ref 8–27)
Bilirubin Total: <0.2 mg/dL (ref 0.0–1.2)
CO2: 21 mmol/L (ref 20–29)
Calcium: 8.4 mg/dL — ABNORMAL LOW (ref 8.6–10.2)
Chloride: 111 mmol/L — ABNORMAL HIGH (ref 96–106)
Creatinine, Ser: 2.04 mg/dL — ABNORMAL HIGH (ref 0.76–1.27)
Globulin, Total: 2.9 g/dL (ref 1.5–4.5)
Glucose: 76 mg/dL (ref 70–99)
Potassium: 4.9 mmol/L (ref 3.5–5.2)
Sodium: 140 mmol/L (ref 134–144)
Total Protein: 5.6 g/dL — ABNORMAL LOW (ref 6.0–8.5)
eGFR: 37 mL/min/{1.73_m2} — ABNORMAL LOW (ref >59)

## 2021-04-18 ENCOUNTER — Other Ambulatory Visit: Payer: Self-pay | Admitting: Internal Medicine

## 2021-04-18 MED ORDER — ATORVASTATIN CALCIUM 40 MG PO TABS: 80.0000 mg | ORAL_TABLET | Freq: Every day | ORAL | 5 refills | Status: DC

## 2021-04-18 NOTE — Progress Notes (Signed)
Let pt know that his cholesterol level is still very high.  Please confirm whether he is taking Lipitor (Atorvastatin) consistently.  I have sent refill to his pharmacy.  Kidney function is not 100% but stable.

## 2021-04-23 ENCOUNTER — Telehealth: Payer: Self-pay

## 2021-04-23 LAB — CUP PACEART REMOTE DEVICE CHECK
Date Time Interrogation Session: 20221126201211
Implantable Pulse Generator Implant Date: 20220429

## 2021-04-23 NOTE — Telephone Encounter (Signed)
Contacted pt to go over lab results pt is aware. Pt states he hasn't been taking Liptor consistently. Pt states he will pick up rx. Pt doesn't have any questions or concerns  Provided pt with info for Nephrologist

## 2021-04-23 NOTE — Telephone Encounter (Signed)
Attempted to call this pt to reschedule appt, no answer, left vm.

## 2021-04-25 ENCOUNTER — Telehealth: Payer: Self-pay | Admitting: Internal Medicine

## 2021-04-25 NOTE — Telephone Encounter (Signed)
-----   Message from Ena Dawley sent at 04/25/2021 10:29 AM EST ----- Regarding: RE: Nephrology referral  pt no showed 12/15/20. [facility]" . They want him to contact them to schedule an appointment Pilar Plate said on Apr 17, 2021 9:22 AM ----- Message ----- From: Ladell Pier, MD Sent: 04/16/2021   4:54 PM EST To: Ena Dawley Subject: Nephrology referral                            Can you please check with [facility] to see whether they have seen this patient as yet.  When I saw him in August he told me that he had an appointment coming up with them in a few weeks.  Now today he tells me he is not sure whether he is seeing them.

## 2021-04-30 ENCOUNTER — Ambulatory Visit (INDEPENDENT_AMBULATORY_CARE_PROVIDER_SITE_OTHER): Payer: Medicare Other

## 2021-04-30 DIAGNOSIS — I639 Cerebral infarction, unspecified: Secondary | ICD-10-CM | POA: Diagnosis not present

## 2021-04-30 NOTE — Telephone Encounter (Signed)
Contacted pt and went over provider message pt states his new appt is set for 05/10/21

## 2021-05-01 ENCOUNTER — Encounter: Payer: Self-pay | Admitting: Pharmacist

## 2021-05-01 ENCOUNTER — Ambulatory Visit: Payer: Medicare Other | Attending: Internal Medicine | Admitting: Pharmacist

## 2021-05-01 ENCOUNTER — Other Ambulatory Visit: Payer: Self-pay

## 2021-05-01 VITALS — BP 204/87 | HR 74

## 2021-05-01 DIAGNOSIS — I10 Essential (primary) hypertension: Secondary | ICD-10-CM | POA: Diagnosis not present

## 2021-05-01 NOTE — Progress Notes (Signed)
   S:    Patient arrives in good spirits. Presents to the clinic for hypertension evaluation, counseling, and management. Patient was referred and last seen by Primary Care Provider on 04/16/2021. BP was elevated at that visit and he was referred to me for BP check and medication reconciliation.   Medication adherence: pt brings in a bag of all his medications. He tells me he is taking everything in the bag but admits to missing his medications ~2-3 days per week. Please see below.   Current BP Medications include:  carvedilol 3.125 mg BID, chlorthalidone 25 mg daily, hydralazine 50 mg TID, losartan 100 mg daily  -Of note, his clonidine was stopped by Dr. Wynetta Emery at a previous visit. He brings me a bottle today and tells me he is taking this.  -His amlodipine was stopped by his Cardiologist d/t pedal edema, however, he brings me a bottle today and tells me he is taking this.   Dietary habits include: reports compliance with salt restriction but admits to eating things like [place] wings that he prepares himself. He denies intake of caffeine.  Exercise habits include:tells me he goes to the gym about every other day.  Family / Social history:  Fhx: HTN, DM Tobacco: never smoker  Alcohol: denies use     O:  Vitals:   05/01/21 1448  BP: (!) 204/87  Pulse: 74   Home BP readings: none given   Last 3 Office BP readings: BP Readings from Last 3 Encounters:  05/01/21 (!) 204/87  04/16/21 (!) 219/96  04/04/21 (!) 203/90    BMET    Component Value Date/Time   NA 140 04/16/2021 1614   K 4.9 04/16/2021 1614   CL 111 (H) 04/16/2021 1614   CO2 21 04/16/2021 1614   GLUCOSE 76 04/16/2021 1614   GLUCOSE 157 (H) 02/18/2020 1355   BUN 26 04/16/2021 1614   CREATININE 2.04 (H) 04/16/2021 1614   CALCIUM 8.4 (L) 04/16/2021 1614   GFRNONAA 48 (L) 04/14/2020 1543   GFRAA 55 (L) 04/14/2020 1543    Renal function: CrCl cannot be calculated (Unknown ideal weight.).  Clinical ASCVD: Yes  The  ASCVD Risk score (Arnett DK, et al., 2019) failed to calculate for the following reasons:   The patient has a prior MI or stroke diagnosis    A/P: Hypertension diagnosed currently uncontrolled on current medications. BP Goal = < 130/80 mmHg. Medication adherence: see above. I will have him stop clonidine. With him missing doses of this, it may be contributing to rebound hypertension. He denies any LE edema today after taking amlodipine. I will have him continue this with his other medications.  He agreed to try and take these daily without missing doses and return in 1 week for recheck.  -Discontinued clonidine. -Continue amlodipine 10 mg daily, carvedilol 3.125 mg BID, chlorthalidone 25 mg daily, hydralazine 50 mg TID, losartan 100 mg daily  -Counseled on lifestyle modifications for blood pressure control including reduced dietary sodium, increased exercise, adequate sleep.  Results reviewed and written information provided.   Total time in face-to-face counseling 30 minutes.   F/U Clinic Visit in 1 week.    Benard Halsted, PharmD, [place], [place] [phone]

## 2021-05-08 NOTE — Progress Notes (Signed)
° °  S:    Patient presents to the clinic for hypertension evaluation, counseling, and management. Patient was referred and last seen by Primary Care Provider on 04/16/2021. Last seen by CPP 05/01/21. BP elevated but discontinued clonidine at that time due to nonadherence and risk for rebound HTN. Close follow up scheduled for 1 week to assess BP while adherent to other medications.   Today, patient arrives in good spirits. Says he has been feeling good since stopping clonidine last week. Denies dizziness, headaches. Endorses swelling in feet every now and then. Has appt with nephrologist soon. He helps take care of his mother and wants his sisters to help him more because this contributes to increased stress for him.   Medication adherence reported with no missed doses in the last week. He reports taking his medications this morning.   Current BP Medications include:  carvedilol 3.125 mg BID, chlorthalidone 25 mg daily, hydralazine 50 mg TID, losartan 100 mg daily, amlodipine 10 mg daily  Dietary habits include: Reports compliance with salt restriction. Says he ate some salmon today. He denies intake of caffeine.  Exercise habits include: He goes to the gym about every other day. Family / Social history:  Fhx: HTN, DM  Tobacco: never smoker  Alcohol: denies use   O:  Vitals:   05/09/21 1434  BP: (!) 193/90  Pulse: 73   Home BP readings: 154/80  Last 3 Office BP readings: BP Readings from Last 3 Encounters:  05/09/21 (!) 193/90  05/01/21 (!) 204/87  04/16/21 (!) 219/96   BMET    Component Value Date/Time   NA 140 04/16/2021 1614   K 4.9 04/16/2021 1614   CL 111 (H) 04/16/2021 1614   CO2 21 04/16/2021 1614   GLUCOSE 76 04/16/2021 1614   GLUCOSE 157 (H) 02/18/2020 1355   BUN 26 04/16/2021 1614   CREATININE 2.04 (H) 04/16/2021 1614   CALCIUM 8.4 (L) 04/16/2021 1614   GFRNONAA 48 (L) 04/14/2020 1543   GFRAA 55 (L) 04/14/2020 1543   Renal function: CrCl cannot be calculated  (Patient's most recent lab result is older than the maximum 21 days allowed.).  Clinical ASCVD: Yes  The ASCVD Risk score (Arnett DK, et al., 2019) failed to calculate for the following reasons:   The patient has a prior MI or stroke diagnosis   A/P: Hypertension diagnosed currently uncontrolled on current medications. BP Goal = < 130/80 mmHg. Suspect some element of non-adherence or forgetfulness. Will increase carvedilol for now and have him follow up with nephrology for adjustment of other medications given renal function. Emphasized importance of adherence. Used teach back method to confirm he understood carvedilol dose change.  -Increase carvedilol 6.25 mg BID -Continue amlodipine 10 mg daily, chlorthalidone 25 mg daily, hydralazine 50 mg TID, losartan 100 mg daily  -Counseled on lifestyle modifications for blood pressure control including reduced dietary sodium, increased exercise, adequate sleep.   Total time in face-to-face counseling 30 minutes. F/u PCP visit 06/21/21.    Rebbeca Paul, PharmD PGY2 Ambulatory Care Pharmacy Resident 05/09/2021 3:41 PM

## 2021-05-09 ENCOUNTER — Other Ambulatory Visit: Payer: Self-pay

## 2021-05-09 ENCOUNTER — Ambulatory Visit: Payer: Medicare Other | Attending: Internal Medicine | Admitting: Pharmacist

## 2021-05-09 VITALS — BP 193/90 | HR 73

## 2021-05-09 DIAGNOSIS — I10 Essential (primary) hypertension: Secondary | ICD-10-CM | POA: Diagnosis not present

## 2021-05-09 MED ORDER — CARVEDILOL 6.25 MG PO TABS: 6.2500 mg | ORAL_TABLET | Freq: Two times a day (BID) | ORAL | 2 refills | Status: DC

## 2021-05-09 NOTE — Progress Notes (Signed)
Carelink Summary Report / Loop Recorder 

## 2021-05-22 IMAGING — DX DG FOOT COMPLETE 3+V*L*
3 series · 3 of 3 positions shown · non-contrast
Comparison: None.

CLINICAL DATA: Left fifth digit pain

EXAM:
LEFT FOOT - COMPLETE 3+ VIEW

[foot ap]
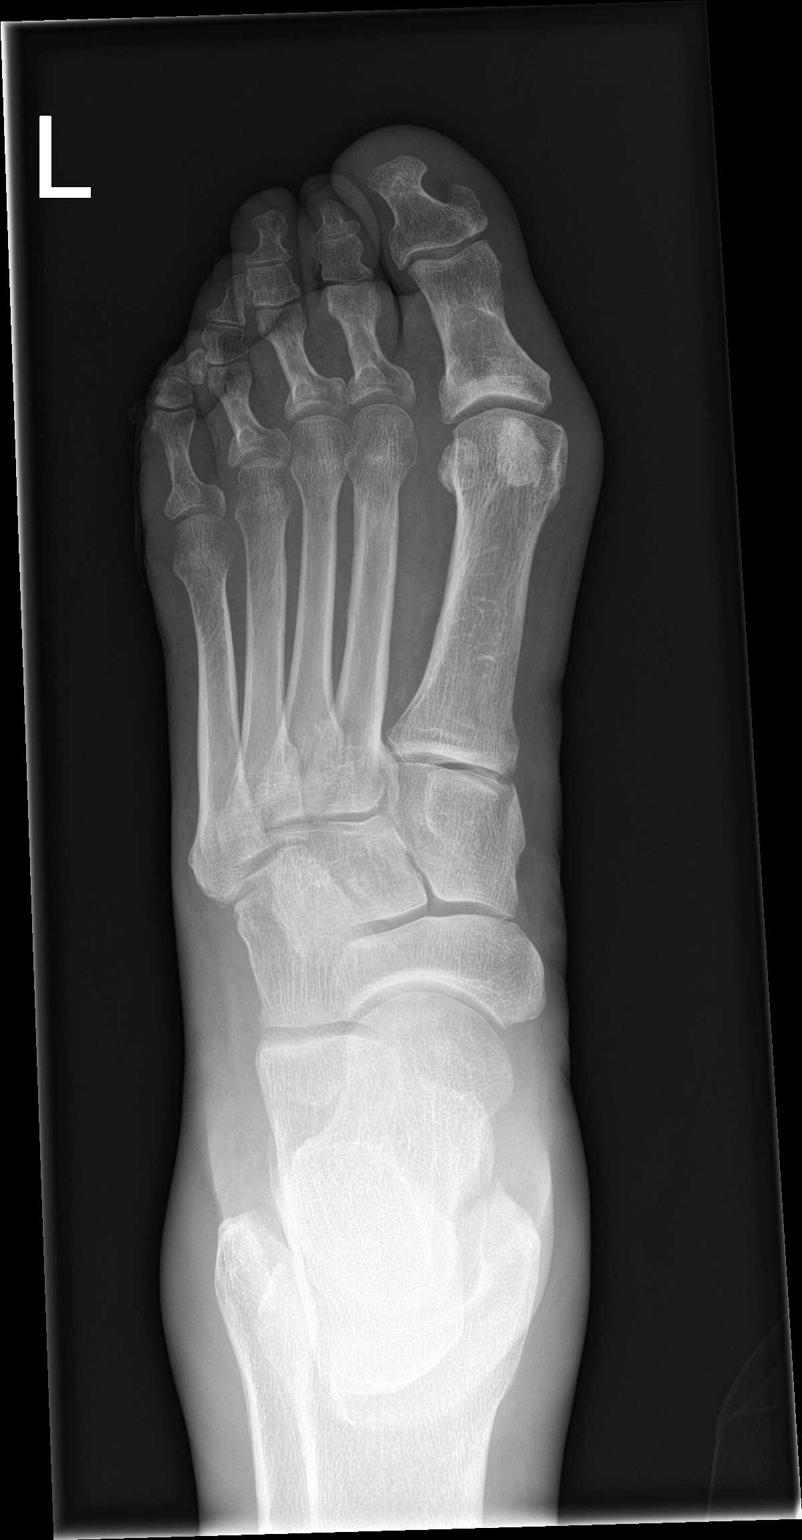

[foot obl]
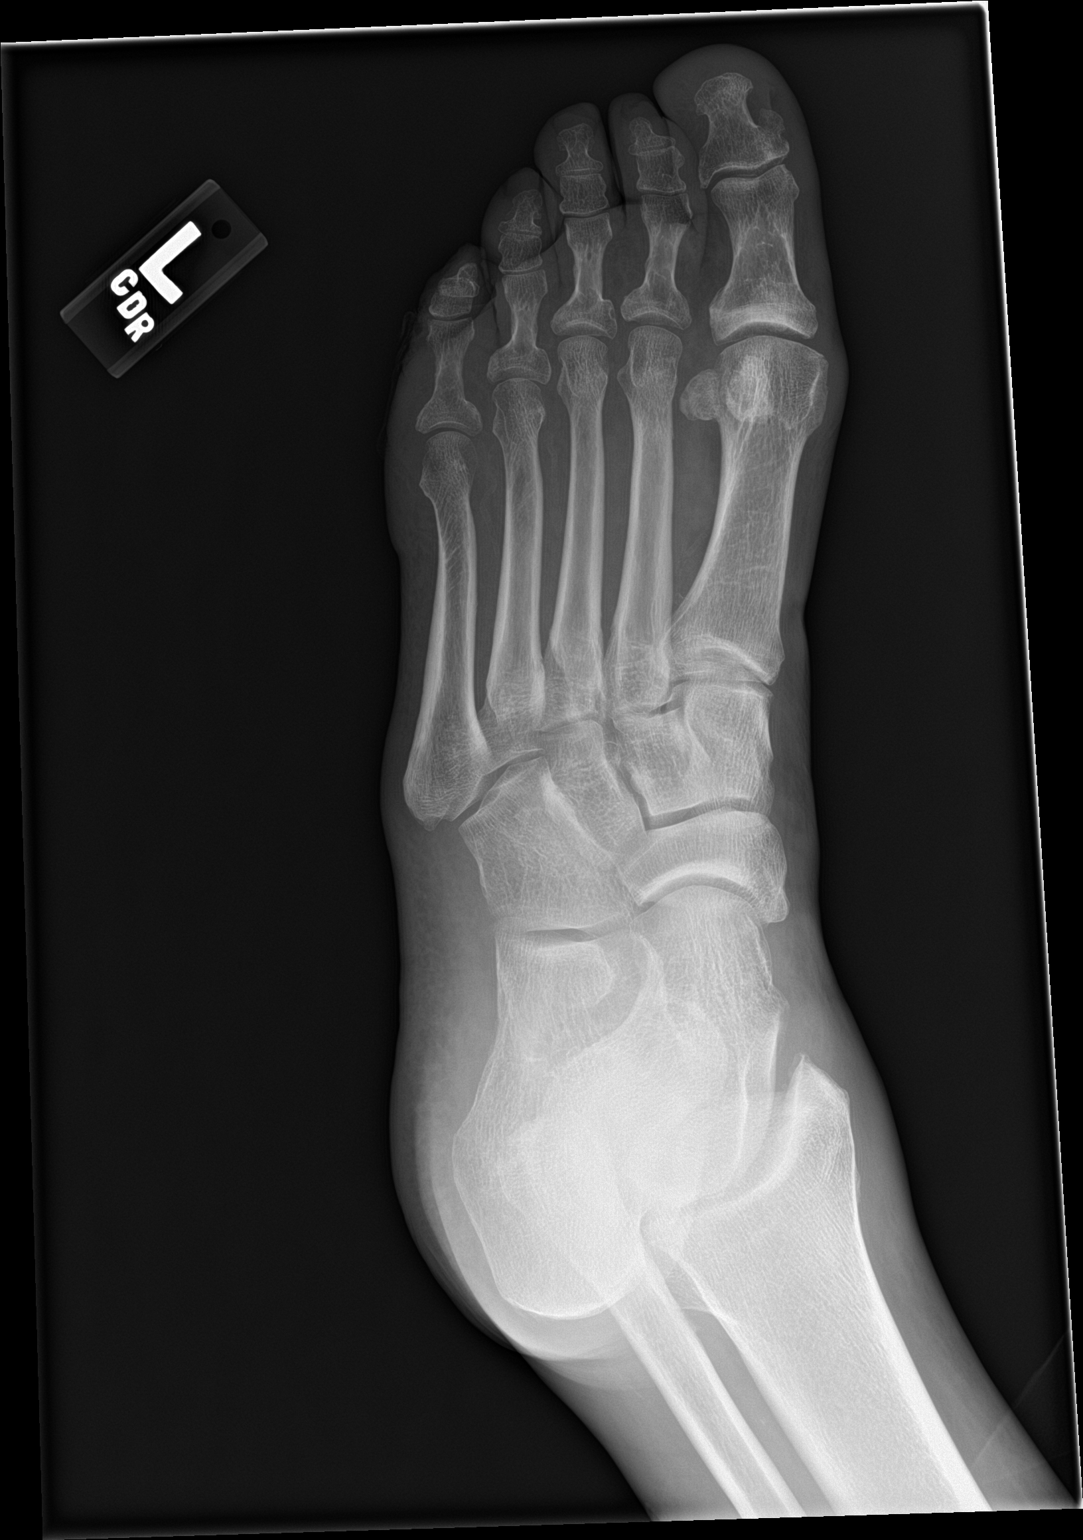

[foot lat]
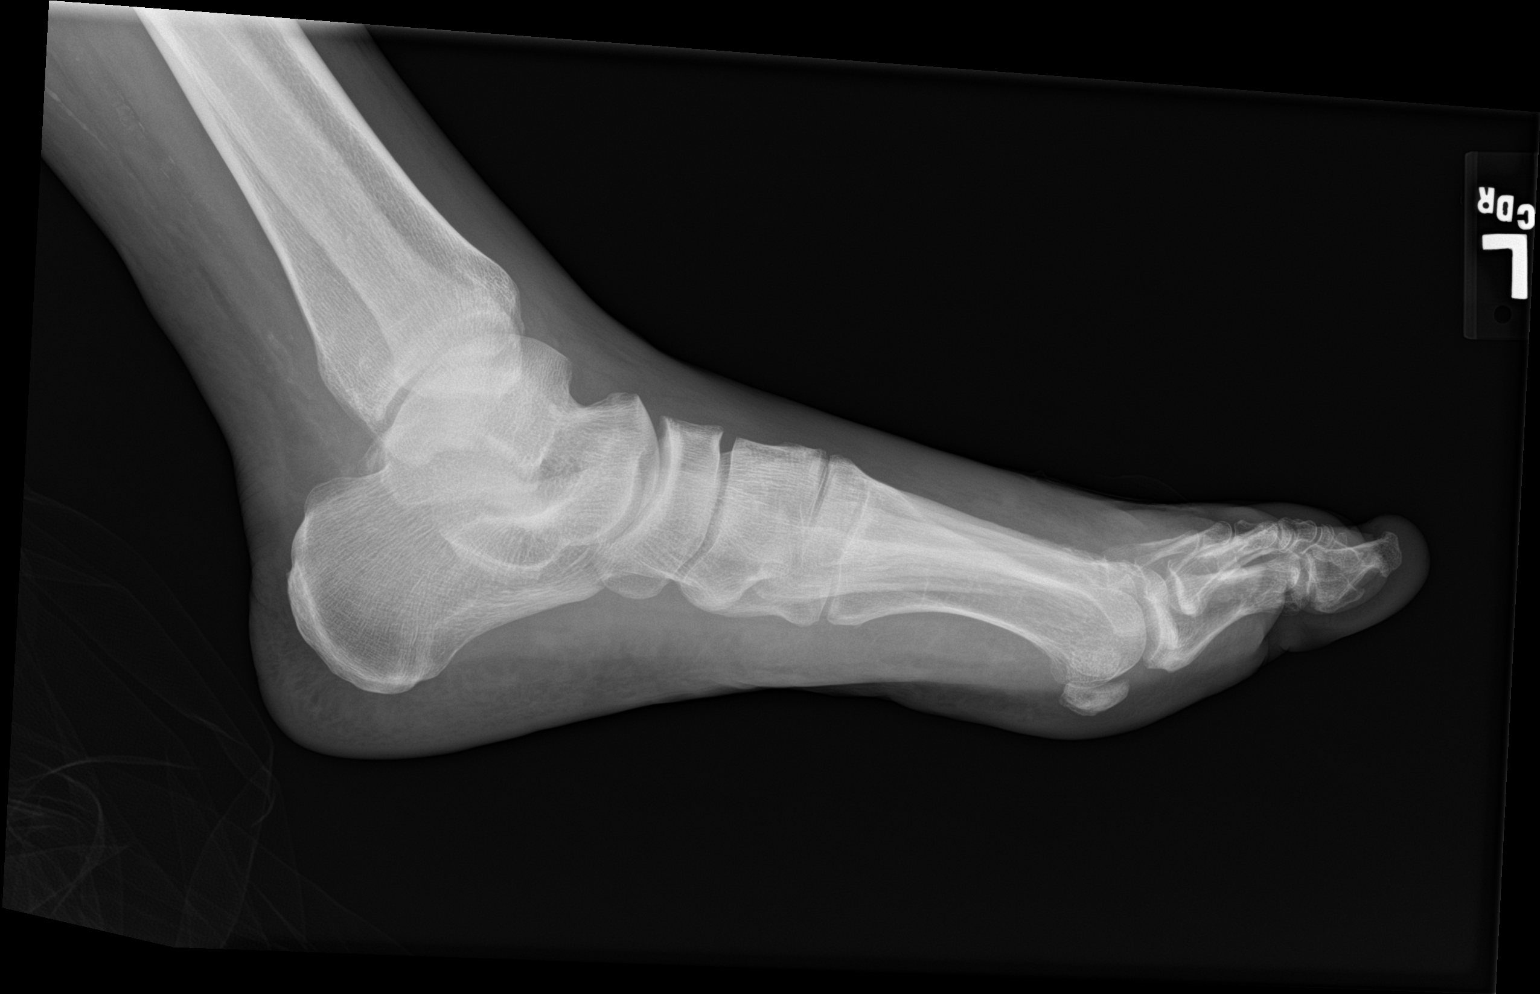

[3 of 3 positions shown; findings below may reference images not displayed]

FINDINGS: Frontal, oblique, and lateral views of the left foot are obtained.
There are no acute displaced fractures.

There is decreased soft tissue of the fifth digit, concerning for
necrosis. Minimal subcutaneous gas is seen within the lateral aspect
of the fifth digit. There is subtle cortical erosion lateral margin
distal aspect fifth proximal phalanx.
IMPRESSION: 1. Findings concerning for gangrene of the fifth digit left foot,
with subcutaneous gas and underlying fifth proximal phalangeal
cortical destruction. Superimposed infection could also give this
appearance.

## 2021-05-30 ENCOUNTER — Ambulatory Visit: Payer: Medicaid Other | Admitting: Clinical

## 2021-05-30 ENCOUNTER — Telehealth: Payer: Self-pay | Admitting: Internal Medicine

## 2021-05-30 NOTE — Telephone Encounter (Signed)
Copied from [place] [id number]. Topic: Appointment Scheduling - Scheduling Inquiry for Clinic >> May 30, 2021  9:41 AM Valere Dross wrote: Reason for CRM: Pt called in wanting to reschedule his appt today at 1:30 with Ashante McCoy , please advise.

## 2021-06-04 ENCOUNTER — Ambulatory Visit (INDEPENDENT_AMBULATORY_CARE_PROVIDER_SITE_OTHER): Payer: Medicare Other

## 2021-06-04 DIAGNOSIS — I639 Cerebral infarction, unspecified: Secondary | ICD-10-CM | POA: Diagnosis not present

## 2021-06-04 LAB — CUP PACEART REMOTE DEVICE CHECK
Date Time Interrogation Session: 20230108230754
Implantable Pulse Generator Implant Date: 20220429

## 2021-06-11 ENCOUNTER — Ambulatory Visit: Payer: Medicare Other | Admitting: Clinical

## 2021-06-11 ENCOUNTER — Telehealth: Payer: Self-pay | Admitting: Internal Medicine

## 2021-06-11 NOTE — Telephone Encounter (Signed)
Appt has been reschedule already

## 2021-06-11 NOTE — Telephone Encounter (Signed)
Copied from [place] [id number]. Topic: Appointment Scheduling - Scheduling Inquiry for Clinic >> Jun 11, 2021 10:43 AM Oneta Rack wrote: Patient would like to [facility] his 11:30am appointment scheduled for today with Asante McCoy.

## 2021-06-12 NOTE — Progress Notes (Signed)
Carelink Summary Report / Loop Recorder 

## 2021-06-18 ENCOUNTER — Other Ambulatory Visit: Payer: Self-pay | Admitting: Nephrology

## 2021-06-18 DIAGNOSIS — I10 Essential (primary) hypertension: Secondary | ICD-10-CM

## 2021-06-18 DIAGNOSIS — R809 Proteinuria, unspecified: Secondary | ICD-10-CM

## 2021-06-18 DIAGNOSIS — N1832 Chronic kidney disease, stage 3b: Secondary | ICD-10-CM

## 2021-06-20 ENCOUNTER — Other Ambulatory Visit: Payer: Self-pay

## 2021-06-20 ENCOUNTER — Ambulatory Visit: Payer: Medicare Other | Attending: Internal Medicine | Admitting: Clinical

## 2021-06-20 ENCOUNTER — Ambulatory Visit: Payer: Medicare Other

## 2021-06-20 DIAGNOSIS — F4323 Adjustment disorder with mixed anxiety and depressed mood: Secondary | ICD-10-CM

## 2021-06-21 ENCOUNTER — Ambulatory Visit: Payer: Medicare Other | Attending: Internal Medicine | Admitting: Internal Medicine

## 2021-06-21 DIAGNOSIS — Z91199 Patient's noncompliance with other medical treatment and regimen due to unspecified reason: Secondary | ICD-10-CM

## 2021-06-21 NOTE — Progress Notes (Signed)
Patient no showed this telephone visit with me today.  I left a voicemail message on his cell phone and his home phone to call us back.  Message sent to scheduling to get him rescheduled.

## 2021-06-22 ENCOUNTER — Telehealth: Payer: Self-pay

## 2021-06-22 NOTE — Telephone Encounter (Signed)
Pt has been scheduled.  °

## 2021-06-22 NOTE — Telephone Encounter (Signed)
-----   Message from Ladell Pier, MD sent at 06/21/2021  5:08 PM EST ----- Patient no showed his telephone appointment today.  Please reschedule him with me for in person appointment for chronic disease management follow-up.

## 2021-06-25 ENCOUNTER — Other Ambulatory Visit: Payer: Medicare Other

## 2021-06-26 NOTE — BH Specialist Note (Signed)
Integrated Behavioral Health via Telemedicine Visit  06/20/2021 Douglas Briggs [id number]  Number of [place] visits: 2 Session Start time: 10:00am  Session End time: 10:15am Total time: 15  Referring Provider: Karle Plumber, MD Patient/Family location: Home [facility] Provider location: Home All persons participating in visit: Pt and LCSWA Types of Service: Individual psychotherapy and Telephone visit  I connected with Douglas Briggs via Telephone and verified that I am speaking with the correct person using two identifiers. Discussed confidentiality: Yes   I discussed the limitations of telemedicine and the availability of in person appointments.  Discussed there is a possibility of technology failure and discussed alternative modes of communication if that failure occurs.  I discussed that engaging in this telemedicine visit, they consent to the provision of behavioral healthcare and the services will be billed under their insurance.  Patient and/or legal guardian expressed understanding and consented to Telemedicine visit: Yes   Presenting Concerns: Patient and/or family reports the following symptoms/concerns: Reports an improvement in mood. Reports that he has adjusted to physical health changes. Reports that he has been trying to keep himself busy.  Duration of problem: 9 months; Severity of problem: moderate  Patient and/or Family's Strengths/Protective Factors: Concrete supports in place (healthy food, safe environments, etc.)  Goals Addressed: Patient will:  Reduce symptoms of: anxiety and depression   Increase knowledge and/or ability of: coping skills   Demonstrate ability to: Increase healthy adjustment to current life circumstances  Progress towards Goals: Achieved  Interventions: Interventions utilized:  Mindfulness or Relaxation Training and Supportive Counseling Standardized Assessments completed: Not Needed  Patient and/or Family Response: Pt  receptive to tx. Pt receptive to affirmation provided on pt's progress. Pt receptive to continuing to spend time out of the house and with family.   Assessment: Denies SI/HI. Patient currently experiencing an improvement in anxiety and depression. Pt appears to have adjusted to physical health changes. Pt engages in healthy coping skills by spending time outside of the home and time with family. Pt has set boundaries with people so that he is not working on cars.   Patient may benefit from continuing healthy coping skills. LCSWA provided affirmation for pt's progress. LCSWA encouraged pt to continue healthy coping skills. LCSWA encouraged pt to fu if needed.  Plan: Follow up with behavioral health clinician on : PRN Behavioral recommendations: Continue healthy coping skills Referral(s): [place] (In Clinic)  I discussed the assessment and treatment plan with the patient and/or parent/guardian. They were provided an opportunity to ask questions and all were answered. They agreed with the plan and demonstrated an understanding of the instructions.   They were advised to call back or seek an in-person evaluation if the symptoms worsen or if the condition fails to improve as anticipated.  Kristle Wesch C Yeslin Delio, LCSW

## 2021-06-27 LAB — HM DIABETES EYE EXAM

## 2021-07-07 LAB — CUP PACEART REMOTE DEVICE CHECK
Date Time Interrogation Session: 20230210230624
Implantable Pulse Generator Implant Date: 20220429

## 2021-07-09 ENCOUNTER — Ambulatory Visit (INDEPENDENT_AMBULATORY_CARE_PROVIDER_SITE_OTHER): Payer: Medicare Other

## 2021-07-09 DIAGNOSIS — I639 Cerebral infarction, unspecified: Secondary | ICD-10-CM

## 2021-07-11 DIAGNOSIS — E113312 Type 2 diabetes mellitus with moderate nonproliferative diabetic retinopathy with macular edema, left eye: Secondary | ICD-10-CM | POA: Diagnosis not present

## 2021-07-11 NOTE — Progress Notes (Signed)
Carelink Summary Report / Loop Recorder 

## 2021-07-17 ENCOUNTER — Other Ambulatory Visit: Payer: Medicare Other

## 2021-07-19 ENCOUNTER — Ambulatory Visit: Payer: Medicare Other | Admitting: Internal Medicine

## 2021-07-19 ENCOUNTER — Other Ambulatory Visit: Payer: Self-pay

## 2021-07-27 ENCOUNTER — Ambulatory Visit: Payer: Medicaid Other | Admitting: Cardiology

## 2021-08-01 ENCOUNTER — Ambulatory Visit (HOSPITAL_BASED_OUTPATIENT_CLINIC_OR_DEPARTMENT_OTHER): Admission: RE | Admit: 2021-08-01 | Payer: Medicare Other | Source: Ambulatory Visit

## 2021-08-03 ENCOUNTER — Ambulatory Visit: Payer: Medicare Other | Attending: Internal Medicine | Admitting: Internal Medicine

## 2021-08-03 ENCOUNTER — Encounter: Payer: Self-pay | Admitting: Internal Medicine

## 2021-08-03 ENCOUNTER — Other Ambulatory Visit: Payer: Self-pay

## 2021-08-03 VITALS — BP 189/91 | HR 66 | Resp 18 | Ht 68.0 in | Wt 157.5 lb

## 2021-08-03 DIAGNOSIS — Z0000 Encounter for general adult medical examination without abnormal findings: Secondary | ICD-10-CM | POA: Diagnosis not present

## 2021-08-03 DIAGNOSIS — I10 Essential (primary) hypertension: Secondary | ICD-10-CM | POA: Diagnosis not present

## 2021-08-03 DIAGNOSIS — E1142 Type 2 diabetes mellitus with diabetic polyneuropathy: Secondary | ICD-10-CM

## 2021-08-03 DIAGNOSIS — Z23 Encounter for immunization: Secondary | ICD-10-CM | POA: Diagnosis not present

## 2021-08-03 LAB — POCT GLYCOSYLATED HEMOGLOBIN (HGB A1C): Hemoglobin A1C: 5.8 % — AB (ref 4.0–5.6)

## 2021-08-03 NOTE — Patient Instructions (Signed)
Please check your blood pressure at least twice a week.  The goal is 130/80 or lower.  Please bring your readings with you when you come to see me in a few weeks for repeat blood pressure check. ? ?Please bring all medications with you on your next visit so that we can do a medication reconciliation. ? ?Please give your eye doctor my business card so that he can send me information about your eye disease and the type of treatments that you are currently receiving. ?

## 2021-08-03 NOTE — Progress Notes (Signed)
Subjective:   Douglas Briggs is a 61 y.o. male who presents for a Welcome to Medicare exam.  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  Reports he ate some salty ham yesterday.  Reports compliance with meds but does not know names and does not have them with him; took already for today. Has home BP device but not checking BP   CKD:  has 2nd visit with nephrology next wk  ENDO:  not checking BS.  Should be on Farxiga and Amaryl.  Reports he does not take his DM meds every day.  Trying to control DM with eating healthy and exercise    Results for orders placed or performed in visit on 08/03/21  POCT glycosylated hemoglobin (Hb A1C)  Result Value Ref Range   Hemoglobin A1C 5.8 (A) 4.0 - 5.6 %   HbA1c POC (<> result, manual entry)     HbA1c, POC (prediabetic range)     HbA1c, POC (controlled diabetic range)      Objective:    Today's Vitals   08/03/21 1447  BP: (!) 189/91  Pulse: 66  Resp: 18  SpO2: 100%  Weight: 157 lb 8 oz (71.4 kg)  Height: 5\' 8"  (1.727 m)   Body mass index is 23.95 kg/m. Wt Readings from Last 3 Encounters:  08/03/21 157 lb 8 oz (71.4 kg)  04/16/21 152 lb 3.2 oz (69 kg)  04/04/21 157 lb (71.2 kg)    Medications Outpatient Encounter Medications as of 08/03/2021  Medication Sig   aspirin EC 81 MG tablet Take 81 mg by mouth daily. Swallow whole.   atorvastatin (LIPITOR) 40 MG tablet Take 2 tablets (80 mg total) by mouth daily in the afternoon.   carvedilol (COREG) 6.25 MG tablet Take 1 tablet (6.25 mg total) by mouth 2 (two) times daily with a meal.   dapagliflozin propanediol (FARXIGA) 5 MG TABS tablet Take 1 tablet (5 mg total) by mouth daily  before breakfast. Stop Metformin   ezetimibe (ZETIA) 10 MG tablet Take 10 mg by mouth daily.   famotidine (PEPCID) 20 MG tablet Take 20 mg by mouth 2 (two) times daily as needed for heartburn or indigestion.   glimepiride (AMARYL) 1 MG tablet Take 1 tablet (1 mg total) by mouth daily with breakfast.   hydrALAZINE (APRESOLINE) 50 MG tablet Take 1 tablet (50 mg total) by mouth 3 (three) times daily. STOP Clonidine   ketorolac (ACULAR) 0.5 % ophthalmic solution SMARTSIG:In Eye(s)   losartan (COZAAR) 100 MG tablet Take 1 tablet (100 mg total) by mouth daily.   Multiple Vitamin (MULTIVITAMIN WITH MINERALS) TABS tablet Take 1 tablet by mouth daily with breakfast.   ondansetron (ZOFRAN ODT) 4 MG disintegrating tablet Take 1 tablet (4 mg total) by mouth every 8 (eight) hours as needed for nausea or vomiting.   prednisoLONE acetate (PRED FORTE) 1 % ophthalmic suspension SMARTSIG:In Eye(s)   VITAMIN D PO Take 1 tablet by mouth daily with breakfast.    chlorthalidone (HYGROTON) 25 MG tablet Take 1 tablet (25 mg total) by mouth daily.   No facility-administered encounter medications on file as of 08/03/2021.     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 Dr. Tacy Dura - seen  11/24/19   Protein-calorie malnutrition, mild ([place]) 09/11/2020   Restless leg syndrome 11/14/2016   Right sided numbness    Stroke ([place])    Stroke-like symptoms 10/29/2019   TIA (transient ischemic attack) 10/29/2019   Tinea pedis of both feet 03/12/2017   Ulcer of left lower extremity with fat layer exposed ([place]) 01/20/2020   Uncontrolled type 2 diabetes mellitus with diabetic polyneuropathy, without long-term current use of insulin 11/12/2016   Past Surgical History:  Procedure Laterality Date   AMPUTATION Left 12/20/2019   Procedure: AMPUTATION 5TH TOE;  Surgeon: Erle Crocker, MD;  Location: [place];  Service: Orthopedics;  Laterality: Left;   BUBBLE STUDY  09/22/2020   Procedure: BUBBLE STUDY;  Surgeon: Donato Heinz, MD;  Location: [place];  Service: Cardiovascular;;   CATARACT EXTRACTION Bilateral 02/2021   LOOP RECORDER INSERTION N/A 09/22/2020   Procedure: LOOP RECORDER INSERTION;  Surgeon: Constance Haw, MD;  Location: [place] CV LAB;  Service: Cardiovascular;  Laterality: N/A;   TEE WITHOUT CARDIOVERSION N/A 09/22/2020   Procedure: TRANSESOPHAGEAL ECHOCARDIOGRAM (TEE);  Surgeon: Donato Heinz, MD;  Location: [facility] ENDOSCOPY;  Service: Cardiovascular;  Laterality: N/A;  LOOP    Family History  Problem Relation Age of Onset   Hypertension Father    Cancer Father    Diabetes Paternal Aunt    Heart disease Neg Hx    Social History   Occupational History   Not on file  Tobacco Use   Smoking status: Never   Smokeless tobacco: Never  Vaping Use   Vaping Use: Never used  Substance and Sexual Activity   Alcohol use: No   Drug use: Never   Sexual activity: Not on file   Tobacco Counseling Pt does not smoker Drinks occasionally about once every 1-2 mths  Immunizations and Health Maintenance Immunization History  Administered Date(s) Administered   PFIZER(Purple Top)SARS-COV-2 Vaccination 08/09/2019, 08/30/2019   PNEUMOCOCCAL  CONJUGATE-20 01/12/2021   Pneumococcal Polysaccharide-23 10/30/2019   Tdap 08/30/2017   Health Maintenance Due  Topic Date Due   Zoster Vaccines- Shingrix (1 of 2) Never done   COVID-19 Vaccine (3 - Pfizer risk series) 09/27/2019   FOOT EXAM  01/19/2021    Activities of Daily Living In your present state of health, do you have any difficulty performing the following activities: 08/03/2021  Hearing? N  Vision? N  Difficulty concentrating or making decisions? N  Walking or climbing stairs? N  Dressing or bathing? N  Doing errands, shopping? N  Preparing Food and eating ? N  Using the Toilet? N  In the past six months, have you accidently leaked urine? N  Do you have problems with loss of bowel control? N  Managing your Medications? N  Managing your  Finances? N  Housekeeping or managing your Housekeeping? N  Some recent data might be hidden    Physical Exam  (optional), or other factors deemed appropriate based on the beneficiary's medical and social history and current clinical standards. Gen: Older African-American male in NAD. Mouth: Oral mucosa is moist. Neck: No cervical lymphadenopathy.  No thyroid enlargement. Chest: Clear to auscultation bilaterally CVS: Regular rate rhythm no gallops or murmurs appreciated. Ext: Trace lower extremity edema  Advanced Directives:  Pt has a HPOA - his daughter Kamau Weatherall and also thinks he has a living will.  Agrees to bring copy to put in is records    Assessment:    This is a routine wellness  examination for this patient .   Vision/Hearing screen Hearing Screening   1000Hz  2000Hz  4000Hz   Right ear Pass Pass Fail  Left ear Pass Pass Fail   Vision Screening   Right eye Left eye Both eyes  Without correction     With correction 20/25 20/40 20/13   Has retinopathy and currently gets inj in both eyes for the last 3 mths.   Injections done Q 5 wks - [facility] in [place]  Dietary issues and exercise activities  discussed:    Reports he tries to avoid things that would elev blood sugars. Avoids sugary drinks.  Eating fruits and veggies Walks every morning for 20 mins and sometimes rides bike for 30 mins   Goals      Eat Healthy     Timeframe:  Long-Range Goal Priority:  High Start Date:    08/03/21                         Expected End Date:    04/2022                   Follow Up Date 08/25/2021   Goal is to maintain healthy eating habits     Why is this important?   When you are ready to manage your nutrition or weight, having a plan and setting goals will help.  Taking small steps to change how you eat and exercise is a good place to start.    Notes:         Depression Screen PHQ 2/9 Scores 08/03/2021 08/03/2021 04/16/2021 02/26/2021  PHQ - 2 Score 3 0 0 4  PHQ- 9 Score 5 - 0 20  Reports depression is not a major issue for him  Fall Risk Fall Risk  08/03/2021  Falls in the past year? 0  Number falls in past yr: 0  Injury with Fall? 0  Risk for fall due to : No Fall Risks    Cognitive Function MMSE - Mini Mental State Exam 08/03/2021  Orientation to time 5  Orientation to Place 5  Registration 3  Attention/ Calculation 5  Recall 3  Language- name 2 objects 2  Language- repeat 1  Language- follow 3 step command 3  Language- read & follow direction 1  Write a sentence 1  Copy design 0  Total score 29     6CIT Screen 08/03/2021  What Year? 0 points  What month? 0 points  What time? 0 points  Count back from 20 0 points  Months in reverse 0 points  Repeat phrase 0 points  Total Score 0    Patient Care Team: Ladell Pier, MD as PCP - General (Internal Medicine)     Plan:   1.  Encounter for Medicare annual wellness exam Patient reports having a living will and healthcare power of attorney.  I requested that he bring a copy for our records on next visit and he is agreeable to doing that. Encouraged him to continue healthy eating habits and regular  exercise.  2. Type 2 diabetes mellitus with diabetic polyneuropathy, without long-term current use of insulin (HCC) A1c is at goal.  I told patient that he can discontinue the glimepiride and continue the [place].  Request that he brings all medicines with him to each visit. - POCT glycosylated hemoglobin (Hb A1C)  3. Resistant hypertension Blood pressure very high today.  I have requested that he follow-up with me in about 3 weeks for medication reconciliation and for blood pressure recheck.  Encouraged him to check blood pressure at least once or twice a week and record the readings and bring it with him on that visit.  4. Need for shingles vaccine Patient had agreed to receive Shingrix vaccine but we forgot to give it today.  We will give it on his next visit.   I have personally reviewed and noted the following in the patients chart:   Medical and social history Use of alcohol, tobacco or illicit drugs  Current medications and supplements Functional ability and status Nutritional status Physical activity Advanced directives List of other physicians Hospitalizations, surgeries, and ER visits in previous 12 months Vitals Screenings to include cognitive, depression, and falls Referrals and appointments  In addition, I have reviewed and discussed with patient certain preventive protocols, quality metrics, and best practice recommendations. A written personalized care plan for preventive services as well as general preventive health recommendations were provided to patient.    Karle Plumber, MD 08/03/2021

## 2021-08-08 ENCOUNTER — Ambulatory Visit (HOSPITAL_BASED_OUTPATIENT_CLINIC_OR_DEPARTMENT_OTHER)
Admission: RE | Admit: 2021-08-08 | Discharge: 2021-08-08 | Disposition: A | Discharge status: Home / Self Care | Payer: Medicare Other | Source: Ambulatory Visit | Attending: Nephrology | Admitting: Nephrology

## 2021-08-08 ENCOUNTER — Other Ambulatory Visit: Payer: Self-pay

## 2021-08-08 DIAGNOSIS — I10 Essential (primary) hypertension: Secondary | ICD-10-CM | POA: Diagnosis not present

## 2021-08-08 DIAGNOSIS — R809 Proteinuria, unspecified: Secondary | ICD-10-CM | POA: Diagnosis not present

## 2021-08-08 DIAGNOSIS — N1832 Chronic kidney disease, stage 3b: Secondary | ICD-10-CM | POA: Insufficient documentation

## 2021-08-08 DIAGNOSIS — N189 Chronic kidney disease, unspecified: Secondary | ICD-10-CM | POA: Diagnosis not present

## 2021-08-13 ENCOUNTER — Ambulatory Visit (INDEPENDENT_AMBULATORY_CARE_PROVIDER_SITE_OTHER): Payer: Medicare Other

## 2021-08-13 DIAGNOSIS — I639 Cerebral infarction, unspecified: Secondary | ICD-10-CM

## 2021-08-13 LAB — CUP PACEART REMOTE DEVICE CHECK
Date Time Interrogation Session: 20230319230827
Implantable Pulse Generator Implant Date: 20220429

## 2021-08-15 DIAGNOSIS — E113312 Type 2 diabetes mellitus with moderate nonproliferative diabetic retinopathy with macular edema, left eye: Secondary | ICD-10-CM | POA: Diagnosis not present

## 2021-08-21 ENCOUNTER — Telehealth: Payer: Self-pay | Admitting: Pharmacist

## 2021-08-21 ENCOUNTER — Ambulatory Visit: Payer: Self-pay | Admitting: *Deleted

## 2021-08-21 NOTE — Patient Outreach (Signed)
Patient called me back. He asked again about why the pharmacy filled hydralazine 100 mg daily.  ? ? ?Current medications: prescribed losartan 100 mg daily, carvedilol 6.25 mg twice daily, hydralazine 100 mg three times daily (recent increased by Dr. Candiss Briggs), and chlorthalidone 25 mg daily.  ? ?Patient admits he has not been taking any medications consistently at home. Fill history supports this.  ? ?Patient has an automated upper arm home BP machine. ? ?Current blood pressure readings: home reading of 134/76 today after taking 1 dose of hydralazine 100 mg  ?  ?Reports dizziness, nausea.  ? ?Medication Fill History (last 6 months)  ?- Atorvastatin 40 mg - 30 day supply filled 06/28/21 ?- Carvedilol 6 25 mg - 30 day supply filled 05/09/21 ?- Hydralazine 100 mg - 90 day supply filled 08/11/21 ? ?Medications Reviewed Today   ? ? Reviewed by Douglas Briggs, RPH-CPP (Pharmacist) on 05/01/21 at [place] List Status: <None>  ? ?Medication Order Taking? Sig Documenting Provider Last Dose Status Informant  ?aspirin EC 81 MG tablet [id number] No Take 81 mg by mouth daily. Swallow whole. [provider] Taking Active   ?atorvastatin (LIPITOR) 40 MG tablet [id number]  Take 2 tablets (80 mg total) by mouth daily in the afternoon. Douglas Pier, MD  Active   ?carvedilol (COREG) 3.125 MG tablet [id number] No Take 1 tablet (3.125 mg total) by mouth 2 (two) times daily with a meal. Douglas Pier, MD Taking Active   ?chlorthalidone (HYGROTON) 25 MG tablet [id number] No Take 1 tablet (25 mg total) by mouth daily. RevankarReita Cliche, MD Taking Expired 01/24/21 2359   ?dapagliflozin propanediol (FARXIGA) 5 MG TABS tablet [id number] No Take 1 tablet (5 mg total) by mouth daily before breakfast. Stop Metformin Douglas Pier, MD Taking Active   ?ezetimibe (ZETIA) 10 MG tablet [id number] No Take 10 mg by mouth daily. [provider] Taking Active   ?famotidine (PEPCID) 20 MG tablet [id number] No Take 20 mg by  mouth 2 (two) times daily as needed for heartburn or indigestion. [provider] Taking Active   ?glimepiride (AMARYL) 1 MG tablet [id number] No Take 1 tablet (1 mg total) by mouth daily with breakfast. Douglas Pier, MD Taking Active   ?hydrALAZINE (APRESOLINE) 50 MG tablet [id number] No Take 1 tablet (50 mg total) by mouth 3 (three) times daily. STOP Clonidine Douglas Pier, MD Taking Active   ?ketorolac (ACULAR) 0.5 % ophthalmic solution [id number] No SMARTSIG:In Eye(s) [provider] Taking Active   ?losartan (COZAAR) 100 MG tablet [id number] No Take 1 tablet (100 mg total) by mouth daily. Douglas Pier, MD Taking Active   ?Multiple Vitamin (MULTIVITAMIN WITH MINERALS) TABS tablet [id number] No Take 1 tablet by mouth daily with breakfast. [provider] Taking Active Child  ?ondansetron (ZOFRAN ODT) 4 MG disintegrating tablet [id number] No Take 1 tablet (4 mg total) by mouth every 8 (eight) hours as needed for nausea or vomiting. Douglas Herter, PA-C Taking Active   ?prednisoLONE acetate (PRED FORTE) 1 % ophthalmic suspension [id number] No SMARTSIG:In Eye(s) [provider] Taking Active   ?VITAMIN D PO [id number] No Take 1 tablet by mouth daily with breakfast.  [provider] Taking Active Child  ? ?  ?  ? ?  ? ? ? ?Assessment/Plan: ?- Currently uncontrolled, related to medication non-adherence. Suspect taking one dose of hydralazine caused rapid BP improvement which resulted in symptoms. ?- Reviewed with patient that providers continue to add/increase  medications if he is not forthcoming with what he is taking at home (and when he is not taking anything).  ?- Recommend re-initiation of losartan + low dose carvedilol (hx CAD/PAD) with BMP next week to evaluate creatinine and potassium. Subsequent re-initiation/titration of other agents as needed.  ? ?- Recommend sending refill on atorvastatin 40 mg to promote adherence. Follow next lipid panel to  determine need to increase dose to target LDL <70.  ?- Would be reticent to add Farxiga at the same time as re-initiation of ARB to avoid dual impact on Scr. Consider initiation ~ 1 month after patient is re-established on ARB therapy.  ? ?Routing to PCP and embedded pharmacist to address.  ? ?Douglas Briggs, PharmD, BCACP ?[place] ?[phone] ? ? ? ?

## 2021-08-21 NOTE — Telephone Encounter (Signed)
Patient is aware of appointment with PCP to discuss an alternative on 08/28/21. Patient reports BP today being 134/74. Patient advised to hold medication but to check BP daily and bring the results to PCP FU. ?

## 2021-08-21 NOTE — Telephone Encounter (Deleted)
Answer Assessment - Initial Assessment Questions ?1. NAME of MEDICATION: "What medicine are you calling about?" ?    Hydralazine and Catapres ?2. QUESTION: "What is your question?" (e.g., double dose of medicine, side effect) ?    Picked up Hydralazine and it is 100mg , it has always been 50mg  ?3. PRESCRIBING HCP: "Who prescribed it?" Reason: if prescribed by specialist, call should be referred to that group. ?    Dr. Wynetta Emery (50mg  on med list.) ?4. SYMPTOMS: "Do you have any symptoms?" ?    Took it twice yesterday and it was too strong ?5. SEVERITY: If symptoms are present, ask "Are they mild, moderate or severe?" moderate ? ?Protocols used: Medication Question Call-A-AH ? ?

## 2021-08-21 NOTE — Patient Outreach (Signed)
Patient appearing on report for [place] [facility] Hypertension Control due to last documented ambulatory blood pressure of 189/91 on 08/03/2021. Next appointment with PCP is 08/28/21  ? ?Outreached patient to discuss hypertension control and medication management. However, also noted that he called the office today to discuss nausea and dizziness that he attributes to hydralazine.  ? ?When I called him, he was not home with his pill bottles. We agreed for me to call back at 5 pm to review what he is taking at home. Reports a BP readings today of 137/74.  ? ?Fill history indicates a recent fill of hydralazine 100 mg per Dr. Gean Quint (08/11/21 for 90 day supply), with a last fill date of 05/09/21 for a 30 day supply of carvedilol 6.25 mg, no recent fills for lisinopril or chlorthalidone.  ? ?Catie Darnelle Maffucci, PharmD, BCACP ?[place] ?[phone] ? ?

## 2021-08-21 NOTE — Telephone Encounter (Signed)
Medication SE- see notes- requesting alternative ?

## 2021-08-21 NOTE — Telephone Encounter (Addendum)
Reason for Disposition ? [1] Caller has medicine question about med NOT prescribed by PCP AND [2] triager unable to answer question (e.g., compatibility with other med, storage) ? ?Protocols used: Medication Question Call-A-AH ? ?Chief Complaint: pt says Hydralazine he picked up is 100 mg instead of 50mg  ?Symptoms: too strong ?Frequency: 3x day, he only takes 2 times a day ?Pertinent Negatives:na ?Disposition: [] ED /[] Urgent Care (no appt availability in office) / [] Appointment(In office/virtual)/ []  [place] [facility]/ [] Home Care/ [] Refused Recommended Disposition /[] [facility]/ [x]  Follow-up with PCP ?Additional Notes:  While I was talking with pt  pharmacist Alphonzo Severance [facility] called him. He hung up with me. Pharmacist states she has it straight and pt does not need to be called back. ? ?Answer Assessment - Initial Assessment Questions ?1. NAME of MEDICATION: "What medicine are you calling about?" ?    Hydralazine and Catapres ?2. QUESTION: "What is your question?" (e.g., double dose of medicine, side effect) ?    Picked up Hydralazine and it is 100mg , it has always been 50mg  ?3. PRESCRIBING HCP: "Who prescribed it?" Reason: if prescribed by specialist, call should be referred to that group. ?    Dr. Wynetta Emery (50mg  on med list.) ?4. SYMPTOMS: "Do you have any symptoms?" ?    Took it twice yesterday and it was too strong ?5. SEVERITY: If symptoms are present, ask "Are they mild, moderate or severe?" moderate ? ?Protocols used: Medication Question Call-A-AH ? ?

## 2021-08-21 NOTE — Telephone Encounter (Signed)
Summary: Dizziness + Nausea bc of medicine  ? Dizziness, very nauseated because of Hydralazine  ? ?Best contact: [phone]   ?  ? ?Reason for Disposition ? [1] Caller has URGENT medicine question about med that PCP or specialist prescribed AND [2] triager unable to answer question ? ?Answer Assessment - Initial Assessment Questions ?1. NAME of MEDICATION: "What medicine are you calling about?" ?    Hydralazine  ?2. QUESTION: "What is your question?" (e.g., double dose of medicine, side effect) ?    Started medication when prescribed- and it made patient dizzy- he stopped the medication. Patient then tried it again- caused dizziness/nausea ?3. PRESCRIBING HCP: "Who prescribed it?" Reason: if prescribed by specialist, call should be referred to that group. ?    PCP ?4. SYMPTOMS: "Do you have any symptoms?" ?    Dizziness/ nausea- due to medication, last dose was this morning- patient is supposed to take 3 pills/day- but is not taking anymore. Patient is taking for high BP/kidney disease ? ?Protocols used: Medication Question Call-A-AH ? ?

## 2021-08-21 NOTE — Progress Notes (Signed)
Carelink Summary Report / Loop Recorder 

## 2021-08-22 ENCOUNTER — Other Ambulatory Visit: Payer: Self-pay | Admitting: Pharmacist

## 2021-08-22 MED ORDER — ATORVASTATIN CALCIUM 40 MG PO TABS: 80.0000 mg | ORAL_TABLET | Freq: Every day | ORAL | 1 refills | Status: DC

## 2021-08-28 ENCOUNTER — Ambulatory Visit: Payer: Medicaid Other | Admitting: Internal Medicine

## 2021-08-30 ENCOUNTER — Encounter: Payer: Self-pay | Admitting: Internal Medicine

## 2021-08-30 ENCOUNTER — Ambulatory Visit: Payer: Medicare Other | Attending: Internal Medicine | Admitting: Internal Medicine

## 2021-08-30 DIAGNOSIS — N1832 Chronic kidney disease, stage 3b: Secondary | ICD-10-CM | POA: Diagnosis not present

## 2021-08-30 DIAGNOSIS — I10 Essential (primary) hypertension: Secondary | ICD-10-CM | POA: Diagnosis not present

## 2021-08-30 DIAGNOSIS — E11319 Type 2 diabetes mellitus with unspecified diabetic retinopathy without macular edema: Secondary | ICD-10-CM

## 2021-08-30 MED ORDER — CHLORTHALIDONE 25 MG PO TABS: 25.0000 mg | ORAL_TABLET | Freq: Every day | ORAL | 3 refills | Status: DC

## 2021-08-30 MED ORDER — LOSARTAN POTASSIUM 100 MG PO TABS: 100.0000 mg | ORAL_TABLET | Freq: Every day | ORAL | 6 refills | Status: DC

## 2021-08-30 MED ORDER — CARVEDILOL 6.25 MG PO TABS: 6.2500 mg | ORAL_TABLET | Freq: Two times a day (BID) | ORAL | 2 refills | Status: DC

## 2021-08-30 MED ORDER — LOSARTAN POTASSIUM 50 MG PO TABS: 50.0000 mg | ORAL_TABLET | Freq: Every day | ORAL | 6 refills | Status: DC

## 2021-08-30 MED ORDER — DAPAGLIFLOZIN PROPANEDIOL 5 MG PO TABS: 5.0000 mg | ORAL_TABLET | Freq: Every day | ORAL | 3 refills | Status: DC

## 2021-08-30 MED ORDER — HYDRALAZINE HCL 50 MG PO TABS: 50.0000 mg | ORAL_TABLET | Freq: Three times a day (TID) | ORAL | 6 refills | Status: DC

## 2021-08-30 NOTE — Progress Notes (Signed)
? ? ?Patient ID: Douglas Briggs, male    DOB: 1961-04-28  MRN: [id number] ? ?CC: Blood Pressure Check ? ? ?Subjective: ?Douglas Briggs is a 61 y.o. 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 ?Presents for f/u BP check. ?Has meds with him today. ?He should be on hydralazine 100 mg 3 times a day ?Cozaar 100 mg daily ?Carvedilol 6.25 mg twice a day ?Chlorthalidone 25 mg daily ? ?-He reports Hydralazine increased from 50 mg TID  to 100 mg TID by his nephrologist Dr. Candiss Norse whom he saw  08/03/2021.  Has bottle of 100 mg tabs and also old bottle of 25 mg tabs with him today.  He tells me he cuts the 100 mg tablets into 4 quarters and takes 1/4 of the tablet 3 times a day.  He tells me that the 100 mg tablets cause headaches and nausea. ?-Rxn for Cozaar on med list is 100 mg daily.  He has a bottle today that was filled 03/28/2020 for Cozaar 25 mg to take  2 tabs daily ?-Coreg: He should be on 6.25 mg twice a day.  Has bottle for 3.125 mg  and 6.25 mg.  He tells me he was taking both of these twice a day. ? ?Checks his blood pressure at home daily.  Reports blood pressure this morning at home was 158/75.  ? ?DM: On last visit he told me that he was not taking the [place] or Amaryl every day and that he was trying to control his diabetes with healthy eating and exercise.  His A1c was 5.8.  Advised that he discontinue the Amaryl and continue the [place].  ?-Today, he has a bottle of glimepiride.  Wilder Glade is not present with any of the medicines that he presented with today. ? ? ?Patient Active Problem List  ? Diagnosis Date Noted  ? Stroke [facility])   ? Hypertension   ? Neuropathy   ? Protein-calorie  malnutrition, mild ([place]) 09/11/2020  ? Hyperlipidemia associated with type 2 diabetes mellitus ([place]) 09/11/2020  ? Diabetes mellitus due to underlying condition with retinopathy of both eyes ([place])   ? Hypertension associated with diabetes ([place])   ? NPDR (nonproliferative diabetic retinopathy) ([place]) 02/09/2020  ? Influenza vaccination declined 01/20/2020  ? Cellulitis 12/19/2019  ? Dry gangrene ([place]) 12/19/2019  ? HLD (hyperlipidemia)   ? Normocytic anemia   ? CKD (chronic kidney disease)   ? Essential hypertension 11/22/2019  ? Atherosclerotic vascular disease 11/22/2019  ? Acute CVA (cerebrovascular accident) ([place]) 10/30/2019  ? TIA (transient ischemic attack) 10/29/2019  ? Stroke-like symptoms 10/29/2019  ? Right sided numbness   ? Acquired hammer toe of left foot 03/12/2017  ? Tinea pedis of both feet 03/12/2017  ? Male hypogonadism 11/14/2016  ? Restless leg syndrome 11/14/2016  ? Uncontrolled type 2 diabetes mellitus with diabetic polyneuropathy, without long-term current use of insulin 11/12/2016  ?  ? ?Current Outpatient Medications on File Prior to Visit  ?Medication Sig Dispense Refill  ? aspirin EC 81 MG tablet Take 81 mg by mouth daily. Swallow whole.    ? atorvastatin (LIPITOR) 40 MG tablet Take 2 tablets (80 mg total) by mouth daily in  the afternoon. 180 tablet 1  ? carvedilol (COREG) 6.25 MG tablet Take 1 tablet (6.25 mg total) by mouth 2 (two) times daily with a meal. (Patient not taking: Reported on 08/21/2021) 60 tablet 2  ? chlorthalidone (HYGROTON) 25 MG tablet Take 1 tablet (25 mg total) by mouth daily. (Patient not taking: Reported on 08/21/2021) 90 tablet 3  ? dapagliflozin propanediol (FARXIGA) 5 MG TABS tablet Take 1 tablet (5 mg total) by mouth daily before breakfast. Stop Metformin (Patient not taking: Reported on 08/21/2021) 30 tablet 3  ? ezetimibe (ZETIA) 10 MG tablet Take 10 mg by mouth daily.    ? famotidine (PEPCID) 20 MG tablet Take 20 mg by mouth 2 (two) times daily as needed for  heartburn or indigestion.    ? hydrALAZINE (APRESOLINE) 50 MG tablet Take 1 tablet (50 mg total) by mouth 3 (three) times daily. STOP Clonidine (Patient not taking: Reported on 08/21/2021) 90 tablet 6  ? ketorolac (ACULAR) 0.5 % ophthalmic solution SMARTSIG:In Eye(s)    ? losartan (COZAAR) 100 MG tablet Take 1 tablet (100 mg total) by mouth daily. (Patient not taking: Reported on 08/21/2021) 30 tablet 6  ? Multiple Vitamin (MULTIVITAMIN WITH MINERALS) TABS tablet Take 1 tablet by mouth daily with breakfast. (Patient not taking: Reported on 08/21/2021)    ? ondansetron (ZOFRAN ODT) 4 MG disintegrating tablet Take 1 tablet (4 mg total) by mouth every 8 (eight) hours as needed for nausea or vomiting. (Patient not taking: Reported on 08/21/2021) 20 tablet 0  ? prednisoLONE acetate (PRED FORTE) 1 % ophthalmic suspension SMARTSIG:In Eye(s) (Patient not taking: Reported on 08/21/2021)    ? VITAMIN D PO Take 1 tablet by mouth daily with breakfast.  (Patient not taking: Reported on 08/21/2021)    ? ?No current facility-administered medications on file prior to visit.  ? ? ?No Known Allergies ? ?Social History  ? ?Socioeconomic History  ? Marital status: Single  ?  Spouse name: Not on file  ? Number of children: Not on file  ? Years of education: Not on file  ? Highest education level: Not on file  ?Occupational History  ? Not on file  ?Tobacco Use  ? Smoking status: Never  ? Smokeless tobacco: Never  ?Vaping Use  ? Vaping Use: Never used  ?Substance and Sexual Activity  ? Alcohol use: No  ? Drug use: Never  ? Sexual activity: Not on file  ?Other Topics Concern  ? Not on file  ?Social History Narrative  ? Not on file  ? ?Social Determinants of Health  ? ?Financial Resource Strain: Not on file  ?Food Insecurity: Not on file  ?Transportation Needs: Not on file  ?Physical Activity: Not on file  ?Stress: Not on file  ?Social Connections: Not on file  ?Intimate Partner Violence: Not on file  ? ? ?Family History  ?Problem Relation Age of  Onset  ? Hypertension Father   ? Cancer Father   ? Diabetes Paternal Aunt   ? Heart disease Neg Hx   ? ? ?Past Surgical History:  ?Procedure Laterality Date  ? AMPUTATION Left 12/20/2019  ? Procedure: AMPUTATION 5TH TOE;  Surgeon: Erle Crocker, MD;  Location: [place];  Service: Orthopedics;  Laterality: Left;  ? BUBBLE STUDY  09/22/2020  ? Procedure: BUBBLE STUDY;  Surgeon: Donato Heinz, MD;  Location: [place];  Service: Cardiovascular;;  ? CATARACT EXTRACTION Bilateral 02/2021  ? LOOP RECORDER INSERTION N/A 09/22/2020  ? Procedure: LOOP RECORDER INSERTION;  Surgeon:  Constance Haw, MD;  Location: [place] [facility];  Service: Cardiovascular;  Laterality: N/A;  ? TEE WITHOUT CARDIOVERSION N/A 09/22/2020  ? Procedure: TRANSESOPHAGEAL ECHOCARDIOGRAM (TEE);  Surgeon: Donato Heinz, MD;  [facility] ENDOSCOPY;  Service: Cardiovascular;  Laterality: N/A;  LOOP  ? ? ?ROS: ?Review of Systems ?Negative except as stated above ? ?PHYSICAL EXAM: ?BP (!) 189/85 (BP Location: Left Arm, Patient Position: Sitting, Cuff Size: Small)   Pulse 60   Resp 16   Wt 156 lb (70.8 kg)   SpO2 98%   BMI 23.72 kg/m?   ?Physical Exam ?BP 214/93 ?General appearance - alert, well appearing, and in no distress ?Mental status - normal mood, behavior, speech, dress, motor activity, and thought processes ?Chest - clear to auscultation, no wheezes, rales or rhonchi, symmetric air entry ?Heart - normal rate, regular rhythm, normal S1, S2, no murmurs, rubs, clicks or gallops ? ? ? ?  Latest Ref Rng & Units 04/16/2021  ?  4:14 PM 12/12/2020  ? 11:14 AM 10/31/2020  ?  3:51 PM  ?CMP  ?Glucose 70 - 99 mg/dL 76   66   89    ?BUN 8 - 27 mg/dL 26   31   24     ?Creatinine 0.76 - 1.27 mg/dL 2.04   1.93   2.02    ?Sodium 134 - 144 mmol/L 140   140   142    ?Potassium 3.5 - 5.2 mmol/L 4.9   5.1   4.9    ?Chloride 96 - 106 mmol/L 111   110   112    ?CO2 20 - 29 mmol/L 21   18   21     ?Calcium 8.6 - 10.2 mg/dL 8.4   8.8   8.6     ?Total Protein 6.0 - 8.5 g/dL 5.6      ?Total Bilirubin 0.0 - 1.2 mg/dL <0.2      ?Alkaline Phos 44 - 121 IU/L 125      ?AST 0 - 40 IU/L 14      ?ALT 0 - 44 IU/L 17      ? ?Lipid Panel  ?   ?Component Value Date/Tim

## 2021-09-06 ENCOUNTER — Telehealth: Payer: Self-pay | Admitting: Pharmacist

## 2021-09-06 NOTE — Patient Outreach (Signed)
Patient appearing on report for [place] [facility] - Hypertension Control report due to last documented ambulatory blood pressure of 189/85 on 08/30/2021. Next appointment with PCP is 10/01/21, scheduled to see embedded pharmacist on 09/18/21  ? ?Outreached patient to discuss hypertension control and medication management. He notes he is not home right now to review his pill bottles, but will be tomorrow morning. Will call back for medication review at that time.  ? ?Catie Hedwig Morton, PharmD, BCACP ?[place] ?[phone] ? ? ?

## 2021-09-07 NOTE — Patient Outreach (Signed)
Called patient for medication reviewed at 11 am as discussed yesterday. Left voicemail for him to return my call at his convenience.  ? ?Catie Hedwig Morton, PharmD, BCACP ?[place] ?[phone] ? ?

## 2021-09-10 NOTE — Patient Outreach (Signed)
Left voicemail for patient to return my call at his convenience.  ? ?Upcoming appointment with cardiology on 4/25 and PharmD on 4/25, PCP on 5/8.  ? ?Catie Hedwig Morton, PharmD, BCACP ?[place] ?[phone] ? ?

## 2021-09-17 ENCOUNTER — Ambulatory Visit (INDEPENDENT_AMBULATORY_CARE_PROVIDER_SITE_OTHER): Payer: Medicare Other

## 2021-09-17 DIAGNOSIS — I639 Cerebral infarction, unspecified: Secondary | ICD-10-CM

## 2021-09-17 LAB — CUP PACEART REMOTE DEVICE CHECK
Date Time Interrogation Session: 20230421230412
Implantable Pulse Generator Implant Date: 20220429

## 2021-09-18 ENCOUNTER — Ambulatory Visit: Payer: Self-pay | Admitting: Cardiology

## 2021-09-18 ENCOUNTER — Ambulatory Visit: Payer: Medicare Other | Admitting: Pharmacist

## 2021-09-19 DIAGNOSIS — E113312 Type 2 diabetes mellitus with moderate nonproliferative diabetic retinopathy with macular edema, left eye: Secondary | ICD-10-CM | POA: Diagnosis not present

## 2021-09-21 ENCOUNTER — Ambulatory Visit: Payer: Medicare Other | Attending: Internal Medicine | Admitting: Pharmacist

## 2021-09-21 VITALS — BP 159/76 | HR 67

## 2021-09-21 DIAGNOSIS — I10 Essential (primary) hypertension: Secondary | ICD-10-CM | POA: Diagnosis not present

## 2021-09-21 MED ORDER — AMLODIPINE BESYLATE 5 MG PO TABS: 5.0000 mg | ORAL_TABLET | Freq: Every day | ORAL | 1 refills | Status: DC

## 2021-09-21 NOTE — Progress Notes (Signed)
? ?S:    ?Patient arrives in good spirits. Presents to the clinic for hypertension evaluation, counseling, and management. Patient was referred and last seen by Primary Care Provider on 08/30/2021. BP was elevated at that visit and he was referred to me for BP check. See blow for medication reconciliation completed by Dr. Wynetta Emery.  ? ?At that visit with Dr. Wynetta Emery on 08/30/2021:  ?- Pt was supposed to be taking hydralazine 100 mg TID but reported taking 25 mg TID. Dr. Wynetta Emery had him increase this to 50 mg TID. ?-Pt was supposed to be taking losartan 100 mg daily but reported taking 50 mg daily. This was continued.  ?-Pt was supposed to be taking carvedilol 6.25 mg BID but it looks like he was taking this + an additional 3.125 mg BID.  ?-Pt was supposed to be taking 25 mg daily and he reported compliance with 25 mg daily.  ? ?Medication adherence reported. Today, he brings his medications with him today and only has those medications that were continued by Dr. Wynetta Emery earlier this month. I commended the patient for this! ? ?Current BP Medications include:  carvedilol 6.25 mg BID, chlorthalidone 25 mg daily, hydralazine 50 mg TID, losartan 50 mg daily  ? ?Dietary habits include: reports compliance with salt restriction but admits to eating things like [place] wings that he prepares himself. He denies intake of caffeine.  ?Exercise habits include: "not like I used to" - works in the yard, walks for a couple hours a week ?Family / Social history:  ?Fhx: HTN, DM ?Tobacco: never smoker  ?Alcohol: denies use  ? ?Home blood pressure:  ?-170s/80s ? ?O:  ?Vitals:  ? 09/21/21 1038  ?BP: (!) 159/76  ?Pulse: 67  ? ? ?Home BP readings: none given  ? ?Last 3 Office BP readings: ?BP Readings from Last 3 Encounters:  ?09/21/21 (!) 159/76  ?08/30/21 (!) 189/85  ?08/21/21 137/74  ? ? ?BMET ?   ?Component Value Date/Time  ? NA 140 04/16/2021 1614  ? K 4.9 04/16/2021 1614  ? CL 111 (H) 04/16/2021 1614  ? CO2 21 04/16/2021 1614  ? GLUCOSE  76 04/16/2021 1614  ? GLUCOSE 157 (H) 02/18/2020 1355  ? BUN 26 04/16/2021 1614  ? CREATININE 2.04 (H) 04/16/2021 1614  ? CALCIUM 8.4 (L) 04/16/2021 1614  ? GFRNONAA 48 (L) 04/14/2020 1543  ? GFRAA 55 (L) 04/14/2020 1543  ? ? ?Renal function: ?CrCl cannot be calculated (Patient's most recent lab result is older than the maximum 21 days allowed.). ? ?Clinical ASCVD: Yes  ?The ASCVD Risk score (Arnett DK, et al., 2019) failed to calculate for the following reasons: ?  The patient has a prior MI or stroke diagnosis ? ? ? ?A/P: ?Hypertension diagnosed currently above goal but better on current medications. BP Goal = < 130/80 mmHg. Medication adherence: see above. He has a history of poor adherence. I reviewed his chart and his amlodipine was stopped in the past d/t LE edema. This appears to have occurred on the 10 mg dose and not the 5 mg dose. Pt agrees with this today. I recommend we add back low dose amlodipine. We will keep his other medications the same so as to limit confusion.  ? ?I praised the patient for his improved adherence since last visit. I think it may help him to see me in regular intervals before seeing Dr. Wynetta Emery. Will plan for him to return in 1 month. I have asked him to bring all medications as  he did today to future appointments. He verbalizes understanding.  ?-Start amlodipine 5 mg daily.  ?-Continue carvedilol 6.25 mg BID, chlorthalidone 25 mg daily, hydralazine 50 mg TID, losartan 50 mg daily  ?-Counseled on lifestyle modifications for blood pressure control including reduced dietary sodium, increased exercise, adequate sleep. ? ?Results reviewed and written information provided.   Total time in face-to-face counseling 30 minutes.   ?F/U Clinic Visit in 1 month.   ? ?Benard Halsted, PharmD, BCACP, CPP ?Clinical Pharmacist ?[place] ?[phone] ? ? ? ? ? ?

## 2021-09-22 LAB — BASIC METABOLIC PANEL
BUN/Creatinine Ratio: 10 (ref 10–24)
BUN: 29 mg/dL — ABNORMAL HIGH (ref 8–27)
CO2: 20 mmol/L (ref 20–29)
Calcium: 8.7 mg/dL (ref 8.6–10.2)
Chloride: 111 mmol/L — ABNORMAL HIGH (ref 96–106)
Creatinine, Ser: 2.94 mg/dL — ABNORMAL HIGH (ref 0.76–1.27)
Glucose: 131 mg/dL — ABNORMAL HIGH (ref 70–99)
Potassium: 5.2 mmol/L (ref 3.5–5.2)
Sodium: 139 mmol/L (ref 134–144)
eGFR: 23 mL/min/{1.73_m2} — ABNORMAL LOW (ref >59)

## 2021-10-01 ENCOUNTER — Ambulatory Visit: Payer: Medicare Other | Attending: Internal Medicine | Admitting: Internal Medicine

## 2021-10-01 ENCOUNTER — Telehealth: Payer: Self-pay | Admitting: *Deleted

## 2021-10-01 DIAGNOSIS — Z91199 Patient's noncompliance with other medical treatment and regimen due to unspecified reason: Secondary | ICD-10-CM

## 2021-10-01 NOTE — Telephone Encounter (Signed)
Calling to get patient ready for call from provider. There was no answer. ?

## 2021-10-01 NOTE — Progress Notes (Signed)
Patient no showed for this virtual visit. ?

## 2021-10-02 NOTE — Progress Notes (Signed)
Carelink Summary Report / Loop Recorder 

## 2021-10-18 ENCOUNTER — Ambulatory Visit (INDEPENDENT_AMBULATORY_CARE_PROVIDER_SITE_OTHER): Payer: Medicare Other

## 2021-10-18 DIAGNOSIS — I639 Cerebral infarction, unspecified: Secondary | ICD-10-CM | POA: Diagnosis not present

## 2021-10-18 LAB — CUP PACEART REMOTE DEVICE CHECK
Date Time Interrogation Session: 20230524231635
Implantable Pulse Generator Implant Date: 20220429

## 2021-10-23 ENCOUNTER — Ambulatory Visit: Payer: Medicaid Other | Admitting: Pharmacist

## 2021-10-24 DIAGNOSIS — E113312 Type 2 diabetes mellitus with moderate nonproliferative diabetic retinopathy with macular edema, left eye: Secondary | ICD-10-CM | POA: Diagnosis not present

## 2021-10-29 NOTE — Progress Notes (Signed)
Carelink Summary Report / Loop Recorder 

## 2021-11-20 ENCOUNTER — Ambulatory Visit (INDEPENDENT_AMBULATORY_CARE_PROVIDER_SITE_OTHER): Payer: Medicare Other

## 2021-11-20 DIAGNOSIS — I639 Cerebral infarction, unspecified: Secondary | ICD-10-CM | POA: Diagnosis not present

## 2021-11-20 LAB — CUP PACEART REMOTE DEVICE CHECK
Date Time Interrogation Session: 20230626230648
Implantable Pulse Generator Implant Date: 20220429

## 2021-11-21 ENCOUNTER — Telehealth: Payer: Self-pay | Admitting: Internal Medicine

## 2021-11-21 NOTE — Telephone Encounter (Signed)
Copied from [place] [id number]. Topic: General - Other >> Nov 21, 2021 10:23 AM Marcellus Scott wrote: Reason for CRM: Shanon Brow w/ [employer] asking if is possible to return forms by the end of the day.  Advised per Gomez Cleverly, CMA 11/20/2021 4:48 PM Fax has been received and will be faxed once complete.   Fax- [phone]

## 2021-11-22 NOTE — Telephone Encounter (Signed)
Paperwork has been faxed to number provided.

## 2021-11-26 NOTE — Telephone Encounter (Signed)
Shanon Brow from [employer] has called to request that the forms be resubmitted via fax to [phone] Attention Shanon Brow

## 2021-11-28 ENCOUNTER — Ambulatory Visit: Payer: Self-pay | Admitting: Cardiology

## 2021-11-28 NOTE — Telephone Encounter (Signed)
Paperwork has been re faxed to number provided.

## 2021-11-29 DIAGNOSIS — E113312 Type 2 diabetes mellitus with moderate nonproliferative diabetic retinopathy with macular edema, left eye: Secondary | ICD-10-CM | POA: Diagnosis not present

## 2021-12-11 NOTE — Progress Notes (Signed)
Carelink Summary Report / Loop Recorder 

## 2021-12-13 ENCOUNTER — Ambulatory Visit: Payer: Medicare Other | Attending: Internal Medicine | Admitting: Internal Medicine

## 2021-12-13 ENCOUNTER — Encounter: Payer: Self-pay | Admitting: Internal Medicine

## 2021-12-13 VITALS — BP 180/90 | HR 66 | Wt 153.0 lb

## 2021-12-13 DIAGNOSIS — E11319 Type 2 diabetes mellitus with unspecified diabetic retinopathy without macular edema: Secondary | ICD-10-CM | POA: Diagnosis not present

## 2021-12-13 DIAGNOSIS — N1832 Chronic kidney disease, stage 3b: Secondary | ICD-10-CM | POA: Diagnosis not present

## 2021-12-13 DIAGNOSIS — I739 Peripheral vascular disease, unspecified: Secondary | ICD-10-CM | POA: Diagnosis not present

## 2021-12-13 DIAGNOSIS — E1169 Type 2 diabetes mellitus with other specified complication: Secondary | ICD-10-CM

## 2021-12-13 DIAGNOSIS — E785 Hyperlipidemia, unspecified: Secondary | ICD-10-CM

## 2021-12-13 DIAGNOSIS — I152 Hypertension secondary to endocrine disorders: Secondary | ICD-10-CM | POA: Diagnosis not present

## 2021-12-13 DIAGNOSIS — E1159 Type 2 diabetes mellitus with other circulatory complications: Secondary | ICD-10-CM | POA: Diagnosis not present

## 2021-12-13 DIAGNOSIS — Z23 Encounter for immunization: Secondary | ICD-10-CM

## 2021-12-13 DIAGNOSIS — E1142 Type 2 diabetes mellitus with diabetic polyneuropathy: Secondary | ICD-10-CM | POA: Diagnosis not present

## 2021-12-13 LAB — GLUCOSE, POCT (MANUAL RESULT ENTRY): POC Glucose: 131 mg/dl — AB (ref 70–99)

## 2021-12-13 LAB — POCT GLYCOSYLATED HEMOGLOBIN (HGB A1C): HbA1c, POC (controlled diabetic range): 5.6 % (ref 0.0–7.0)

## 2021-12-13 NOTE — Progress Notes (Addendum)
Patient ID: Douglas Briggs, male    DOB: [birth year]  MRN: [id number]  CC: Chronic disease management  Subjective: Douglas Briggs is a 61 y.o. male who presents for chronic disease management.   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  HTN:  Did not take meds as yet today He is supposed to be on Norvasc 5 mg daily, carvedilol 6.25 mg twice a day, chlorthalidone 25 mg daily, hydralazine 50 mg TID and Cozaar 50 mg daily. Has Cozaar 100 mg tablets but has marked on the bottle half a tablet daily which is correct.  Has hydralazine 100 mg tablets but has marked on the bottle to take half a tablet 3 times a day.  He has an old bottle of furosemide 20 mg which she is not supposed to be on. checks BP daily.  Yesterday was 148/83 Limits salt No CP/SOB.  LE off and on Last saw Dr. Candiss Norse 07/2021 for follow-up of CKD.  Usually GFR in the 30s.  Last GFR was 23 with creatinine of 2.94.   DM: Results for orders placed or performed in visit on 12/13/21  POCT glucose (manual entry)  Result Value Ref Range   POC Glucose 131 (A) 70 - 99 mg/dl  POCT glycosylated hemoglobin (Hb A1C)  Result Value Ref Range   Hemoglobin A1C     HbA1c POC (<> result, manual entry)     HbA1c, POC (prediabetic range)     HbA1c, POC (controlled diabetic range) 5.6 0.0 - 7.0 %  Checks BS QOD.  Range 130  taking Farxiga and has the bottle with him.  Reports he is doing well with his eating habits.  He walks almost daily. HL:  cramps in fingers occasionally.  Reports compliance with taking atorvastatin 40 mg daily and Zetia.  He has these bottles with him today.  PAD:  no cramps in legs.    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   HLD (hyperlipidemia)    Normocytic anemia    CKD (chronic kidney disease)    Essential hypertension 11/22/2019   Atherosclerotic vascular disease 11/22/2019   Acute CVA (cerebrovascular accident) ([place]) 10/30/2019   TIA (transient ischemic attack) 10/29/2019   Stroke-like symptoms 10/29/2019   Right sided numbness    Acquired hammer toe of left foot 03/12/2017   Tinea pedis of both feet 03/12/2017   Male hypogonadism 11/14/2016   Restless leg syndrome 11/14/2016   Uncontrolled type 2 diabetes mellitus with diabetic polyneuropathy, without long-term current use of insulin 11/12/2016     Current Outpatient Medications on File Prior to Visit  Medication Sig Dispense Refill   amLODipine (NORVASC) 5  MG tablet Take 1 tablet (5 mg total) by mouth daily. 90 tablet 1   aspirin EC 81 MG tablet Take 81 mg by mouth daily. Swallow whole.     atorvastatin (LIPITOR) 40 MG tablet Take 2 tablets (80 mg total) by mouth daily in the afternoon. 180 tablet 1   carvedilol (COREG) 6.25 MG tablet Take 1 tablet (6.25 mg total) by mouth 2 (two) times daily with a meal. 60 tablet 2   chlorthalidone (HYGROTON) 25 MG tablet Take 1 tablet (25 mg total) by mouth daily. 90 tablet 3   dapagliflozin propanediol (FARXIGA) 5 MG TABS tablet Take 1 tablet (5 mg total) by mouth daily before breakfast. Stop Metformin 30 tablet 3   ezetimibe (ZETIA) 10 MG tablet  Take 10 mg by mouth daily.     famotidine (PEPCID) 20 MG tablet Take 20 mg by mouth 2 (two) times daily as needed for heartburn or indigestion.     hydrALAZINE (APRESOLINE) 50 MG tablet Take 1 tablet (50 mg total) by mouth 3 (three) times daily. STOP Clonidine 90 tablet 6   ketorolac (ACULAR) 0.5 % ophthalmic solution SMARTSIG:In Eye(s)     losartan (COZAAR) 50 MG tablet Take 1 tablet (50 mg total) by mouth daily. 30 tablet 6   prednisoLONE acetate (PRED FORTE) 1 % ophthalmic suspension      No current facility-administered medications on file prior to visit.    No Known Allergies  Social History   Socioeconomic History   Marital status: Single    Spouse name: Not on file   Number of children: Not on file   Years of education: Not on file   Highest education level: Not on file  Occupational History   Not on file  Tobacco Use   Smoking status: Never   Smokeless tobacco: Never  Vaping Use   Vaping Use: Never used  Substance and Sexual Activity   Alcohol use: No   Drug use: Never   Sexual activity: Not on file  Other Topics Concern   Not on file  Social History Narrative   Not on file   Social Determinants of Health   Financial Resource Strain: Not on file  Food Insecurity: Not on file  Transportation Needs: Not on file  Physical Activity: Not on file  Stress: Not on file  Social Connections: Not on file  Intimate Partner Violence: Not on file    Family History  Problem Relation Age of Onset   Hypertension Father    Cancer Father    Diabetes Paternal Aunt    Heart disease Neg Hx     Past Surgical History:  Procedure Laterality Date   AMPUTATION Left 12/20/2019   Procedure: AMPUTATION 5TH TOE;  Surgeon: Erle Crocker, MD;  Location: [facility];  Service: Orthopedics;  Laterality: Left;   BUBBLE STUDY  09/22/2020   Procedure: BUBBLE STUDY;  Surgeon: Donato Heinz, MD;  Location: [facility];  Service: Cardiovascular;;   CATARACT EXTRACTION  Bilateral 02/2021   LOOP RECORDER INSERTION N/A 09/22/2020   Procedure: LOOP RECORDER INSERTION;  Surgeon: Constance Haw, MD;  Location: [facility];  Service: Cardiovascular;  Laterality: N/A;   TEE WITHOUT CARDIOVERSION N/A 09/22/2020   Procedure: TRANSESOPHAGEAL ECHOCARDIOGRAM (TEE);  Surgeon: Donato Heinz, MD;  Location[facility];  Service: Cardiovascular;  Laterality: N/A;  LOOP    ROS: Review of Systems Negative except as stated above  PHYSICAL EXAM: BP (!) 186/86   Pulse 66   Wt  153 lb (69.4 kg)   SpO2 99%   BMI 23.26 kg/m   Physical Exam BP 180/90 General appearance - alert, well appearing, and in no distress Mental status - normal mood, behavior, speech, dress, motor activity, and thought processes Neck - supple, no significant adenopathy Chest - clear to auscultation, no wheezes, rales or rhonchi, symmetric air entry Heart - normal rate, regular rhythm, normal S1, S2, no murmurs, rubs, clicks or gallops Extremities - peripheral pulses normal, no pedal edema, no clubbing or cyanosis Diabetic Foot Exam - Simple   Simple Foot Form Diabetic Foot exam was performed with the following findings: Yes 12/13/2021  2:08 PM  Visual Inspection See comments: Yes Sensation Testing Intact to touch and monofilament testing bilaterally: Yes Pulse Check Posterior Tibialis and Dorsalis pulse intact bilaterally: Yes Comments Flat footed.  Toe nails thick and discolored especially RT big toe         Latest Ref Rng & Units 09/21/2021   10:51 AM 04/16/2021    4:14 PM 12/12/2020   11:14 AM  CMP  Glucose 70 - 99 mg/dL 131  76  66   BUN 8 - 27 mg/dL 29  26  31    Creatinine 0.76 - 1.27 mg/dL 2.94  2.04  1.93   Sodium 134 - 144 mmol/L 139  140  140   Potassium 3.5 - 5.2 mmol/L 5.2  4.9  5.1   Chloride 96 - 106 mmol/L 111  111  110   CO2 20 - 29 mmol/L 20  21  18    Calcium 8.6 - 10.2 mg/dL 8.7  8.4  8.8   Total Protein 6.0 - 8.5 g/dL  5.6    Total  Bilirubin 0.0 - 1.2 mg/dL  <0.2    Alkaline Phos 44 - 121 IU/L  125    AST 0 - 40 IU/L  14    ALT 0 - 44 IU/L  17     Lipid Panel     Component Value Date/Time   CHOL 352 (H) 04/16/2021 1614   TRIG 197 (H) 04/16/2021 1614   HDL 66 04/16/2021 1614   CHOLHDL 5.3 (H) 04/16/2021 1614   CHOLHDL 4.4 12/21/2019 0123   VLDL 23 12/21/2019 0123   LDLCALC 247 (H) 04/16/2021 1614    CBC    Component Value Date/Time   WBC 4.2 09/06/2020 1327   WBC 5.6 02/18/2020 1355   RBC 4.08 (L) 09/06/2020 1327   RBC 3.99 (L) 02/18/2020 1355   HGB 11.1 (L) 09/06/2020 1327   HCT 34.6 (L) 09/06/2020 1327   PLT 164 09/06/2020 1327   MCV 85 09/06/2020 1327   MCH 27.2 09/06/2020 1327   MCH 27.3 02/18/2020 1355   MCHC 32.1 09/06/2020 1327   MCHC 33.2 02/18/2020 1355   RDW 14.2 09/06/2020 1327   LYMPHSABS 1.3 12/22/2019 0424   MONOABS 0.5 12/22/2019 0424   EOSABS 0.2 12/22/2019 0424   BASOSABS 0.0 12/22/2019 0424    ASSESSMENT AND PLAN: 1. Controlled type 2 diabetes mellitus with diabetic polyneuropathy, without long-term current use of insulin (HCC) At goal.  Continue Farxiga, healthy eating habits and regular exercise - POCT glucose (manual entry) - POCT glycosylated hemoglobin (Hb I6N) - Basic Metabolic Panel - Microalbumin / creatinine urine ratio  2. Hypertension associated with diabetes ([place]) Not at goal.  Patient has not taken any of his medicines as yet for today.  Stressed importance of compliance with medications to control his blood pressure better.  I went over medications  and dosing with him He should be on Norvasc 5 mg daily, Cozaar 50 mg daily, carvedilol 6.25 mg twice a day, hydralazine 50 mg 3 times a day and chlorthalidone 25 mg daily.  3. PAD (peripheral artery disease) (HCC) Stable.  Continue aspirin and statin  4. Hyperlipidemia associated with type 2 diabetes mellitus (HCC) Continue atorvastatin and Zetia  5. Stage 3b chronic kidney disease ([place]) Advised to avoid NSAIDs.   We will continue to monitor. - Basic Metabolic Panel  6. Need for shingles vaccine My CMA forgot to administer.  He was called and will return next week to have this done by the clinical pharmacist.     Patient was given the opportunity to ask questions.  Patient verbalized understanding of the plan and was able to repeat key elements of the plan.   This documentation was completed using Radio producer.  Any transcriptional errors are unintentional.  Orders Placed This Encounter  Procedures   POCT glucose (manual entry)   POCT glycosylated hemoglobin (Hb A1C)     Requested Prescriptions    No prescriptions requested or ordered in this encounter    No follow-ups on file.  Karle Plumber, MD, FACP

## 2021-12-14 ENCOUNTER — Telehealth: Payer: Self-pay | Admitting: Internal Medicine

## 2021-12-14 DIAGNOSIS — E875 Hyperkalemia: Secondary | ICD-10-CM

## 2021-12-14 LAB — BASIC METABOLIC PANEL
BUN/Creatinine Ratio: 12 (ref 10–24)
BUN: 35 mg/dL — ABNORMAL HIGH (ref 8–27)
CO2: 19 mmol/L — ABNORMAL LOW (ref 20–29)
Calcium: 8.5 mg/dL — ABNORMAL LOW (ref 8.6–10.2)
Chloride: 112 mmol/L — ABNORMAL HIGH (ref 96–106)
Creatinine, Ser: 2.81 mg/dL — ABNORMAL HIGH (ref 0.76–1.27)
Glucose: 102 mg/dL — ABNORMAL HIGH (ref 70–99)
Potassium: 5.8 mmol/L — ABNORMAL HIGH (ref 3.5–5.2)
Sodium: 139 mmol/L (ref 134–144)
eGFR: 25 mL/min/{1.73_m2} — ABNORMAL LOW (ref >59)

## 2021-12-14 LAB — MICROALBUMIN / CREATININE URINE RATIO
Creatinine, Urine: 81.1 mg/dL
Microalb/Creat Ratio: 3115 mg/g creat — ABNORMAL HIGH (ref 0–29)
Microalbumin, Urine: 2526.6 ug/mL

## 2021-12-14 MED ORDER — LOKELMA 5 G PO PACK: 5.0000 g | PACK | Freq: Every day | ORAL | 0 refills | Status: DC

## 2021-12-14 NOTE — Telephone Encounter (Signed)
Phone call placed to patient this morning to go over lab results.  Patient informed that his kidney function is not at 100% and remains at stage IV when compared to last lab.  Potassium level also elevated at 5.8.  Advised that high potassium can cause the heart to go into abnormal rhythm which could be fatal.  Advised: -Stop Wilder Glade for now. -Prescription sent to pharmacy for one-time dose of Lokelma.  Pick up today and take it.  Return to the lab on Tuesday of next week for recheck.  Order placed. -Make sure that he is taking only 50 mg of Losartan and not 100 mg -Cut back on potassium rich foods like oranges and bananas. Patient expressed understanding of the plan and was able to repeat back instructions.  Results for orders placed or performed in visit on 24/58/09  Basic Metabolic Panel  Result Value Ref Range   Glucose 102 (H) 70 - 99 mg/dL   BUN 35 (H) 8 - 27 mg/dL   Creatinine, Ser 2.81 (H) 0.76 - 1.27 mg/dL   eGFR 25 (L) >59 mL/min/1.73   BUN/Creatinine Ratio 12 10 - 24   Sodium 139 134 - 144 mmol/L   Potassium 5.8 (H) 3.5 - 5.2 mmol/L   Chloride 112 (H) 96 - 106 mmol/L   CO2 19 (L) 20 - 29 mmol/L   Calcium 8.5 (L) 8.6 - 10.2 mg/dL  Microalbumin / creatinine urine ratio  Result Value Ref Range   Creatinine, Urine WILL FOLLOW    Microalbumin, Urine WILL FOLLOW    Microalb/Creat Ratio WILL FOLLOW   POCT glucose (manual entry)  Result Value Ref Range   POC Glucose 131 (A) 70 - 99 mg/dl  POCT glycosylated hemoglobin (Hb A1C)  Result Value Ref Range   Hemoglobin A1C     HbA1c POC (<> result, manual entry)     HbA1c, POC (prediabetic range)     HbA1c, POC (controlled diabetic range) 5.6 0.0 - 7.0 %

## 2021-12-17 ENCOUNTER — Ambulatory Visit: Payer: Medicare Other

## 2021-12-24 ENCOUNTER — Ambulatory Visit (INDEPENDENT_AMBULATORY_CARE_PROVIDER_SITE_OTHER): Payer: Medicare Other

## 2021-12-24 DIAGNOSIS — I639 Cerebral infarction, unspecified: Secondary | ICD-10-CM

## 2021-12-24 LAB — CUP PACEART REMOTE DEVICE CHECK
Date Time Interrogation Session: 20230729230657
Implantable Pulse Generator Implant Date: 20220429

## 2021-12-31 ENCOUNTER — Ambulatory Visit: Payer: Medicare Other | Admitting: Pharmacist

## 2022-01-17 DIAGNOSIS — E113312 Type 2 diabetes mellitus with moderate nonproliferative diabetic retinopathy with macular edema, left eye: Secondary | ICD-10-CM | POA: Diagnosis not present

## 2022-01-24 DIAGNOSIS — E113312 Type 2 diabetes mellitus with moderate nonproliferative diabetic retinopathy with macular edema, left eye: Secondary | ICD-10-CM | POA: Diagnosis not present

## 2022-01-24 DIAGNOSIS — E113511 Type 2 diabetes mellitus with proliferative diabetic retinopathy with macular edema, right eye: Secondary | ICD-10-CM | POA: Diagnosis not present

## 2022-01-24 NOTE — Progress Notes (Signed)
Carelink Summary Report / Loop Recorder 

## 2022-01-25 ENCOUNTER — Ambulatory Visit (INDEPENDENT_AMBULATORY_CARE_PROVIDER_SITE_OTHER): Payer: Medicare Other

## 2022-01-25 DIAGNOSIS — I639 Cerebral infarction, unspecified: Secondary | ICD-10-CM | POA: Diagnosis not present

## 2022-01-25 LAB — CUP PACEART REMOTE DEVICE CHECK
Date Time Interrogation Session: 20230831230538
Implantable Pulse Generator Implant Date: 20220429

## 2022-02-05 ENCOUNTER — Ambulatory Visit: Payer: Medicare Other | Admitting: Pharmacist

## 2022-02-12 NOTE — Progress Notes (Signed)
Carelink Summary Report / Loop Recorder 

## 2022-02-27 ENCOUNTER — Ambulatory Visit (INDEPENDENT_AMBULATORY_CARE_PROVIDER_SITE_OTHER): Payer: Medicare Other

## 2022-02-27 DIAGNOSIS — I639 Cerebral infarction, unspecified: Secondary | ICD-10-CM

## 2022-02-27 LAB — CUP PACEART REMOTE DEVICE CHECK
Date Time Interrogation Session: 20231003230544
Implantable Pulse Generator Implant Date: 20220429

## 2022-03-04 NOTE — Progress Notes (Signed)
Carelink Summary Report / Loop Recorder 

## 2022-03-14 ENCOUNTER — Ambulatory Visit: Payer: Medicare Other | Admitting: Internal Medicine

## 2022-03-14 DIAGNOSIS — E113312 Type 2 diabetes mellitus with moderate nonproliferative diabetic retinopathy with macular edema, left eye: Secondary | ICD-10-CM | POA: Diagnosis not present

## 2022-03-19 ENCOUNTER — Telehealth: Payer: Self-pay | Admitting: *Deleted

## 2022-03-19 NOTE — Telephone Encounter (Signed)
Copied from [place] [phone]. Topic: Appointment Scheduling - Scheduling Inquiry for Clinic >> Mar 18, 2022 10:49 AM Erskine Squibb wrote: Reason for CRM: Patient called in wanting to reschedule his AWV. Please assist patient further

## 2022-03-20 NOTE — Telephone Encounter (Signed)
Left message on voicemail to return call.   Please inform patient upon returning call- He had a Welcome to [facility] 08/03/2021. Not due for an initial until 1 year and 1 day. Can keep apt in December to address DM and HTN.

## 2022-04-01 ENCOUNTER — Ambulatory Visit (INDEPENDENT_AMBULATORY_CARE_PROVIDER_SITE_OTHER): Payer: Medicare Other

## 2022-04-01 DIAGNOSIS — I639 Cerebral infarction, unspecified: Secondary | ICD-10-CM | POA: Diagnosis not present

## 2022-04-01 LAB — CUP PACEART REMOTE DEVICE CHECK
Date Time Interrogation Session: 20231105231158
Implantable Pulse Generator Implant Date: 20220429

## 2022-04-03 ENCOUNTER — Telehealth: Payer: Self-pay | Admitting: Emergency Medicine

## 2022-04-03 NOTE — Telephone Encounter (Signed)
Copied from [place] [id number]. Topic: General - Other >> Apr 03, 2022  2:12 PM [place] B wrote: Reason for [id number] returning call he says received 2 days, not sure what it was about. Im onto showing recent phone calls. Please call back

## 2022-04-05 NOTE — Telephone Encounter (Signed)
Left message on voicemail to return call.    Please inform patient upon returning call- He had a Welcome to [facility] 08/03/2021. Not due for an initial until 1 year and 1 day. Can keep apt in December to address DM and HTN.

## 2022-04-12 ENCOUNTER — Ambulatory Visit: Payer: Medicare Other | Admitting: Internal Medicine

## 2022-04-29 NOTE — Progress Notes (Signed)
Carelink Summary Report / Loop Recorder 

## 2022-05-02 ENCOUNTER — Ambulatory Visit: Payer: Medicare Other | Admitting: Internal Medicine

## 2022-05-03 ENCOUNTER — Encounter: Payer: Self-pay | Admitting: Internal Medicine

## 2022-05-03 ENCOUNTER — Ambulatory Visit: Payer: Medicare Other | Attending: Internal Medicine | Admitting: Internal Medicine

## 2022-05-03 VITALS — BP 200/120 | HR 93 | Ht 68.0 in | Wt 154.2 lb

## 2022-05-03 DIAGNOSIS — Z2821 Immunization not carried out because of patient refusal: Secondary | ICD-10-CM

## 2022-05-03 DIAGNOSIS — E1121 Type 2 diabetes mellitus with diabetic nephropathy: Secondary | ICD-10-CM

## 2022-05-03 DIAGNOSIS — E1122 Type 2 diabetes mellitus with diabetic chronic kidney disease: Secondary | ICD-10-CM

## 2022-05-03 DIAGNOSIS — E875 Hyperkalemia: Secondary | ICD-10-CM

## 2022-05-03 DIAGNOSIS — Z1331 Encounter for screening for depression: Secondary | ICD-10-CM

## 2022-05-03 DIAGNOSIS — E441 Mild protein-calorie malnutrition: Secondary | ICD-10-CM | POA: Diagnosis not present

## 2022-05-03 DIAGNOSIS — N184 Chronic kidney disease, stage 4 (severe): Secondary | ICD-10-CM | POA: Diagnosis not present

## 2022-05-03 DIAGNOSIS — R600 Localized edema: Secondary | ICD-10-CM

## 2022-05-03 DIAGNOSIS — I129 Hypertensive chronic kidney disease with stage 1 through stage 4 chronic kidney disease, or unspecified chronic kidney disease: Secondary | ICD-10-CM | POA: Diagnosis not present

## 2022-05-03 LAB — POCT GLYCOSYLATED HEMOGLOBIN (HGB A1C): HbA1c, POC (controlled diabetic range): 5.5 % (ref 0.0–7.0)

## 2022-05-03 MED ORDER — FUROSEMIDE 40 MG PO TABS: 20.0000 mg | ORAL_TABLET | Freq: Every day | ORAL | 3 refills | Status: DC

## 2022-05-03 NOTE — Progress Notes (Signed)
Patient ID: Douglas Briggs, male    DOB: 12-Jan-1961  MRN: [id number]  CC: Hypertension   Subjective: Douglas Briggs is a 61 y.o. 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  Did not take meds as yet today.  Had dental appt and did not want to take med when driving.  He is supposed to be on amlodipine 5 mg daily, carvedilol 6.25 mg twice a day, chlorthalidone, hydralazine 50 mg 3 times a day, Cozaar 50 mg daily. Med Adherence: [x]  Yes    []  No Medication side effects: []  Yes    [x]  No Adherence with salt restriction: [x]  Yes    []  No Home Monitoring?: [x]  Yes    []  No Monitoring Frequency:  not often Home BP results range:  SOB? []  Yes    [x]  No Chest Pain?: []  Yes    [x]  No Leg swelling?: [x]  Yes    []  No Headaches?: []  Yes    [x]  No Dizziness? []  Yes    [x]  No Comments:   DM/CKD 4: Results for orders placed or performed in visit on 05/03/22  POCT glycosylated hemoglobin (Hb A1C)  Result Value Ref Range   Hemoglobin A1C     HbA1c POC (<> result, manual entry)     HbA1c, POC (prediabetic range)     HbA1c, POC (controlled diabetic range) 5.5 0.0 - 7.0 %  -After last visit with me in July, chemistry came back with potassium level of 5.8, GFR of 25 and creatinine of 2.81.  I advised patient to stop the [place].  Prescription was sent for a one-time dose of Lokelma.  Patient was supposed to return to the lab for repeat check but did not. He thinks he stopped the [place]. Last several albumin low in the 2's Reports appetite goes and comes He is not sure the last time  he saw the kidney specialist.  He had 3.1 g of protein in the urine 4 months ago on urine microalbumin.  HL: Should be on atorvastatin 40 mg 2 tablets daily and Zetia 10 mg daily.  He thinks he is taking.  Pos Dep Screen: With positive answer on screening for suicidal ideation.  When I asked him about it verbally, patient states that he missed read the question.  He denies any suicidal ideation at this time.  Marland Kitchen    HM: declines flu shot and RSV.  Declines COVID booster and shingrix Patient Active Problem List   Diagnosis Date Noted   PAD (peripheral artery disease) ([place]) 12/13/2021   Stroke ([place] 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   HLD (hyperlipidemia)    Normocytic anemia    CKD (chronic kidney disease)  Essential hypertension 11/22/2019   Atherosclerotic vascular disease 11/22/2019   Acute CVA (cerebrovascular accident) ([place]) 10/30/2019   TIA (transient ischemic attack) 10/29/2019   Stroke-like symptoms 10/29/2019   Right sided numbness    Acquired hammer toe of left foot 03/12/2017   Tinea pedis of both feet 03/12/2017   Male hypogonadism 11/14/2016   Restless leg syndrome 11/14/2016   Uncontrolled type 2 diabetes mellitus with diabetic polyneuropathy, without long-term current use of insulin 11/12/2016     Current Outpatient Medications on File Prior to Visit  Medication Sig Dispense Refill   amLODipine (NORVASC) 5 MG tablet Take 1 tablet (5 mg total) by mouth daily. 90 tablet 1   aspirin EC 81 MG tablet Take 81 mg by mouth daily. Swallow whole.      atorvastatin (LIPITOR) 40 MG tablet Take 2 tablets (80 mg total) by mouth daily in the afternoon. 180 tablet 1   carvedilol (COREG) 6.25 MG tablet Take 1 tablet (6.25 mg total) by mouth 2 (two) times daily with a meal. 60 tablet 2   ezetimibe (ZETIA) 10 MG tablet Take 10 mg by mouth daily.     famotidine (PEPCID) 20 MG tablet Take 20 mg by mouth 2 (two) times daily as needed for heartburn or indigestion.     hydrALAZINE (APRESOLINE) 50 MG tablet Take 1 tablet (50 mg total) by mouth 3 (three) times daily. STOP Clonidine 90 tablet 6   ketorolac (ACULAR) 0.5 % ophthalmic solution SMARTSIG:In Eye(s)     losartan (COZAAR) 50 MG tablet Take 1 tablet (50 mg total) by mouth daily. 30 tablet 6   prednisoLONE acetate (PRED FORTE) 1 % ophthalmic suspension      sodium zirconium cyclosilicate (LOKELMA) 5 g packet Take 5 g by mouth daily. Mix 1 packet with 3 tablespoons of water and take p.o. 1 packet 0   No current facility-administered medications on file prior to visit.    No Known Allergies  Social History   Socioeconomic History   Marital status: Single    Spouse name: Not on file   Number of children: Not on file   Years of education: Not on file   Highest education level: Not on file  Occupational History   Not on file  Tobacco Use   Smoking status: Never   Smokeless tobacco: Never  Vaping Use   Vaping Use: Never used  Substance and Sexual Activity   Alcohol use: No   Drug use: Never   Sexual activity: Not on file  Other Topics Concern   Not on file  Social History Narrative   Not on file   Social Determinants of Health   Financial Resource Strain: Not on file  Food Insecurity: Not on file  Transportation Needs: Not on file  Physical Activity: Not on file  Stress: Not on file  Social Connections: Not on file  Intimate Partner Violence: Not on file    Family History  Problem Relation Age of Onset   Hypertension Father    Cancer Father    Diabetes Paternal Aunt    Heart  disease Neg Hx     Past Surgical History:  Procedure Laterality Date   AMPUTATION Left 12/20/2019   Procedure: AMPUTATION 5TH TOE;  Surgeon: Erle Crocker, MD;  Location: [place];  Service: Orthopedics;  Laterality: Left;   BUBBLE STUDY  09/22/2020   Procedure: BUBBLE STUDY;  Surgeon: Donato Heinz, MD;  Location: [place];  Service: Cardiovascular;;   CATARACT EXTRACTION Bilateral 02/2021  LOOP RECORDER INSERTION N/A 09/22/2020   Procedure: LOOP RECORDER INSERTION;  Surgeon: Constance Haw, MD;  Location: [place] CV LAB;  Service: Cardiovascular;  Laterality: N/A;   TEE WITHOUT CARDIOVERSION N/A 09/22/2020   Procedure: TRANSESOPHAGEAL ECHOCARDIOGRAM (TEE);  Surgeon: Donato Heinz, MD;  Location: [facility] ENDOSCOPY;  Service: Cardiovascular;  Laterality: N/A;  LOOP    ROS: Review of Systems Negative except as stated above  PHYSICAL EXAM: BP (!) 200/120   Pulse 93   Ht 5\' 8"  (1.727 m)   Wt 154 lb 3.2 oz (69.9 kg)   SpO2 98%   BMI 23.45 kg/m   Wt Readings from Last 3 Encounters:  05/03/22 154 lb 3.2 oz (69.9 kg)  12/13/21 153 lb (69.4 kg)  08/30/21 156 lb (70.8 kg)    Physical Exam 1+ edema legs.  General appearance - alert, well appearing, and in no distress Mental status - normal mood, behavior, speech, dress, motor activity, and thought processes Neck - supple, no significant adenopathy Chest - clear to auscultation, no wheezes, rales or rhonchi, symmetric air entry Heart -regular rate and rhythm.  Soft systolic ejection murmur heard along the left sternal border. Extremities -1+ bilateral lower extremity edema in the lower third of the legs including ankles and feet.    05/03/2022    2:36 PM 12/13/2021    1:47 PM 08/30/2021   11:39 AM  Depression screen PHQ 2/9  Decreased Interest 0 0 1  Down, Depressed, Hopeless 2 0 1  PHQ - 2 Score 2 0 2  Altered sleeping 1  0  Tired, decreased energy 1  1  Change in appetite 2    Feeling bad or  failure about yourself  1  1  Trouble concentrating 1  0  Moving slowly or fidgety/restless 1  1  Suicidal thoughts 1  0  PHQ-9 Score 10  5       Latest Ref Rng & Units 12/13/2021    2:27 PM 09/21/2021   10:51 AM 04/16/2021    4:14 PM  CMP  Glucose 70 - 99 mg/dL 102  131  76   BUN 8 - 27 mg/dL 35  29  26   Creatinine 0.76 - 1.27 mg/dL 2.81  2.94  2.04   Sodium 134 - 144 mmol/L 139  139  140   Potassium 3.5 - 5.2 mmol/L 5.8  5.2  4.9   Chloride 96 - 106 mmol/L 112  111  111   CO2 20 - 29 mmol/L 19  20  21    Calcium 8.6 - 10.2 mg/dL 8.5  8.7  8.4   Total Protein 6.0 - 8.5 g/dL   5.6   Total Bilirubin 0.0 - 1.2 mg/dL   <0.2   Alkaline Phos 44 - 121 IU/L   125   AST 0 - 40 IU/L   14   ALT 0 - 44 IU/L   17    Lipid Panel     Component Value Date/Time   CHOL 352 (H) 04/16/2021 1614   TRIG 197 (H) 04/16/2021 1614   HDL 66 04/16/2021 1614   CHOLHDL 5.3 (H) 04/16/2021 1614   CHOLHDL 4.4 12/21/2019 0123   VLDL 23 12/21/2019 0123   LDLCALC 247 (H) 04/16/2021 1614    CBC    Component Value Date/Time   WBC 4.2 09/06/2020 1327   WBC 5.6 02/18/2020 1355   RBC 4.08 (L) 09/06/2020 1327   RBC 3.99 (L) 02/18/2020 1355   HGB 11.1 (L)  09/06/2020 1327   HCT 34.6 (L) 09/06/2020 1327   PLT 164 09/06/2020 1327   MCV 85 09/06/2020 1327   MCH 27.2 09/06/2020 1327   MCH 27.3 02/18/2020 1355   MCHC 32.1 09/06/2020 1327   MCHC 33.2 02/18/2020 1355   RDW 14.2 09/06/2020 1327   LYMPHSABS 1.3 12/22/2019 0424   MONOABS 0.5 12/22/2019 0424   EOSABS 0.2 12/22/2019 0424   BASOSABS 0.0 12/22/2019 0424    ASSESSMENT AND PLAN:  1. Type 2 diabetes mellitus with diabetic nephropathy, without long-term current use of insulin (HCC) Diet control. Wilder Glade was stopped a few days after the last visit with me due to worsening kidney function and increased potassium. - POCT glycosylated hemoglobin (Hb A1C) - Comprehensive metabolic panel - Lipid panel  2. 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 I think compliance is suboptimal and he has not taken his medicines at all today.  Encouraged him to take his medicines every day as prescribed.  Discussed the importance of good blood pressure control to help preserve what little kidney function he has left. Encouraged him to get back in with nephrology. Stop chlorthalidone.  Start furosemide instead given the lower extremity edema. - Comprehensive metabolic panel - furosemide (LASIX) 40 MG tablet; Take 0.5 tablets (20 mg total) by mouth daily. Stop chlorthalidone  Dispense: 30 tablet; Refill: 3  3. Edema of both legs See #2 above. - furosemide (LASIX) 40 MG tablet; Take 0.5 tablets (20 mg total) by mouth daily. Stop chlorthalidone  Dispense: 30 tablet; Refill: 3  4. Hyperkalemia Recheck chemistry today.  5. Positive depression screening Patient denies that depression is an issue for him.  He denies any suicidal ideation.  Does not feel he needs to be on any medication or counseling.  6. Influenza vaccination declined  7.  Protein calorie malnutrition  based on low albumin level.  Recheck chemistry today.   Patient was given the opportunity to ask questions.  Patient verbalized understanding of the plan and was able to repeat key elements of the plan.   This documentation was completed using Radio producer.  Any transcriptional errors are unintentional.  Orders Placed This Encounter  Procedures   Comprehensive metabolic panel   Lipid panel   POCT glycosylated hemoglobin (Hb A1C)     Requested Prescriptions   Signed Prescriptions Disp Refills   furosemide (LASIX) 40 MG tablet 30 tablet 3    Sig: Take 0.5 tablets (20 mg total) by mouth daily. Stop chlorthalidone    Return in about 3 weeks (around 05/24/2022).  Karle Plumber, MD, FACP

## 2022-05-03 NOTE — Patient Instructions (Signed)
Stop chlorthalidone. Start furosemide 40 mg daily instead. Your blood pressure is significantly uncontrolled.  Please take your medications as soon as you return home.

## 2022-05-04 ENCOUNTER — Telehealth: Payer: Self-pay | Admitting: Internal Medicine

## 2022-05-04 ENCOUNTER — Other Ambulatory Visit: Payer: Self-pay | Admitting: Internal Medicine

## 2022-05-04 DIAGNOSIS — I10 Essential (primary) hypertension: Secondary | ICD-10-CM

## 2022-05-04 DIAGNOSIS — E1169 Type 2 diabetes mellitus with other specified complication: Secondary | ICD-10-CM

## 2022-05-04 LAB — COMPREHENSIVE METABOLIC PANEL
ALT: 13 IU/L (ref 0–44)
AST: 10 IU/L (ref 0–40)
Albumin/Globulin Ratio: 0.9 — ABNORMAL LOW (ref 1.2–2.2)
Albumin: 2.5 g/dL — ABNORMAL LOW (ref 3.9–4.9)
Alkaline Phosphatase: 129 IU/L — ABNORMAL HIGH (ref 44–121)
BUN/Creatinine Ratio: 7 — ABNORMAL LOW (ref 10–24)
BUN: 29 mg/dL — ABNORMAL HIGH (ref 8–27)
Bilirubin Total: <0.2 mg/dL (ref 0.0–1.2)
CO2: 19 mmol/L — ABNORMAL LOW (ref 20–29)
Calcium: 8 mg/dL — ABNORMAL LOW (ref 8.6–10.2)
Chloride: 109 mmol/L — ABNORMAL HIGH (ref 96–106)
Creatinine, Ser: 3.98 mg/dL — ABNORMAL HIGH (ref 0.76–1.27)
Globulin, Total: 2.8 g/dL (ref 1.5–4.5)
Glucose: 86 mg/dL (ref 70–99)
Potassium: 5 mmol/L (ref 3.5–5.2)
Sodium: 138 mmol/L (ref 134–144)
Total Protein: 5.3 g/dL — ABNORMAL LOW (ref 6.0–8.5)
eGFR: 16 mL/min/{1.73_m2} — ABNORMAL LOW (ref >59)

## 2022-05-04 LAB — LIPID PANEL
Chol/HDL Ratio: 5.1 ratio — ABNORMAL HIGH (ref 0.0–5.0)
Cholesterol, Total: 395 mg/dL — ABNORMAL HIGH (ref 100–199)
HDL: 78 mg/dL (ref >39)
LDL Chol Calc (NIH): 284 mg/dL — ABNORMAL HIGH (ref 0–99)
Triglycerides: 168 mg/dL — ABNORMAL HIGH (ref 0–149)
VLDL Cholesterol Cal: 33 mg/dL (ref 5–40)

## 2022-05-04 NOTE — Telephone Encounter (Signed)
Phone call placed to patient this morning.  Patient informed that his kidney function is not at 100% and continues to decline.  I inquired whether he has an appointment with his nephrologist and patient states that he does not.  I recommend that he calls on Monday to schedule a follow-up appointment with his nephrologist as soon as possible. His total and LDL cholesterol are significantly elevated.  I inquired whether he is taking the atorvastatin 80 mg daily.  Patient states he does not take it every day because it makes him feel dizzy.  We discussed referring To lipid clinic to be considered for alternative medication if he is intolerant of statin therapy.  Patient is agreeable to this.  Addendum:  phone call placed to pt again 3 min after our first conversation.  I got his VM.  I left message informing him to make sure he has stopped the [facility] given his kidney function.

## 2022-05-06 ENCOUNTER — Ambulatory Visit (INDEPENDENT_AMBULATORY_CARE_PROVIDER_SITE_OTHER): Payer: Medicare Other

## 2022-05-06 DIAGNOSIS — I639 Cerebral infarction, unspecified: Secondary | ICD-10-CM

## 2022-05-06 LAB — CUP PACEART REMOTE DEVICE CHECK
Date Time Interrogation Session: 20231210230723
Implantable Pulse Generator Implant Date: 20220429

## 2022-05-08 DIAGNOSIS — N189 Chronic kidney disease, unspecified: Secondary | ICD-10-CM | POA: Diagnosis not present

## 2022-05-08 DIAGNOSIS — R809 Proteinuria, unspecified: Secondary | ICD-10-CM | POA: Diagnosis not present

## 2022-05-08 DIAGNOSIS — I129 Hypertensive chronic kidney disease with stage 1 through stage 4 chronic kidney disease, or unspecified chronic kidney disease: Secondary | ICD-10-CM | POA: Diagnosis not present

## 2022-05-08 DIAGNOSIS — N1832 Chronic kidney disease, stage 3b: Secondary | ICD-10-CM | POA: Diagnosis not present

## 2022-05-08 DIAGNOSIS — D631 Anemia in chronic kidney disease: Secondary | ICD-10-CM | POA: Diagnosis not present

## 2022-05-08 DIAGNOSIS — N179 Acute kidney failure, unspecified: Secondary | ICD-10-CM | POA: Diagnosis not present

## 2022-05-08 DIAGNOSIS — N2581 Secondary hyperparathyroidism of renal origin: Secondary | ICD-10-CM | POA: Diagnosis not present

## 2022-05-13 ENCOUNTER — Other Ambulatory Visit (HOSPITAL_COMMUNITY): Payer: Self-pay | Admitting: Nephrology

## 2022-05-13 DIAGNOSIS — N179 Acute kidney failure, unspecified: Secondary | ICD-10-CM

## 2022-05-14 ENCOUNTER — Ambulatory Visit: Payer: Medicare Other | Admitting: Internal Medicine

## 2022-05-22 DIAGNOSIS — E875 Hyperkalemia: Secondary | ICD-10-CM | POA: Diagnosis not present

## 2022-05-30 ENCOUNTER — Other Ambulatory Visit: Payer: Self-pay | Admitting: Radiology

## 2022-05-30 DIAGNOSIS — N179 Acute kidney failure, unspecified: Secondary | ICD-10-CM

## 2022-05-31 ENCOUNTER — Other Ambulatory Visit: Payer: Self-pay | Admitting: Student

## 2022-05-31 ENCOUNTER — Other Ambulatory Visit: Payer: Self-pay | Admitting: Internal Medicine

## 2022-05-31 NOTE — H&P (Incomplete)
Chief Complaint: Patient was seen in consultation today for acute kidney injury  Referring Physician(s): Singh,Vikas  Supervising Physician: Corrie Mckusick  Patient Status: [facility] - Out-pt  History of Present Illness: Douglas Briggs is a 62 y.o. male with past medical history of HTN, HLD, DM 2, chronic left SFA occlusion/PAD, CKD III with continued decline in renal function.  He is referred to [facility] for random renal biopsy.   Douglas Briggs presents to [facility] today in his usual state of health.  He has *** been NPO.  He has*** held his aspirin for 5 days.  His *** is available today for post procedure care and transportation.   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 Dr. Tacy Dura - seen 11/24/19   Protein-calorie malnutrition, mild ([place]) 09/11/2020   Restless leg syndrome 11/14/2016   Right sided numbness    Stroke ([place])    Stroke-like symptoms 10/29/2019   TIA (transient ischemic attack) 10/29/2019   Tinea pedis of both feet 03/12/2017   Ulcer of left lower extremity with fat layer exposed ([place]) 01/20/2020   Uncontrolled type 2 diabetes mellitus with diabetic polyneuropathy, without long-term current use of insulin 11/12/2016    Past Surgical History:   Procedure Laterality Date   AMPUTATION Left 12/20/2019   Procedure: AMPUTATION 5TH TOE;  Surgeon: Erle Crocker, MD;  Location: [place];  Service: Orthopedics;  Laterality: Left;   BUBBLE STUDY  09/22/2020   Procedure: BUBBLE STUDY;  Surgeon: Donato Heinz, MD;  Location: [place];  Service: Cardiovascular;;   CATARACT EXTRACTION Bilateral 02/2021   LOOP RECORDER INSERTION N/A 09/22/2020   Procedure: LOOP RECORDER INSERTION;  Surgeon: Constance Haw, MD;  Location: [place] CV LAB;  Service: Cardiovascular;  Laterality: N/A;   TEE WITHOUT CARDIOVERSION N/A 09/22/2020   Procedure: TRANSESOPHAGEAL ECHOCARDIOGRAM (TEE);  Surgeon: Donato Heinz, MD;  Location: [facility] ENDOSCOPY;  Service: Cardiovascular;  Laterality: N/A;  LOOP    Allergies: Patient has no known allergies.  Medications: Prior to Admission medications   Medication Sig Start Date End Date Taking? Authorizing Provider  amLODipine (NORVASC) 5 MG tablet Take 1 tablet (5 mg total) by mouth daily. 09/21/21   Ladell Pier, MD  aspirin EC 81 MG tablet Take 81 mg by mouth daily. Swallow whole.    [provider]  atorvastatin (LIPITOR) 40 MG tablet Take 2 tablets (80 mg total) by mouth daily in the afternoon. 08/22/21   Ladell Pier, MD  carvedilol (COREG) 6.25 MG tablet Take 1 tablet (6.25 mg total) by mouth 2 (two) times daily with a meal. 08/30/21   Ladell Pier, MD  ezetimibe (ZETIA) 10 MG tablet Take 10 mg by mouth daily.    [provider]  famotidine (PEPCID) 20 MG  tablet Take 20 mg by mouth 2 (two) times daily as needed for heartburn or indigestion.    [provider]  furosemide (LASIX) 40 MG tablet Take 0.5 tablets (20 mg total) by mouth daily. Stop chlorthalidone 05/03/22   Ladell Pier, MD  hydrALAZINE (APRESOLINE) 50 MG tablet Take 1 tablet (50 mg total) by mouth 3 (three) times daily. STOP Clonidine 08/30/21   Ladell Pier, MD  ketorolac  (ACULAR) 0.5 % ophthalmic solution SMARTSIG:In Eye(s) 03/21/21   [provider]  losartan (COZAAR) 50 MG tablet Take 1 tablet (50 mg total) by mouth daily. 08/30/21   Ladell Pier, MD  prednisoLONE acetate (PRED FORTE) 1 % ophthalmic suspension  03/21/21   [provider]  sodium zirconium cyclosilicate (LOKELMA) 5 g packet Take 5 g by mouth daily. Mix 1 packet with 3 tablespoons of water and take p.o. 12/14/21   Ladell Pier, MD     Family History  Problem Relation Age of Onset   Hypertension Father    Cancer Father    Diabetes Paternal Aunt    Heart disease Neg Hx     Social History   Socioeconomic History   Marital status: Single    Spouse name: Not on file   Number of children: Not on file   Years of education: Not on file   Highest education level: Not on file  Occupational History   Not on file  Tobacco Use   Smoking status: Never   Smokeless tobacco: Never  Vaping Use   Vaping Use: Never used  Substance and Sexual Activity   Alcohol use: No   Drug use: Never   Sexual activity: Not on file  Other Topics Concern   Not on file  Social History Narrative   Not on file   Social Determinants of Health   Financial Resource Strain: Not on file  Food Insecurity: Not on file  Transportation Needs: Not on file  Physical Activity: Not on file  Stress: Not on file  Social Connections: Not on file     Review of Systems: A 12 point ROS discussed and pertinent positives are indicated in the HPI above.  All other systems are negative.  Review of Systems  Vital Signs: There were no vitals taken for this visit.  Physical Exam       Imaging: CUP PACEART REMOTE DEVICE CHECK  Result Date: 05/06/2022 ILR summary report received. Battery status OK. Normal device function. No new symptom, tachy, brady, or pause episodes. No new AF episodes. Monthly summary reports and ROV/PRN LA   Labs:  CBC: No results for input(s): "WBC", "HGB", "HCT",  "PLT" in the last 8760 hours.  COAGS: No results for input(s): "INR", "APTT" in the last 8760 hours.  BMP: Recent Labs    09/21/21 1051 12/13/21 1427 05/03/22 1542  NA 139 139 138  K 5.2 5.8* 5.0  CL 111* 112* 109*  CO2 20 19* 19*  GLUCOSE 131* 102* 86  BUN 29* 35* 29*  CALCIUM 8.7 8.5* 8.0*  CREATININE 2.94* 2.81* 3.98*    LIVER FUNCTION TESTS: Recent Labs    05/03/22 1542  BILITOT <0.2  AST 10  ALT 13  ALKPHOS 129*  PROT 5.3*  ALBUMIN 2.5*    TUMOR MARKERS: No results for input(s): "AFPTM", "CEA", "CA199", "CHROMGRNA" in the last 8760 hours.  Assessment and Plan: Patient with past medical history of HLD, HTN, CKD III, DM presents with complaint of worsening renal dysfunction.  IR  consulted for random renal biopsy at the request of Dr. Candiss Norse. Case reviewed by Dr. Earleen Newport who approves patient for procedure.  Patient presents today in their usual state of health.  He has been NPO and is not currently on blood thinners.  BP this AM ***  Risks and benefits of biopsy was discussed with the patient and/or patient's family including, but not limited to bleeding, infection, damage to adjacent structures or low yield requiring additional tests.  All of the questions were answered and there is agreement to proceed.  Consent signed and in chart.  Thank you for this interesting consult.  I greatly enjoyed meeting Jiaire Rosebrook and look forward to participating in their care.  A copy of this report was sent to the requesting provider on this date.  Electronically Signed: Docia Barrier, PA 05/31/2022, 12:54 PM   I spent a total of  30 Minutes   in face to face in clinical consultation, greater than 50% of which was counseling/coordinating care for acute kidney injury.

## 2022-06-03 ENCOUNTER — Ambulatory Visit (HOSPITAL_COMMUNITY)
Admission: RE | Admit: 2022-06-03 | Discharge: 2022-06-03 | Disposition: A | Discharge status: Home / Self Care | Payer: Medicare Other | Source: Ambulatory Visit | Attending: Nephrology | Admitting: Nephrology

## 2022-06-06 DIAGNOSIS — E113312 Type 2 diabetes mellitus with moderate nonproliferative diabetic retinopathy with macular edema, left eye: Secondary | ICD-10-CM | POA: Diagnosis not present

## 2022-06-07 NOTE — Progress Notes (Signed)
[facility] Summary Report / Loop Recorder

## 2022-06-10 ENCOUNTER — Ambulatory Visit (INDEPENDENT_AMBULATORY_CARE_PROVIDER_SITE_OTHER): Payer: Medicare Other

## 2022-06-10 DIAGNOSIS — I639 Cerebral infarction, unspecified: Secondary | ICD-10-CM

## 2022-06-11 LAB — CUP PACEART REMOTE DEVICE CHECK
Date Time Interrogation Session: 20240112230845
Implantable Pulse Generator Implant Date: 20220429

## 2022-06-13 ENCOUNTER — Other Ambulatory Visit: Payer: Self-pay | Admitting: Internal Medicine

## 2022-06-13 DIAGNOSIS — I10 Essential (primary) hypertension: Secondary | ICD-10-CM

## 2022-06-14 ENCOUNTER — Encounter: Payer: Self-pay | Admitting: Internal Medicine

## 2022-06-14 ENCOUNTER — Ambulatory Visit: Payer: 59 | Attending: Internal Medicine | Admitting: Internal Medicine

## 2022-06-14 VITALS — BP 217/95 | HR 81 | Wt 153.8 lb

## 2022-06-14 DIAGNOSIS — E1122 Type 2 diabetes mellitus with diabetic chronic kidney disease: Secondary | ICD-10-CM | POA: Diagnosis not present

## 2022-06-14 DIAGNOSIS — I739 Peripheral vascular disease, unspecified: Secondary | ICD-10-CM

## 2022-06-14 DIAGNOSIS — N184 Chronic kidney disease, stage 4 (severe): Secondary | ICD-10-CM | POA: Diagnosis not present

## 2022-06-14 DIAGNOSIS — I129 Hypertensive chronic kidney disease with stage 1 through stage 4 chronic kidney disease, or unspecified chronic kidney disease: Secondary | ICD-10-CM | POA: Diagnosis not present

## 2022-06-14 NOTE — Progress Notes (Signed)
Patient ID: Douglas Briggs, male    DOB: [birth year]  MRN: [id number]  CC: Hypertension   Subjective: Douglas Briggs is a 62 y.o. 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  Pt had LE edema.  We changed chlorthalidone  to Lasix BP elevated today.  Ate salty liver pudding yesterday No CP/SOB. Swelling in legs better Has appt with his nephrology 06/19/2022. GFR on last visit have declined from the 20s to 16  PAD:  no claudication symptoms in legs.  Reports compliance with taking atorvastatin, Zetia, aspirin  Patient Active Problem List   Diagnosis Date Noted   PAD (peripheral artery disease) ([place]) 12/13/2021   Stroke ([place])    Hypertension    Neuropathy    Protein-calorie malnutrition, mild ([place]) 09/11/2020   Hyperlipidemia associated with type 2 diabetes mellitus ([place]) 09/11/2020   Diabetes mellitus due to underlying condition with retinopathy of both eyes ([place])    Hypertension associated with diabetes ([place])    NPDR (nonproliferative diabetic retinopathy) ([place]) 02/09/2020   Influenza vaccination declined 01/20/2020   Cellulitis 12/19/2019   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   HLD (hyperlipidemia)    Normocytic anemia    CKD (chronic kidney disease)    Essential hypertension 11/22/2019   Atherosclerotic vascular disease 11/22/2019   Acute CVA (cerebrovascular accident) ([place]) 10/30/2019   TIA (transient ischemic attack) 10/29/2019   Stroke-like symptoms 10/29/2019   Right sided numbness    Acquired hammer toe of left foot 03/12/2017   Tinea pedis of both feet 03/12/2017    Male hypogonadism 11/14/2016   Restless leg syndrome 11/14/2016   Uncontrolled type 2 diabetes mellitus with diabetic polyneuropathy, without long-term current use of insulin 11/12/2016     Current Outpatient Medications on File Prior to Visit  Medication Sig Dispense Refill   amLODipine (NORVASC) 5 MG tablet Take 1 tablet (5 mg total) by mouth daily. 90 tablet 1   aspirin EC 81 MG tablet Take 81 mg by mouth daily. Swallow whole.     atorvastatin (LIPITOR) 40 MG tablet Take 2 tablets (80 mg total) by mouth daily in the afternoon. 180 tablet 1   carvedilol (COREG) 6.25 MG tablet Take 1 tablet (6.25 mg total) by mouth 2 (two) times daily with a meal. 60 tablet 2   ezetimibe (ZETIA) 10 MG tablet Take 10 mg by mouth daily.     famotidine (PEPCID) 20 MG tablet Take 20 mg by mouth 2 (two) times daily as needed for heartburn or indigestion.     furosemide (LASIX) 40 MG tablet Take 0.5 tablets (20 mg total) by mouth daily. Stop chlorthalidone 30 tablet 3   hydrALAZINE (APRESOLINE) 50 MG tablet Take 1 tablet (50 mg total) by mouth 3 (three) times daily. STOP Clonidine 90 tablet 6   ketorolac (ACULAR) 0.5 % ophthalmic solution SMARTSIG:In Eye(s)     losartan (COZAAR) 50 MG tablet Take 1 tablet (50 mg total) by mouth daily. 30 tablet 6   prednisoLONE acetate (PRED FORTE) 1 % ophthalmic suspension      sodium zirconium  cyclosilicate (LOKELMA) 5 g packet Take 5 g by mouth daily. Mix 1 packet with 3 tablespoons of water and take p.o. 1 packet 0   No current facility-administered medications on file prior to visit.    No Known Allergies  Social History   Socioeconomic History   Marital status: Single    Spouse name: Not on file   Number of children: Not on file   Years of education: Not on file   Highest education level: Not on file  Occupational History   Not on file  Tobacco Use   Smoking status: Never   Smokeless tobacco: Never  Vaping Use   Vaping Use: Never used  Substance and Sexual  Activity   Alcohol use: No   Drug use: Never   Sexual activity: Not on file  Other Topics Concern   Not on file  Social History Narrative   Not on file   Social Determinants of Health   Financial Resource Strain: Not on file  Food Insecurity: Not on file  Transportation Needs: Not on file  Physical Activity: Not on file  Stress: Not on file  Social Connections: Not on file  Intimate Partner Violence: Not on file    Family History  Problem Relation Age of Onset   Hypertension Father    Cancer Father    Diabetes Paternal Aunt    Heart disease Neg Hx     Past Surgical History:  Procedure Laterality Date   AMPUTATION Left 12/20/2019   Procedure: AMPUTATION 5TH TOE;  Surgeon: Erle Crocker, MD;  Location: [facility];  Service: Orthopedics;  Laterality: Left;   BUBBLE STUDY  09/22/2020   Procedure: BUBBLE STUDY;  Surgeon: Donato Heinz, MD;  Location: [facility];  Service: Cardiovascular;;   CATARACT EXTRACTION Bilateral 02/2021   LOOP RECORDER INSERTION N/A 09/22/2020   Procedure: LOOP RECORDER INSERTION;  Surgeon: Constance Haw, MD;  Location: [facility];  Service: Cardiovascular;  Laterality: N/A;   TEE WITHOUT CARDIOVERSION N/A 09/22/2020   Procedure: TRANSESOPHAGEAL ECHOCARDIOGRAM (TEE);  Surgeon: Donato Heinz, MD;  Location: [facility];  Service: Cardiovascular;  Laterality: N/A;  LOOP    ROS: Review of Systems Negative except as stated above  PHYSICAL EXAM: BP (!) 217/95   Pulse 81   Wt 153 lb 12.8 oz (69.8 kg)   SpO2 99%   BMI 23.39 kg/m   Wt Readings from Last 3 Encounters:  06/14/22 153 lb 12.8 oz (69.8 kg)  05/03/22 154 lb 3.2 oz (69.9 kg)  12/13/21 153 lb (69.4 kg)   BP 210/110 Physical Exam   General appearance - alert, well appearing, and in no distress Mental status -patient answers questions appropriately.  He is a bit forgetful. Chest - clear to auscultation, no wheezes, rales or rhonchi, symmetric air  entry Heart - normal rate, regular rhythm, normal S1, S2, no murmurs, rubs, clicks or gallops Extremities -1+ bilateral lower extremity edema     Latest Ref Rng & Units 05/03/2022    3:42 PM 12/13/2021    2:27 PM 09/21/2021   10:51 AM  CMP  Glucose 70 - 99 mg/dL 86  102  131   BUN 8 - 27 mg/dL 29  35  29   Creatinine 0.76 - 1.27 mg/dL 3.98  2.81  2.94   Sodium 134 - 144 mmol/L 138  139  139   Potassium 3.5 - 5.2 mmol/L 5.0  5.8  5.2   Chloride 96 - 106 mmol/L 109  112  111   CO2 20 - 29 mmol/L 19  19  20    Calcium 8.6 - 10.2 mg/dL 8.0  8.5  8.7   Total Protein 6.0 - 8.5 g/dL 5.3     Total Bilirubin 0.0 - 1.2 mg/dL <0.2     Alkaline Phos 44 - 121 IU/L 129     AST 0 - 40 IU/L 10     ALT 0 - 44 IU/L 13      Lipid Panel     Component Value Date/Time   CHOL 395 (H) 05/03/2022 1542   TRIG 168 (H) 05/03/2022 1542   HDL 78 05/03/2022 1542   CHOLHDL 5.1 (H) 05/03/2022 1542   CHOLHDL 4.4 12/21/2019 0123   VLDL 23 12/21/2019 0123   LDLCALC 284 (H) 05/03/2022 1542    CBC    Component Value Date/Time   WBC 4.2 09/06/2020 1327   WBC 5.6 02/18/2020 1355   RBC 4.08 (L) 09/06/2020 1327   RBC 3.99 (L) 02/18/2020 1355   HGB 11.1 (L) 09/06/2020 1327   HCT 34.6 (L) 09/06/2020 1327   PLT 164 09/06/2020 1327   MCV 85 09/06/2020 1327   MCH 27.2 09/06/2020 1327   MCH 27.3 02/18/2020 1355   MCHC 32.1 09/06/2020 1327   MCHC 33.2 02/18/2020 1355   RDW 14.2 09/06/2020 1327   LYMPHSABS 1.3 12/22/2019 0424   MONOABS 0.5 12/22/2019 0424   EOSABS 0.2 12/22/2019 0424   BASOSABS 0.0 12/22/2019 0424    ASSESSMENT AND PLAN: 1. Hypertension associated with stage 4 chronic kidney disease due to type 2 diabetes mellitus (HCC) -It is difficult to know whether patient is really taking his medications or not.  I suspect he has memory issues.  I have told him repeatedly to bring his medications to every visit for [place] to verify what he is taking.  Not able to make any adjustments today because of this.  I  am not sure whether he stopped the chlorthalidone and got the furosemide instead as prescribed on last visit.  Will have him follow-up with the clinical pharmacist in about 1 week.  Advised that he bring his medicines with him.  2. PAD (peripheral artery disease) (HCC) Stable.  Continue aspirin and statin  3. CKD (chronic kidney disease) stage 4, GFR 15-29 ml/min (HCC) Patient to keep upcoming appointment with nephrology next week.    Patient was given the opportunity to ask questions.  Patient verbalized understanding of the plan and was able to repeat key elements of the plan.   This documentation was completed using Radio producer.  Any transcriptional errors are unintentional.  No orders of the defined types were placed in this encounter.    Requested Prescriptions    No prescriptions requested or ordered in this encounter    No follow-ups on file.  Karle Plumber, MD, FACP

## 2022-06-14 NOTE — Patient Instructions (Signed)
Please bring your medications with you when you come next week to see the clinical pharmacist. Stop eating salted foods.

## 2022-06-18 ENCOUNTER — Other Ambulatory Visit (HOSPITAL_COMMUNITY): Payer: Self-pay | Admitting: Physician Assistant

## 2022-06-18 ENCOUNTER — Other Ambulatory Visit: Payer: Self-pay | Admitting: Radiology

## 2022-06-19 ENCOUNTER — Other Ambulatory Visit: Payer: Self-pay

## 2022-06-19 ENCOUNTER — Ambulatory Visit (HOSPITAL_COMMUNITY)
Admission: RE | Admit: 2022-06-19 | Discharge: 2022-06-19 | Disposition: A | Discharge status: Home / Self Care | Payer: 59 | Source: Ambulatory Visit | Attending: Nephrology | Admitting: Nephrology

## 2022-06-19 DIAGNOSIS — N179 Acute kidney failure, unspecified: Secondary | ICD-10-CM | POA: Insufficient documentation

## 2022-06-19 LAB — PROTIME-INR
INR: 1 (ref 0.8–1.2)
Prothrombin Time: 13.1 seconds (ref 11.4–15.2)

## 2022-06-19 LAB — CBC
HCT: 34.5 % — ABNORMAL LOW (ref 39.0–52.0)
Hemoglobin: 10.9 g/dL — ABNORMAL LOW (ref 13.0–17.0)
MCH: 27.5 pg (ref 26.0–34.0)
MCHC: 31.6 g/dL (ref 30.0–36.0)
MCV: 86.9 fL (ref 80.0–100.0)
Platelets: 139 10*3/uL — ABNORMAL LOW (ref 150–400)
RBC: 3.97 MIL/uL — ABNORMAL LOW (ref 4.22–5.81)
RDW: 13.7 % (ref 11.5–15.5)
WBC: 4.5 10*3/uL (ref 4.0–10.5)
nRBC: 0 % (ref 0.0–0.2)

## 2022-06-19 LAB — GLUCOSE, CAPILLARY: Glucose-Capillary: 95 mg/dL (ref 70–99)

## 2022-06-19 MED ORDER — CARVEDILOL 12.5 MG PO TABS
6.2500 mg | ORAL_TABLET | Freq: Once | ORAL | Status: AC
  Administered 2022-06-19: 6.25 mg via ORAL
  Filled 2022-06-19: qty 1

## 2022-06-19 MED ORDER — LOSARTAN POTASSIUM 50 MG PO TABS
50.0000 mg | ORAL_TABLET | Freq: Once | ORAL | Status: AC
  Administered 2022-06-19: 50 mg via ORAL
  Filled 2022-06-19: qty 1

## 2022-06-19 MED ORDER — HYDRALAZINE HCL 50 MG PO TABS
50.0000 mg | ORAL_TABLET | Freq: Once | ORAL | Status: AC
  Administered 2022-06-19: 50 mg via ORAL
  Filled 2022-06-19 (×2): qty 1

## 2022-06-19 MED ORDER — GELATIN ABSORBABLE 12-7 MM EX MISC
CUTANEOUS | Status: AC
  Filled 2022-06-19: qty 1

## 2022-06-19 MED ORDER — SODIUM CHLORIDE 0.9 % IV SOLN: INTRAVENOUS | Status: DC

## 2022-06-19 MED ORDER — LIDOCAINE HCL (PF) 1 % IJ SOLN
INTRAMUSCULAR | Status: AC
  Filled 2022-06-19: qty 30

## 2022-06-19 MED ORDER — AMLODIPINE BESYLATE 5 MG PO TABS
5.0000 mg | ORAL_TABLET | Freq: Once | ORAL | Status: AC
  Administered 2022-06-19: 5 mg via ORAL
  Filled 2022-06-19: qty 1

## 2022-06-19 NOTE — Progress Notes (Signed)
Patients B/P was 194/93. Patient stated that he was instructed not to take his medications prior to procedure and has not taken his home meds since the past week. Roselyn Reef, NP made aware and stated that she will order for him to have a dose of his home medications now, but if his B/P does not return to normal he will have to reschedule.

## 2022-06-19 NOTE — Progress Notes (Signed)
After giving ordered B/P medications, B/P is 184/91. Roselyn Reef, NP updated and stated that if patients B/P is not below 924'Q systolic at 6834 the patient will be rescheduled. Patient updated of this information and verbalized understanding. Will re-check B/P at 0830.

## 2022-06-19 NOTE — Progress Notes (Signed)
Re-checked B/P at 0830 and it was 178/89. Roselyn Reef, NP notified and stated that she would check with the MD and be up to speak with the patient.

## 2022-06-19 NOTE — Progress Notes (Signed)
Patient arrived to [facility] today for an image-guided non-focal renal biopsy. The patient states he has not taken any of his blood pressure medications for approximately one week. He takes Amlodipine, Carvedilol, Losartan and Hydralazine. His blood pressure on arrival showed a systolic of 272. All of his home BP meds were ordered and administered in Short Stay. He remains too hypertensive to safely perform this procedure. Last BP was 178/89. After a discussion with IR MD, decision made to reschedule this procedure.   Patient seen in Short Stay and this information was discussed. The patient will be rescheduled. The patient is aware to take ALL his home blood pressure medications, including the morning of the procedure with the exception of 81 mg aspirin. Aspirin needs to be held for 5 days prior to the procedure. These instructions were also written down and given to the patient. The patient has a blood pressure cuff at home and I advised him to keep an eye on his blood pressure in the days leading up to his procedure. He knows his blood pressure needs to be below 536 systolic and ideally less than 644 systolic for our team to safely perform this procedure. Our schedulers have been notified to get this patient rescheduled as soon as possible.   Soyla Dryer, [place] [phone] 06/19/2022, 8:56 AM

## 2022-06-22 ENCOUNTER — Encounter: Payer: Self-pay | Admitting: Internal Medicine

## 2022-06-24 ENCOUNTER — Telehealth: Payer: Self-pay | Admitting: Internal Medicine

## 2022-06-26 NOTE — Telephone Encounter (Signed)
Call placed to patient : [phone] , message left with call back requested

## 2022-07-01 ENCOUNTER — Ambulatory Visit (HOSPITAL_COMMUNITY): Payer: 59

## 2022-07-02 ENCOUNTER — Other Ambulatory Visit: Payer: Self-pay | Admitting: Student

## 2022-07-02 ENCOUNTER — Other Ambulatory Visit: Payer: Self-pay | Admitting: Internal Medicine

## 2022-07-02 DIAGNOSIS — N189 Chronic kidney disease, unspecified: Secondary | ICD-10-CM

## 2022-07-02 NOTE — Telephone Encounter (Signed)
I tried to reach the patient again : [phone] , message left with call back requested.  This is also the phone number provided by his daughter, Delia Heady.

## 2022-07-02 NOTE — H&P (Signed)
Chief Complaint: Patient was seen in consultation today for chronic kidney disease; non-focal renal biopsy  Referring Physician(s): Singh,Vikas  Supervising Physician: {Supervising Physician:[id number]}  Patient Status: [facility] - Out-pt  History of Present Illness: Douglas Briggs is a 62 y.o. male with a medical history significant for HTN, DM2, CVA, carotid stenosis, diabetic retinopathy/neuropathy and chronic kidney disease. He was referred to [facility] in 2022 for rising creatinine levels. Nephrology work up also revealed proteinuria but additional labs/imaging was unrevealing. Labs obtained in December 2023 showed worsening kidney function.   Interventional Radiology has been asked to evaluate this patient for an image-guided non-focal renal biopsy. Of note, this patient presented to the [facility] IR department on 06/19/22 but was too hypertensive to safely perform the procedure. He had not taken any of his blood pressure medications for one week.   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 Dr. Tacy Dura - seen 11/24/19   Protein-calorie malnutrition, mild ([place]) 09/11/2020   Restless leg syndrome 11/14/2016   Right sided numbness    Stroke ([place])    Stroke-like  symptoms 10/29/2019   TIA (transient ischemic attack) 10/29/2019   Tinea pedis of both feet 03/12/2017   Ulcer of left lower extremity with fat layer exposed ([place]) 01/20/2020   Uncontrolled type 2 diabetes mellitus with diabetic polyneuropathy, without long-term current use of insulin 11/12/2016    Past Surgical History:  Procedure Laterality Date   AMPUTATION Left 12/20/2019   Procedure: AMPUTATION 5TH TOE;  Surgeon: Erle Crocker, MD;  Location: [facility];  Service: Orthopedics;  Laterality: Left;   BUBBLE STUDY  09/22/2020   Procedure: BUBBLE STUDY;  Surgeon: Donato Heinz, MD;  Location: [place];  Service: Cardiovascular;;   CATARACT EXTRACTION Bilateral 02/2021   LOOP RECORDER INSERTION N/A 09/22/2020   Procedure: LOOP RECORDER INSERTION;  Surgeon: Constance Haw, MD;  Location: [place] CV [facility];  Service: Cardiovascular;  Laterality: N/A;   TEE WITHOUT CARDIOVERSION N/A 09/22/2020   Procedure: TRANSESOPHAGEAL ECHOCARDIOGRAM (TEE);  Surgeon: Donato Heinz, MD;  Location: [facility] ENDOSCOPY;  Service: Cardiovascular;  Laterality: N/A;  LOOP    Allergies: Patient has no known allergies.  Medications: Prior to Admission medications   Medication Sig Start Date End Date Taking? Authorizing Provider  amLODipine (NORVASC) 5 MG tablet Take 1 tablet (5 mg total) by mouth daily. 09/21/21   Ladell Pier, MD  aspirin EC 81 MG tablet Take 81 mg by mouth daily. Swallow whole.    [provider]  atorvastatin (LIPITOR) 40 MG tablet Take 2 tablets (80 mg total) by mouth daily in the afternoon. 08/22/21   Ladell Pier, MD  carvedilol (COREG) 6.25 MG tablet Take 1 tablet (6.25 mg total) by mouth 2 (two) times daily with a meal.  08/30/21   Ladell Pier, MD  ezetimibe (ZETIA) 10 MG tablet Take 10 mg by mouth daily.    [provider]  famotidine (PEPCID) 20 MG tablet Take 20 mg by mouth 2 (two) times daily as needed for heartburn or indigestion.     [provider]  furosemide (LASIX) 40 MG tablet Take 0.5 tablets (20 mg total) by mouth daily. Stop chlorthalidone 05/03/22   Ladell Pier, MD  hydrALAZINE (APRESOLINE) 50 MG tablet Take 1 tablet (50 mg total) by mouth 3 (three) times daily. STOP Clonidine 08/30/21   Ladell Pier, MD  ketorolac (ACULAR) 0.5 % ophthalmic solution SMARTSIG:In Eye(s) 03/21/21   [provider]  losartan (COZAAR) 50 MG tablet Take 1 tablet (50 mg total) by mouth daily. 08/30/21   Ladell Pier, MD  prednisoLONE acetate (PRED FORTE) 1 % ophthalmic suspension  03/21/21   [provider]  sodium zirconium cyclosilicate (LOKELMA) 5 g packet Take 5 g by mouth daily. Mix 1 packet with 3 tablespoons of water and take p.o. 12/14/21   Ladell Pier, MD     Family History  Problem Relation Age of Onset   Hypertension Father    Cancer Father    Diabetes Paternal Aunt    Heart disease Neg Hx     Social History   Socioeconomic History   Marital status: Single    Spouse name: Not on file   Number of children: Not on file   Years of education: Not on file   Highest education level: Not on file  Occupational History   Not on file  Tobacco Use   Smoking status: Never   Smokeless tobacco: Never  Vaping Use   Vaping Use: Never used  Substance and Sexual Activity   Alcohol use: No   Drug use: Never   Sexual activity: Not on file  Other Topics Concern   Not on file  Social History Narrative   Not on file   Social Determinants of Health   Financial Resource Strain: Not on file  Food Insecurity: Not on file  Transportation Needs: Not on file  Physical Activity: Not on file  Stress: Not on file  Social Connections: Not on file    Review of Systems: A 12 point ROS discussed and pertinent positives are indicated in the HPI above.  All other systems are negative.  Review of Systems  Vital Signs: There were no vitals taken for this visit.  Physical  Exam  Imaging: CUP PACEART REMOTE DEVICE CHECK  Result Date: 06/11/2022 ILR summary report received. Battery status OK. Normal device function. No new symptom, tachy, brady, or pause episodes. No new AF episodes. Monthly summary reports and ROV/PRN No future schedule - route to [facility]   Labs:  CBC: Recent Labs    06/19/22 0644  WBC 4.5  HGB 10.9*  HCT 34.5*  PLT 139*    COAGS: Recent Labs    06/19/22 0644  INR 1.0    BMP: Recent Labs    09/21/21 1051 12/13/21 1427 05/03/22 1542  NA 139 139 138  K 5.2 5.8* 5.0  CL 111* 112* 109*  CO2 20 19* 19*  GLUCOSE 131* 102* 86  BUN 29* 35* 29*  CALCIUM 8.7 8.5* 8.0*  CREATININE 2.94* 2.81* 3.98*    LIVER FUNCTION TESTS: Recent Labs    05/03/22 1542  BILITOT <0.2  AST 10  ALT 13  ALKPHOS 129*  PROT 5.3*  ALBUMIN 2.5*  TUMOR MARKERS: No results for input(s): "AFPTM", "CEA", "CA199", "CHROMGRNA" in the last 8760 hours.  Assessment and Plan:  Chronic kidney disease with rising creatinine levels; nephrotic range proteinuria: Douglas Briggs, 62 year old male, presents today to the [place] department for an image-guided non-focal renal biopsy.  Risks and benefits of this procedure were discussed with the patient and/or patient's family including, but not limited to bleeding, infection, damage to adjacent structures or low yield requiring additional tests.  All of the questions were answered and there is agreement to proceed. He has been NPO. His last dose of 81 mg aspirin was ***  Consent signed and in chart.  Thank you for this interesting consult.  I greatly enjoyed meeting Douglas Briggs and look forward to participating in their care.  A copy of this report was sent to the requesting provider on this date.  Electronically Signed: Soyla Dryer, AGACNP-BC [phone] 07/02/2022, 1:49 PM   I spent a total of  30 Minutes   in face to face in clinical consultation, greater than 50% of  which was counseling/coordinating care for non-focal renal biopsy

## 2022-07-03 ENCOUNTER — Ambulatory Visit (HOSPITAL_COMMUNITY)
Admission: RE | Admit: 2022-07-03 | Discharge: 2022-07-03 | Disposition: A | Discharge status: Home / Self Care | Payer: 59 | Source: Ambulatory Visit | Attending: Nephrology | Admitting: Nephrology

## 2022-07-03 ENCOUNTER — Other Ambulatory Visit: Payer: Self-pay

## 2022-07-03 DIAGNOSIS — Z8673 Personal history of transient ischemic attack (TIA), and cerebral infarction without residual deficits: Secondary | ICD-10-CM | POA: Insufficient documentation

## 2022-07-03 DIAGNOSIS — E1122 Type 2 diabetes mellitus with diabetic chronic kidney disease: Secondary | ICD-10-CM | POA: Diagnosis not present

## 2022-07-03 DIAGNOSIS — N179 Acute kidney failure, unspecified: Secondary | ICD-10-CM | POA: Diagnosis not present

## 2022-07-03 DIAGNOSIS — E11319 Type 2 diabetes mellitus with unspecified diabetic retinopathy without macular edema: Secondary | ICD-10-CM | POA: Insufficient documentation

## 2022-07-03 DIAGNOSIS — N189 Chronic kidney disease, unspecified: Secondary | ICD-10-CM | POA: Diagnosis not present

## 2022-07-03 DIAGNOSIS — I129 Hypertensive chronic kidney disease with stage 1 through stage 4 chronic kidney disease, or unspecified chronic kidney disease: Secondary | ICD-10-CM | POA: Insufficient documentation

## 2022-07-03 DIAGNOSIS — E1140 Type 2 diabetes mellitus with diabetic neuropathy, unspecified: Secondary | ICD-10-CM | POA: Insufficient documentation

## 2022-07-03 DIAGNOSIS — R809 Proteinuria, unspecified: Secondary | ICD-10-CM | POA: Insufficient documentation

## 2022-07-03 LAB — CBC
HCT: 32.9 % — ABNORMAL LOW (ref 39.0–52.0)
Hemoglobin: 10.9 g/dL — ABNORMAL LOW (ref 13.0–17.0)
MCH: 28.2 pg (ref 26.0–34.0)
MCHC: 33.1 g/dL (ref 30.0–36.0)
MCV: 85.2 fL (ref 80.0–100.0)
Platelets: 158 K/uL (ref 150–400)
RBC: 3.86 MIL/uL — ABNORMAL LOW (ref 4.22–5.81)
RDW: 13.9 % (ref 11.5–15.5)
WBC: 5.4 K/uL (ref 4.0–10.5)
nRBC: 0 % (ref 0.0–0.2)

## 2022-07-03 LAB — GLUCOSE, CAPILLARY: Glucose-Capillary: 92 mg/dL (ref 70–99)

## 2022-07-03 LAB — PROTIME-INR
INR: 0.9 (ref 0.8–1.2)
Prothrombin Time: 12.1 s (ref 11.4–15.2)

## 2022-07-03 MED ORDER — SODIUM CHLORIDE 0.9 % IV SOLN: INTRAVENOUS | Status: DC

## 2022-07-03 MED ORDER — MIDAZOLAM HCL 2 MG/2ML IJ SOLN
INTRAMUSCULAR | Status: AC
  Filled 2022-07-03: qty 2

## 2022-07-03 MED ORDER — LIDOCAINE HCL (PF) 2 % IJ SOLN
8.0000 mL | Freq: Once | INTRAMUSCULAR | Status: AC
  Administered 2022-07-03: 8 mL via INTRADERMAL
  Filled 2022-07-03: qty 8

## 2022-07-03 MED ORDER — MIDAZOLAM HCL 2 MG/2ML IJ SOLN
INTRAMUSCULAR | Status: AC | PRN
  Administered 2022-07-03: 1 mg via INTRAVENOUS

## 2022-07-03 MED ORDER — FENTANYL CITRATE (PF) 100 MCG/2ML IJ SOLN
INTRAMUSCULAR | Status: AC
  Filled 2022-07-03: qty 2

## 2022-07-03 MED ORDER — FENTANYL CITRATE (PF) 100 MCG/2ML IJ SOLN
INTRAMUSCULAR | Status: AC | PRN
  Administered 2022-07-03: 25 ug via INTRAVENOUS

## 2022-07-03 NOTE — Progress Notes (Signed)
Patient and daughter was given discharge instructions. Both verbalized understanding. 

## 2022-07-03 NOTE — Procedures (Signed)
Pre Procedure Dx: Proteinuria Post Procedural Dx: Same  Technically successful US guided biopsy of inferior pole of the left kidney.  EBL: Trace No immediate complications.   Jay Yilin Weedon, MD Pager #: [phone]   

## 2022-07-04 NOTE — Telephone Encounter (Signed)
I was able to reach patient's daughter, Douglas Briggs.  She explained that her father has low health literacy and she is concerned about how he is managing  his medications- is he taking the correct meds? Correct doses? I explained to her that we do not have a release from the patient to speak with her.  She said she is in [place] and will need to make sure that we get that documentation.  She said that she had provided it to this office in the past.   She is requesting an appointment for her father with Dr Wynetta Emery.   I explained that Dr Wynetta Emery cannot do 2 calls, she needs to present with her father for the call/visit and [place] said she would be there. I offered her an in-person appointment tomorrow morning,07/05/2022  at 1050 but she said she could not make that.  She said she could be with her father for a virtual visit next week and I scheduled him for a MyChart visit with Dr Wynetta Emery on 07/08/2022 @ 0810. She said that would work

## 2022-07-08 ENCOUNTER — Ambulatory Visit: Payer: 59 | Attending: Internal Medicine | Admitting: Internal Medicine

## 2022-07-08 ENCOUNTER — Encounter (HOSPITAL_COMMUNITY): Payer: Self-pay

## 2022-07-08 DIAGNOSIS — Z538 Procedure and treatment not carried out for other reasons: Secondary | ICD-10-CM

## 2022-07-08 NOTE — Progress Notes (Signed)
This was a scheduled video visit for 8:10 a.m. After pt did not sign on, I called and left VMM of who I am and that I was waiting for him to sign on.  I then saw a Mychart message that was sent at 7:25 a.m stating appt he needs to reschedule today's appt.

## 2022-07-09 DIAGNOSIS — R809 Proteinuria, unspecified: Secondary | ICD-10-CM | POA: Diagnosis not present

## 2022-07-09 DIAGNOSIS — N2581 Secondary hyperparathyroidism of renal origin: Secondary | ICD-10-CM | POA: Diagnosis not present

## 2022-07-09 DIAGNOSIS — E875 Hyperkalemia: Secondary | ICD-10-CM | POA: Diagnosis not present

## 2022-07-09 DIAGNOSIS — I129 Hypertensive chronic kidney disease with stage 1 through stage 4 chronic kidney disease, or unspecified chronic kidney disease: Secondary | ICD-10-CM | POA: Diagnosis not present

## 2022-07-09 DIAGNOSIS — D631 Anemia in chronic kidney disease: Secondary | ICD-10-CM | POA: Diagnosis not present

## 2022-07-09 DIAGNOSIS — E559 Vitamin D deficiency, unspecified: Secondary | ICD-10-CM | POA: Diagnosis not present

## 2022-07-09 DIAGNOSIS — N1832 Chronic kidney disease, stage 3b: Secondary | ICD-10-CM | POA: Diagnosis not present

## 2022-07-09 DIAGNOSIS — E872 Acidosis, unspecified: Secondary | ICD-10-CM | POA: Diagnosis not present

## 2022-07-09 LAB — SURGICAL PATHOLOGY

## 2022-07-11 ENCOUNTER — Encounter (HOSPITAL_COMMUNITY): Payer: 59

## 2022-07-11 DIAGNOSIS — I639 Cerebral infarction, unspecified: Secondary | ICD-10-CM

## 2022-07-11 LAB — CUP PACEART REMOTE DEVICE CHECK
Date Time Interrogation Session: 20240214231013
Implantable Pulse Generator Implant Date: 20220429

## 2022-07-17 ENCOUNTER — Encounter: Payer: Self-pay | Admitting: Internal Medicine

## 2022-07-18 ENCOUNTER — Other Ambulatory Visit (HOSPITAL_COMMUNITY): Payer: Self-pay

## 2022-07-18 NOTE — Progress Notes (Signed)
Carelink Summary Report / Loop Recorder 

## 2022-07-19 ENCOUNTER — Ambulatory Visit: Payer: 59 | Attending: Internal Medicine | Admitting: Pharmacist

## 2022-07-19 VITALS — BP 187/87 | HR 74

## 2022-07-19 DIAGNOSIS — I10 Essential (primary) hypertension: Secondary | ICD-10-CM

## 2022-07-19 MED ORDER — HYDRALAZINE HCL 50 MG PO TABS: 50.0000 mg | ORAL_TABLET | Freq: Three times a day (TID) | ORAL | 1 refills | Status: DC

## 2022-07-19 MED ORDER — FUROSEMIDE 20 MG PO TABS: 20.0000 mg | ORAL_TABLET | Freq: Every day | ORAL | 1 refills | Status: DC

## 2022-07-19 MED ORDER — CARVEDILOL 6.25 MG PO TABS: 6.2500 mg | ORAL_TABLET | Freq: Two times a day (BID) | ORAL | 1 refills | Status: DC

## 2022-07-19 MED ORDER — LOSARTAN POTASSIUM 50 MG PO TABS: 50.0000 mg | ORAL_TABLET | Freq: Every day | ORAL | 1 refills | Status: DC

## 2022-07-19 NOTE — Progress Notes (Signed)
S:    Patient arrives in good spirits. Presents to the clinic for hypertension evaluation, counseling, and management. Patient was referred and last seen by Primary Care Provider on 06/14/2022. BP was elevated at that visit and he was referred to me for BP check. See blow for medication reconciliation - this patient has known adherence trouble.   Today, he brings his bottles with him. Several bottles have fill dates that indicate he should have ran out a while ago. Some bottles are so old that the ink on the rx bottle has completely rubbed off. A complete summary is as follows:   -Losartan: pt is supposed to be on 50 mg daily. Today, he has a 90 day supply of losartan '25mg'$  that was filled 04/04/2020. This has instructions to take 2 tablets ('50mg'$  total) daily.  -Carvedilol: 6.25 mg BID. Ink rubbed off of label. There are still tablets in this bottle.  -Chlorthalidone: not supposed to be taking but has 3 bottles of '25mg'$  tablets with the instructions to take 1 tablet ('25mg'$  total) daily. This was stopped by Dr. Wynetta Emery on 06/14/22 but he claims to be taking.  -Furosemide: appears up to date.  -Amlodipine: appears up to date. -Hydralazine: has an old bottle filled 08/2020 for 100 mg TID. Patient tells me he is taking 1/2 of a tablet TID.  Medication adherence denied, see above. I asked him about the fact that some of these bottles have 10-12 year-old fill dates on them. He admits that he doesn't take medications consistently. I wonder if he's taking them at all.  Current BP Medications include: amlodipine 5 mg daily, carvedilol 6.25 mg BID,  hydralazine 50 mg TID, losartan 50 mg daily  **taking furosemide 20 mg daily for fluid control.   Dietary habits include: reports compliance with salt restriction but admits to eating things like [place] wings that he prepares himself. He denies intake of caffeine.  Exercise habits include: "not like I used to" - works in the yard, walks for a couple hours a  week Family / Social history:  Fhx: HTN, DM Tobacco: never smoker  Alcohol: denies use   Home blood pressure:  -170s/80s  O:  Vitals:   07/19/22 1440  BP: (!) 187/87  Pulse: 74   Last 3 Office BP readings: BP Readings from Last 3 Encounters:  07/19/22 (!) 187/87  07/03/22 (!) 163/89  06/19/22 (!) 178/89    BMET    Component Value Date/Time   NA 138 05/03/2022 1542   K 5.0 05/03/2022 1542   CL 109 (H) 05/03/2022 1542   CO2 19 (L) 05/03/2022 1542   GLUCOSE 86 05/03/2022 1542   GLUCOSE 157 (H) 02/18/2020 1355   BUN 29 (H) 05/03/2022 1542   CREATININE 3.98 (H) 05/03/2022 1542   CALCIUM 8.0 (L) 05/03/2022 1542   GFRNONAA 48 (L) 04/14/2020 1543   GFRAA 55 (L) 04/14/2020 1543    Renal function: CrCl cannot be calculated (Patient's most recent lab result is older than the maximum 21 days allowed.).  Clinical ASCVD: Yes  The ASCVD Risk score (Arnett DK, et al., 2019) failed to calculate for the following reasons:   The patient has a prior MI or stroke diagnosis    A/P: Hypertension diagnosed currently above goal but better on current medications. BP Goal = < 130/80 mmHg. Medication adherence: see above. He has a history of poor adherence.   We discussed methods to improve adherence. I suggested our pharmacy or [facility] for adherence packaging, however, his  residence is in [place].  [facility]. I [facility] with the exception of amlodipine. He seems to be taking this consistently. I confiscated his old bottles and disposed of them. [facility] after this appt. He will return in 1 month for reassessment.   -Continue same regimen. No changes, encouraged adherence.  -New rxs sent for antihypertensives.  -Counseled on lifestyle modifications for blood pressure control including reduced dietary sodium, increased exercise, adequate sleep.  Results reviewed and written  information provided.   Total time in face-to-face counseling 30 minutes.   F/U Clinic Visit in 1 month with PCP.  Benard Halsted, PharmD, [place], [place] [phone]

## 2022-07-22 ENCOUNTER — Ambulatory Visit (HOSPITAL_COMMUNITY)
Admission: RE | Admit: 2022-07-22 | Discharge: 2022-07-22 | Disposition: A | Discharge status: Home / Self Care | Payer: 59 | Source: Ambulatory Visit | Attending: Nephrology | Admitting: Nephrology

## 2022-07-22 DIAGNOSIS — N189 Chronic kidney disease, unspecified: Secondary | ICD-10-CM | POA: Diagnosis not present

## 2022-07-22 DIAGNOSIS — D631 Anemia in chronic kidney disease: Secondary | ICD-10-CM | POA: Diagnosis not present

## 2022-07-22 MED ORDER — SODIUM CHLORIDE 0.9 % IV SOLN
200.0000 mg | INTRAVENOUS | Status: DC
  Administered 2022-07-22: 200 mg via INTRAVENOUS
  Filled 2022-07-22: qty 200

## 2022-07-24 ENCOUNTER — Encounter: Payer: Self-pay | Admitting: Internal Medicine

## 2022-07-24 NOTE — Progress Notes (Signed)
Patient seen by nephrologist Dr. Candiss Norse on 07/09/2022. 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  Off Losartan in efforts to preserve GFR. Discussion started about renal replacement.  Patient favoring home therapies/PD.  Will arrange for home visit/training.  Discussed transplant which she is agreeable to.  [facility]. -Blood pressure uncontrolled.  Amlodipine increased to 10 mg. Hyperkalemia: On Lokelma 10 g once a day twice a week Possibly will be starting bicarb supplement.  Addendum: I placed a phone call to Dr. Keturah Barre office today.  I left a message on the voicemail of his medical assistant.  I stated that I wanted to clarify that he does want the patient to be off Cozaar and if so please give me a call back.

## 2022-07-29 ENCOUNTER — Encounter (HOSPITAL_COMMUNITY)
Admission: RE | Admit: 2022-07-29 | Discharge: 2022-07-29 | Disposition: A | Discharge status: Home / Self Care | Payer: 59 | Source: Ambulatory Visit | Attending: Nephrology | Admitting: Nephrology

## 2022-07-29 DIAGNOSIS — D631 Anemia in chronic kidney disease: Secondary | ICD-10-CM | POA: Diagnosis not present

## 2022-07-29 DIAGNOSIS — Z79899 Other long term (current) drug therapy: Secondary | ICD-10-CM | POA: Diagnosis not present

## 2022-07-29 DIAGNOSIS — F17200 Nicotine dependence, unspecified, uncomplicated: Secondary | ICD-10-CM | POA: Diagnosis not present

## 2022-07-29 DIAGNOSIS — N185 Chronic kidney disease, stage 5: Secondary | ICD-10-CM | POA: Diagnosis not present

## 2022-07-29 MED ORDER — SODIUM CHLORIDE 0.9 % IV SOLN
200.0000 mg | INTRAVENOUS | Status: DC
  Administered 2022-07-29: 200 mg via INTRAVENOUS
  Filled 2022-07-29: qty 200

## 2022-08-05 ENCOUNTER — Encounter (HOSPITAL_COMMUNITY)
Admission: RE | Admit: 2022-08-05 | Discharge: 2022-08-05 | Disposition: A | Discharge status: Home / Self Care | Payer: 59 | Source: Ambulatory Visit | Attending: Nephrology | Admitting: Nephrology

## 2022-08-05 DIAGNOSIS — Z79899 Other long term (current) drug therapy: Secondary | ICD-10-CM | POA: Diagnosis not present

## 2022-08-05 DIAGNOSIS — N185 Chronic kidney disease, stage 5: Secondary | ICD-10-CM | POA: Diagnosis not present

## 2022-08-05 DIAGNOSIS — D631 Anemia in chronic kidney disease: Secondary | ICD-10-CM | POA: Diagnosis not present

## 2022-08-05 DIAGNOSIS — F17200 Nicotine dependence, unspecified, uncomplicated: Secondary | ICD-10-CM | POA: Diagnosis not present

## 2022-08-05 MED ORDER — SODIUM CHLORIDE 0.9 % IV SOLN
200.0000 mg | INTRAVENOUS | Status: DC
  Administered 2022-08-05: 200 mg via INTRAVENOUS
  Filled 2022-08-05: qty 200

## 2022-08-07 NOTE — Progress Notes (Signed)
Carelink Summary Report / Loop Recorder 

## 2022-08-08 ENCOUNTER — Telehealth: Payer: Self-pay | Admitting: Internal Medicine

## 2022-08-08 NOTE — Telephone Encounter (Signed)
Called patient to schedule Medicare Annual Wellness Visit (AWV). Left message for patient to call back and schedule Medicare Annual Wellness Visit (AWV).  Last date of AWV: WTM 08/03/21   If any questions, please contact me at [phone].  Thank you ,  Barkley Boards AWV direct phone # [phone]

## 2022-08-09 DIAGNOSIS — E872 Acidosis, unspecified: Secondary | ICD-10-CM | POA: Diagnosis not present

## 2022-08-09 DIAGNOSIS — I129 Hypertensive chronic kidney disease with stage 1 through stage 4 chronic kidney disease, or unspecified chronic kidney disease: Secondary | ICD-10-CM | POA: Diagnosis not present

## 2022-08-09 DIAGNOSIS — N1832 Chronic kidney disease, stage 3b: Secondary | ICD-10-CM | POA: Diagnosis not present

## 2022-08-09 DIAGNOSIS — N2581 Secondary hyperparathyroidism of renal origin: Secondary | ICD-10-CM | POA: Diagnosis not present

## 2022-08-09 DIAGNOSIS — D631 Anemia in chronic kidney disease: Secondary | ICD-10-CM | POA: Diagnosis not present

## 2022-08-09 DIAGNOSIS — E875 Hyperkalemia: Secondary | ICD-10-CM | POA: Diagnosis not present

## 2022-08-09 DIAGNOSIS — N189 Chronic kidney disease, unspecified: Secondary | ICD-10-CM | POA: Diagnosis not present

## 2022-08-09 LAB — LAB REPORT - SCANNED: EGFR: 10

## 2022-08-13 ENCOUNTER — Encounter (HOSPITAL_COMMUNITY)
Admission: RE | Admit: 2022-08-13 | Discharge: 2022-08-13 | Disposition: A | Discharge status: Home / Self Care | Payer: 59 | Source: Ambulatory Visit | Attending: Nephrology | Admitting: Nephrology

## 2022-08-13 ENCOUNTER — Ambulatory Visit: Payer: 59 | Attending: Internal Medicine | Admitting: Internal Medicine

## 2022-08-13 ENCOUNTER — Encounter: Payer: Self-pay | Admitting: Internal Medicine

## 2022-08-13 ENCOUNTER — Encounter (INDEPENDENT_AMBULATORY_CARE_PROVIDER_SITE_OTHER): Payer: 59

## 2022-08-13 VITALS — BP 200/90 | HR 65 | Temp 97.9°F | Ht 68.0 in | Wt 161.0 lb

## 2022-08-13 DIAGNOSIS — I639 Cerebral infarction, unspecified: Secondary | ICD-10-CM

## 2022-08-13 DIAGNOSIS — I120 Hypertensive chronic kidney disease with stage 5 chronic kidney disease or end stage renal disease: Secondary | ICD-10-CM

## 2022-08-13 DIAGNOSIS — N185 Chronic kidney disease, stage 5: Secondary | ICD-10-CM | POA: Diagnosis not present

## 2022-08-13 DIAGNOSIS — E1122 Type 2 diabetes mellitus with diabetic chronic kidney disease: Secondary | ICD-10-CM | POA: Diagnosis not present

## 2022-08-13 DIAGNOSIS — E113299 Type 2 diabetes mellitus with mild nonproliferative diabetic retinopathy without macular edema, unspecified eye: Secondary | ICD-10-CM | POA: Diagnosis not present

## 2022-08-13 DIAGNOSIS — R413 Other amnesia: Secondary | ICD-10-CM

## 2022-08-13 DIAGNOSIS — Z79899 Other long term (current) drug therapy: Secondary | ICD-10-CM | POA: Diagnosis not present

## 2022-08-13 DIAGNOSIS — F17200 Nicotine dependence, unspecified, uncomplicated: Secondary | ICD-10-CM | POA: Diagnosis not present

## 2022-08-13 DIAGNOSIS — D631 Anemia in chronic kidney disease: Secondary | ICD-10-CM | POA: Diagnosis not present

## 2022-08-13 LAB — POCT GLYCOSYLATED HEMOGLOBIN (HGB A1C): HbA1c, POC (controlled diabetic range): 5.3 % (ref 0.0–7.0)

## 2022-08-13 LAB — GLUCOSE, POCT (MANUAL RESULT ENTRY): POC Glucose: 92 mg/dl (ref 70–99)

## 2022-08-13 MED ORDER — SODIUM CHLORIDE 0.9 % IV SOLN
200.0000 mg | INTRAVENOUS | Status: DC
  Administered 2022-08-13: 200 mg via INTRAVENOUS
  Filled 2022-08-13: qty 10

## 2022-08-13 NOTE — Patient Instructions (Signed)
STOP LOSARTAN

## 2022-08-13 NOTE — Progress Notes (Signed)
Patient ID: Douglas Briggs, male    DOB: February 08, 1961  MRN: [id number]  CC: Hypertension (HTN f/u. /No to flu vax. No to shingles vax. )   Subjective: Douglas Briggs is a 62 y.o. 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  I was expecting his daughter to come with him.  He tells me that she recently started a new job and could not get off.  He did not bring his medications with him today stating that he forgot.  He tells me that he sets up his own medications and does have a medication box that he uses.   Since last visit with me in January, patient has had kidney biopsy.  This revealed changes of diabetic nephrosclerosis and severe interstitial fibrosis with tubular atrophy.  Last GFR was 11 putting him at CKD 5.  Saw nephrologist Dr. Candiss Norse recently.  Talks of began about renal replacement therapy.  Patient is considering peritoneal dialysis.  He was referred to [facility] to be considered for transplant.  Blood pressure is elevated.  Reports compliance with medications.  States that he did not take hydralazine as yet for today because he had to leave the house early this morning to get iron infusion ordered by his nephrologist for anemia of chronic disease. Denies any chest pains, headaches or dizziness. Should be on amlodipine 10 mg daily, carvedilol 6.25 mg twice a day, furosemide 20 mg daily, hydralazine 50 mg 3 times a day.  Lokelma 10 g pack to use twice a week was added on last visit with nephrologist.  According to the nephrologist note, he also wanted the patient off Cozaar.  DM: Results  for orders placed or performed in visit on 08/13/22  POCT glucose (manual entry)  Result Value Ref Range   POC Glucose 92 70 - 99 mg/dl  POCT glycosylated hemoglobin (Hb A1C)  Result Value Ref Range   Hemoglobin A1C     HbA1c POC (<> result, manual entry)     HbA1c, POC (prediabetic range)     HbA1c, POC (controlled diabetic range) 5.3 0.0 - 7.0 %  His diabetes is diet controlled. He sees a retina specialist.  He thinks it is Dr. Radene Ou.  Reports seeing him a few weeks ago and had injection to the left eye.  He has diabetic retinopathy.  Reports that his vision is a bit better.     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   HLD (hyperlipidemia)    Normocytic anemia    CKD (chronic kidney disease)    Essential hypertension 11/22/2019   Atherosclerotic vascular  disease 11/22/2019   Acute CVA (cerebrovascular accident) ([place]) 10/30/2019   TIA (transient ischemic attack) 10/29/2019   Stroke-like symptoms 10/29/2019   Right sided numbness    Acquired hammer toe of left foot 03/12/2017   Tinea pedis of both feet 03/12/2017   Male hypogonadism 11/14/2016   Restless leg syndrome 11/14/2016   Uncontrolled type 2 diabetes mellitus with diabetic polyneuropathy, without long-term current use of insulin 11/12/2016     Current Outpatient Medications on File Prior to Visit  Medication Sig Dispense Refill   amLODipine (NORVASC) 10 MG tablet Take 10 mg by mouth  daily.     aspirin EC 81 MG tablet Take 81 mg by mouth daily. Swallow whole.     atorvastatin (LIPITOR) 40 MG tablet Take 2 tablets (80 mg total) by mouth daily in the afternoon. 180 tablet 1   carvedilol (COREG) 6.25 MG tablet Take 1 tablet (6.25 mg total) by mouth 2 (two) times daily with a meal. 180 tablet 1   ezetimibe (ZETIA) 10 MG tablet Take 10 mg by mouth daily.     famotidine (PEPCID) 20 MG tablet Take 20 mg by mouth 2 (two) times daily as needed for heartburn or indigestion.     furosemide (LASIX) 20 MG tablet Take 1 tablet (20 mg total) by mouth daily. 90 tablet 1   hydrALAZINE (APRESOLINE) 50 MG tablet Take 1 tablet (50 mg total) by mouth 3 (three) times daily. 270 tablet 1   ketorolac (ACULAR) 0.5 % ophthalmic solution SMARTSIG:In Eye(s)     LOKELMA 10 g PACK packet Take by mouth.     losartan (COZAAR) 50 MG tablet Take 1 tablet (50 mg total) by mouth daily. 90 tablet 1   prednisoLONE acetate (PRED FORTE) 1 % ophthalmic suspension      sodium bicarbonate 650 MG tablet Take 1,300 mg by mouth 3 (three) times daily.     No current facility-administered medications on file prior to visit.    No Known Allergies  Social History   Socioeconomic History   Marital status: Single    Spouse name: Not on file   Number of children: Not on file   Years of education: Not on file   Highest education level: Not on file  Occupational History   Not on file  Tobacco Use   Smoking status: Never   Smokeless tobacco: Never  Vaping Use   Vaping Use: Never used  Substance and Sexual Activity   Alcohol use: No   Drug use: Never   Sexual activity: Not on file  Other Topics Concern   Not on file  Social History Narrative   Not on file   Social Determinants of Health   Financial Resource Strain: Not on file  Food Insecurity: Not on file  Transportation Needs: Not on file  Physical Activity: Not on file  Stress: Not on file  Social Connections: Not on file  Intimate Partner  Violence: Not on file    Family History  Problem Relation Age of Onset   Hypertension Father    Cancer Father    Diabetes Paternal Aunt    Heart disease Neg Hx     Past Surgical History:  Procedure Laterality Date   AMPUTATION Left 12/20/2019   Procedure: AMPUTATION 5TH TOE;  Surgeon: Erle Crocker, MD;  Location: [facility];  Service: Orthopedics;  Laterality: Left;   BUBBLE STUDY  09/22/2020   Procedure: BUBBLE STUDY;  Surgeon: Donato Heinz, MD;  Location: [facility];  Service:  Cardiovascular;;   CATARACT EXTRACTION Bilateral 02/2021   LOOP RECORDER INSERTION N/A 09/22/2020   Procedure: LOOP RECORDER INSERTION;  Surgeon: Constance Haw, MD;  Location: [facility];  Service: Cardiovascular;  Laterality: N/A;   TEE WITHOUT CARDIOVERSION N/A 09/22/2020   Procedure: TRANSESOPHAGEAL ECHOCARDIOGRAM (TEE);  Surgeon: Donato Heinz, MD;  Location: [facility] ENDOSCOPY;  Service: Cardiovascular;  Laterality: N/A;  LOOP    ROS: Review of Systems Negative except as stated above  PHYSICAL EXAM: BP (!) 200/90 (BP Location: Left Arm, Patient Position: Sitting, Cuff Size: Small)   Pulse 65   Temp 97.9 F (36.6 C) (Oral)   Ht 5\' 8"  (1.727 m)   Wt 161 lb (73 kg)   SpO2 99%   BMI 24.48 kg/m   Physical Exam   General appearance - alert, well appearing, and in no distress Mental status -patient answers most questions appropriately but is a bit forgetful. Chest - clear to auscultation, no wheezes, rales or rhonchi, symmetric air entry Heart - normal rate, regular rhythm, normal S1, S2, no murmurs, rubs, clicks or gallops Extremities -no lower extremity edema.     08/13/2022    3:40 PM 08/03/2021    2:43 PM  MMSE - Mini Mental State Exam  Orientation to time 5 5  Orientation to Place 4 5  Registration 3 3  Attention/ Calculation 2 5  Recall 3 3  Language- name 2 objects 2 2  Language- repeat 1 1  Language- follow 3 step command 3 3  Language- read &  follow direction 1 1  Write a sentence 1 1  Copy design 1 0  Total score 26 29        Latest Ref Rng & Units 05/03/2022    3:42 PM 12/13/2021    2:27 PM 09/21/2021   10:51 AM  CMP  Glucose 70 - 99 mg/dL 86  102  131   BUN 8 - 27 mg/dL 29  35  29   Creatinine 0.76 - 1.27 mg/dL 3.98  2.81  2.94   Sodium 134 - 144 mmol/L 138  139  139   Potassium 3.5 - 5.2 mmol/L 5.0  5.8  5.2   Chloride 96 - 106 mmol/L 109  112  111   CO2 20 - 29 mmol/L 19  19  20    Calcium 8.6 - 10.2 mg/dL 8.0  8.5  8.7   Total Protein 6.0 - 8.5 g/dL 5.3     Total Bilirubin 0.0 - 1.2 mg/dL <0.2     Alkaline Phos 44 - 121 IU/L 129     AST 0 - 40 IU/L 10     ALT 0 - 44 IU/L 13      Lipid Panel     Component Value Date/Time   CHOL 395 (H) 05/03/2022 1542   TRIG 168 (H) 05/03/2022 1542   HDL 78 05/03/2022 1542   CHOLHDL 5.1 (H) 05/03/2022 1542   CHOLHDL 4.4 12/21/2019 0123   VLDL 23 12/21/2019 0123   LDLCALC 284 (H) 05/03/2022 1542    CBC    Component Value Date/Time   WBC 5.4 07/03/2022 0826   RBC 3.86 (L) 07/03/2022 0826   HGB 10.9 (L) 07/03/2022 0826   HGB 11.1 (L) 09/06/2020 1327   HCT 32.9 (L) 07/03/2022 0826   HCT 34.6 (L) 09/06/2020 1327   PLT 158 07/03/2022 0826   PLT 164 09/06/2020 1327   MCV 85.2 07/03/2022 0826   MCV 85 09/06/2020 1327  MCH 28.2 07/03/2022 0826   MCHC 33.1 07/03/2022 0826   RDW 13.9 07/03/2022 0826   RDW 14.2 09/06/2020 1327   LYMPHSABS 1.3 12/22/2019 0424   MONOABS 0.5 12/22/2019 0424   EOSABS 0.2 12/22/2019 0424   BASOSABS 0.0 12/22/2019 0424    ASSESSMENT AND PLAN:  1. Type 2 diabetes mellitus with background retinopathy ([place]) Diabetes under good control through diet alone. - POCT glucose (manual entry) - POCT glycosylated hemoglobin (Hb A1C)  2. Type 2 DM with CKD stage 5 and hypertension ([place]) Followed by nephrologist.  He is now at the point of needing renal replacement therapy and dialysis is being discussed.  He was also referred to [facility]  to see if he would be a transplant candidate. -Discussed the importance of taking his blood pressure medicines consistently as prescribed. Follow-up with me in 1 month.  Advised to bring his medicines with him. Stop Cozaar given his GFR.  3. Memory deficit Mini-Mental status exam showed slight decrease in his score compared to 1 year ago.  Major decrease was in attention/calculation. Went over with him things to do to help him remember appointments and other things like keeping a calendar on his refrigerator writing and dates and times of appointments.  Advised to keep his medication box filled each week and placed on his kitchen table so that it is very visible to him.    Patient was given the opportunity to ask questions.  Patient verbalized understanding of the plan and was able to repeat key elements of the plan.   This documentation was completed using Radio producer.  Any transcriptional errors are unintentional.  Orders Placed This Encounter  Procedures   POCT glucose (manual entry)   POCT glycosylated hemoglobin (Hb A1C)     Requested Prescriptions    No prescriptions requested or ordered in this encounter    No follow-ups on file.  Karle Plumber, MD, FACP

## 2022-08-14 LAB — CUP PACEART REMOTE DEVICE CHECK
Date Time Interrogation Session: 20240318230740
Implantable Pulse Generator Implant Date: 20220429

## 2022-08-16 ENCOUNTER — Ambulatory Visit: Payer: 59 | Attending: Internal Medicine

## 2022-08-16 DIAGNOSIS — Z0000 Encounter for general adult medical examination without abnormal findings: Secondary | ICD-10-CM

## 2022-08-16 NOTE — Patient Instructions (Addendum)
[name] , Thank you for taking time to come for your Medicare Wellness Visit. I appreciate your ongoing commitment to your health goals. Please review the following plan we discussed and let me know if I can assist you in the future.   These are the goals we discussed:  Goals      Eat Healthy     Timeframe:  Long-Range Goal Priority:  High Start Date:    08/03/21                         Expected End Date:    04/2022                   Follow Up Date 08/25/2021   Goal is to maintain healthy eating habits     Why is this important?   When you are ready to manage your nutrition or weight, having a plan and setting goals will help.  Taking small steps to change how you eat and exercise is a good place to start.    Notes:         This is a list of the screening recommended for you and due dates:  Health Maintenance  Topic Date Due   Eye exam for diabetics  06/27/2022   Flu Shot  08/25/2022*   Zoster (Shingles) Vaccine (1 of 2) 11/16/2022*   COVID-19 Vaccine (3 - Pfizer risk series) 05/14/2023*   Yearly kidney health urinalysis for diabetes  12/14/2022   Complete foot exam   12/14/2022   Hemoglobin A1C  02/13/2023   Yearly kidney function blood test for diabetes  05/04/2023   Medicare Annual Wellness Visit  08/16/2023   Colon Cancer Screening  02/21/2027   DTaP/Tdap/Td vaccine (2 - Td or Tdap) 08/31/2027   Hepatitis C Screening: USPSTF Recommendation to screen - Ages 30-79 yo.  Completed   HIV Screening  Completed   HPV Vaccine  Aged Out  *Topic was postponed. The date shown is not the original due date.   Health Maintenance After Age 67 After age 83, you are at a higher risk for certain long-term diseases and infections as well as injuries from falls. Falls are a major cause of broken bones and head injuries in people who are older than age 21. Getting regular preventive care can help to keep you healthy and well. Preventive care includes getting regular testing and making  lifestyle changes as recommended by your health care provider. Talk with your health care provider about: Which screenings and tests you should have. A screening is a test that checks for a disease when you have no symptoms. A diet and exercise plan that is right for you. What should I know about screenings and tests to prevent falls? Screening and testing are the best ways to find a health problem early. Early diagnosis and treatment give you the best chance of managing medical conditions that are common after age 80. Certain conditions and lifestyle choices may make you more likely to have a fall. Your health care provider may recommend: Regular vision checks. Poor vision and conditions such as cataracts can make you more likely to have a fall. If you wear glasses, make sure to get your prescription updated if your vision changes. Medicine review. Work with your health care provider to regularly review all of the medicines you are taking, including over-the-counter medicines. Ask your health care provider about any side effects that may make you more likely to  have a fall. Tell your health care provider if any medicines that you take make you feel dizzy or sleepy. Strength and balance checks. Your health care provider may recommend certain tests to check your strength and balance while standing, walking, or changing positions. Foot health exam. Foot pain and numbness, as well as not wearing proper footwear, can make you more likely to have a fall. Screenings, including: Osteoporosis screening. Osteoporosis is a condition that causes the bones to get weaker and break more easily. Blood pressure screening. Blood pressure changes and medicines to control blood pressure can make you feel dizzy. Depression screening. You may be more likely to have a fall if you have a fear of falling, feel depressed, or feel unable to do activities that you used to do. Alcohol use screening. Using too much alcohol can  affect your balance and may make you more likely to have a fall. Follow these instructions at home: Lifestyle Do not drink alcohol if: Your health care provider tells you not to drink. If you drink alcohol: Limit how much you have to: 0-1 drink a day for women. 0-2 drinks a day for men. Know how much alcohol is in your drink. In the [place] one drink equals one 12 oz bottle of beer (355 mL), one 5 oz glass of wine (148 mL), or one 1 oz glass of hard liquor (44 mL). Do not use any products that contain nicotine or tobacco. These products include cigarettes, chewing tobacco, and vaping devices, such as e-cigarettes. If you need help quitting, ask your health care provider. Activity  Follow a regular exercise program to stay fit. This will help you maintain your balance. Ask your health care provider what types of exercise are appropriate for you. If you need a cane or walker, use it as recommended by your health care provider. Wear supportive shoes that have nonskid soles. Safety  Remove any tripping hazards, such as rugs, cords, and clutter. Install safety equipment such as grab bars in bathrooms and safety rails on stairs. Keep rooms and walkways well-lit. General instructions Talk with your health care provider about your risks for falling. Tell your health care provider if: You fall. Be sure to tell your health care provider about all falls, even ones that seem minor. You feel dizzy, tiredness (fatigue), or off-balance. Take over-the-counter and prescription medicines only as told by your health care provider. These include supplements. Eat a healthy diet and maintain a healthy weight. A healthy diet includes low-fat dairy products, low-fat (lean) meats, and fiber from whole grains, beans, and lots of fruits and vegetables. Stay current with your vaccines. Schedule regular health, dental, and eye exams. Summary Having a healthy lifestyle and getting preventive care can help to protect  your health and wellness after age 69. Screening and testing are the best way to find a health problem early and help you avoid having a fall. Early diagnosis and treatment give you the best chance for managing medical conditions that are more common for people who are older than age 66. Falls are a major cause of broken bones and head injuries in people who are older than age 78. Take precautions to prevent a fall at home. Work with your health care provider to learn what changes you can make to improve your health and wellness and to prevent falls. This information is not intended to replace advice given to you by your health care provider. Make sure you discuss any questions you have with your health  care provider. Document Revised: 10/02/2020 Document Reviewed: 10/02/2020 Elsevier Patient Education  [place].

## 2022-08-16 NOTE — Progress Notes (Signed)
Subjective:   Douglas Briggs is a 62 y.o. male who presents for Medicare Annual/Subsequent preventive examination.  Review of Systems     I connected with Douglas Briggs on 08/16/2022 at 2:35 pm by telephone and verified that I am speaking with the correct person using two identifiers. I discussed the limitations, risks, security and privacy concerns of performing an evaluation and management service by telephone and the availability of in person appointments. I also discussed with the patient that there may be a patient responsible charge related to this service. The patient expressed understanding and agreed to proceed.   Patient location: Home My Location: [place] on the telephone call: Myself and [place]     Cardiac Risk Factors include: none     Objective:    There were no vitals filed for this visit. There is no height or weight on file to calculate BMI.     08/16/2022    2:37 PM 07/03/2022    8:24 AM 06/19/2022    6:32 AM 09/22/2020    7:54 AM 02/18/2020    2:22 PM 12/19/2019    5:00 PM 12/19/2019   12:06 AM  Advanced Directives  Does Patient Have a Medical Advance Directive? Yes No No Yes No No No  Type of Primary school teacher of [place];Living will     Copy of [place] in Chart?    No - copy requested     Would patient like information on creating a medical advance directive?  Yes (MAU/Ambulatory/Procedural Areas - Information given) Yes (MAU/Ambulatory/Procedural Areas - Information given)  No - Patient declined Yes (ED - Information included in AVS) Yes (ED - Information included in AVS)    Current Medications (verified) Outpatient Encounter Medications as of 08/16/2022  Medication Sig   amLODipine (NORVASC) 10 MG tablet Take 10 mg by mouth daily.   aspirin EC 81 MG tablet Take 81 mg by mouth daily. Swallow whole.   atorvastatin (LIPITOR) 40 MG tablet Take 2 tablets (80 mg total) by  mouth daily in the afternoon.   carvedilol (COREG) 6.25 MG tablet Take 1 tablet (6.25 mg total) by mouth 2 (two) times daily with a meal.   ezetimibe (ZETIA) 10 MG tablet Take 10 mg by mouth daily.   famotidine (PEPCID) 20 MG tablet Take 20 mg by mouth 2 (two) times daily as needed for heartburn or indigestion.   furosemide (LASIX) 20 MG tablet Take 1 tablet (20 mg total) by mouth daily.   hydrALAZINE (APRESOLINE) 50 MG tablet Take 1 tablet (50 mg total) by mouth 3 (three) times daily.   ketorolac (ACULAR) 0.5 % ophthalmic solution SMARTSIG:In Eye(s)   LOKELMA 10 g PACK packet Take by mouth.   prednisoLONE acetate (PRED FORTE) 1 % ophthalmic suspension    sodium bicarbonate 650 MG tablet Take 1,300 mg by mouth 3 (three) times daily.   No facility-administered encounter medications on file as of 08/16/2022.    Allergies (verified) Patient has no known allergies.   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 Dr. Tacy Dura - seen 11/24/19   Protein-calorie malnutrition, mild ([place]) 09/11/2020   Restless leg syndrome 11/14/2016   Right sided numbness    Stroke ([place])    Stroke-like symptoms 10/29/2019   TIA (transient ischemic attack) 10/29/2019   Tinea pedis of both  feet 03/12/2017   Ulcer of left lower extremity with fat layer exposed ([place]) 01/20/2020   Uncontrolled type 2 diabetes mellitus with diabetic polyneuropathy, without long-term current use of insulin 11/12/2016   Past Surgical History:  Procedure Laterality Date   AMPUTATION Left 12/20/2019   Procedure: AMPUTATION 5TH TOE;  Surgeon: Erle Crocker, MD;  Location: [facility];  Service: Orthopedics;  Laterality: Left;   BUBBLE STUDY  09/22/2020   Procedure: BUBBLE STUDY;  Surgeon: Donato Heinz, MD;  Location: [facility];  Service: Cardiovascular;;   CATARACT EXTRACTION Bilateral 02/2021   LOOP RECORDER INSERTION N/A 09/22/2020   Procedure: LOOP RECORDER INSERTION;  Surgeon: Constance Haw, MD;  Location: [facility];  Service: Cardiovascular;  Laterality: N/A;   TEE WITHOUT CARDIOVERSION N/A 09/22/2020   Procedure: TRANSESOPHAGEAL ECHOCARDIOGRAM (TEE);  Surgeon: Donato Heinz, MD;  [facility] ENDOSCOPY;  Service: Cardiovascular;  Laterality: N/A;  LOOP   Family History  Problem Relation Age of Onset   Hypertension Father    Cancer Father    Diabetes Paternal Aunt    Heart disease Neg Hx    Social History   Socioeconomic History   Marital status: Single    Spouse name: Not on file   Number of children: Not on file   Years of education: Not on file   Highest education level: Not on file  Occupational History   Not on file  Tobacco Use   Smoking status: Never   Smokeless tobacco: Never  Vaping Use   Vaping Use: Never used  Substance and Sexual Activity   Alcohol use: No   Drug use: Never   Sexual activity: Not on file  Other Topics Concern   Not on file  Social History Narrative   Not on file   Social Determinants of Health   Financial Resource Strain: Low Risk  (08/16/2022)   Overall Financial Resource Strain (CARDIA)    Difficulty of Paying Living Expenses: Not hard at all  Food Insecurity: No Food Insecurity (08/16/2022)   Hunger  Vital Sign    Worried About Running Out of Food in the Last Year: Never true    [place] in the Last Year: Never true  Transportation Needs: No Transportation Needs (08/16/2022)   PRAPARE - Hydrologist (Medical): No    Lack of Transportation (Non-Medical): No  Physical Activity: Insufficiently Active (08/16/2022)   Exercise Vital Sign    Days of Exercise per Week: 2 days    Minutes of Exercise per Session: 30 min  Stress: No Stress Concern Present (08/16/2022)   [place]    Feeling of Stress : Not at all  Social Connections: Moderately Isolated (08/16/2022)   Social Connection and Isolation Panel [NHANES]    Frequency of Communication with Friends and Family:  More than three times a week    Frequency of Social Gatherings with Friends and Family: More than three times a week    Attends Religious Services: More than 4 times per year    Active Member of [employer] or Organizations: No    Attends Music therapist: Never    Marital Status: Divorced    Tobacco Counseling Counseling given: Not Answered   Clinical Intake:  Pre-visit preparation completed: No  Pain : No/denies pain     Nutritional Risks: None Diabetes: Yes CBG done?: No  How often do you need to have someone help you when you read instructions, pamphlets, or other written materials from your doctor or pharmacy?: 1 - Never  Diabetic?Nutrition Risk Assessment:  Has the patient had any N/V/D within the last 2 months?  No  Does the patient have any non-healing wounds?  No  Has the patient had any unintentional weight loss or weight gain?  Yes   Diabetes:  Is the patient diabetic?  Yes  If diabetic, was a CBG obtained today?  No  Did the patient bring in their glucometer from home?  No  How often do you monitor your CBG's? BID.   Financial Strains and Diabetes Management:  Are you having any financial  strains with the device, your supplies or your medication? No .  Does the patient want to be seen by Chronic Care Management for management of their diabetes?  No  Would the patient like to be referred to a Nutritionist or for Diabetic Management?  No   Diabetic Exams:  Diabetic Eye Exam: Overdue for diabetic eye exam. Pt has been advised about the importance in completing this exam. Patient advised to call and schedule an eye exam. Diabetic Foot Exam: Completed 12/13/2021    Interpreter Needed?: No      Activities of Daily Living    08/16/2022    2:38 PM  In your present state of health, do you have any difficulty performing the following activities:  Hearing? 0  Vision? 1  Difficulty concentrating or making decisions? 0  Walking or climbing stairs? 0  Dressing or bathing? 0  Doing errands, shopping? 0  Preparing Food and eating ? N  Using the Toilet? N  In the past six months, have you accidently leaked urine? N  Do you have problems with loss of bowel control? N  Managing your Medications? N  Managing your Finances? N  Housekeeping or managing your Housekeeping? N    Patient Care Team: Ladell Pier, MD as PCP - General (Internal Medicine)  Indicate any recent Medical Services you may have received from other than [facility] providers in the past year (date may be approximate).     Assessment:   This is a routine wellness examination for Douglas Briggs.  Hearing/Vision screen No results found.  Dietary issues and exercise activities discussed: Current Exercise Habits: The patient does not participate in regular exercise at present, Exercise limited by: None identified   Goals Addressed   None    Depression Screen    08/13/2022    2:42 PM 06/14/2022    3:08 PM 05/03/2022    2:36 PM 12/13/2021    1:47 PM 08/30/2021   11:39 AM 08/03/2021    2:51 PM 08/03/2021    2:42 PM  PHQ 2/9 Scores  PHQ - 2 Score 4 0 2 0 2 3 0  PHQ- 9 Score 8 0 10  5 5      Fall Risk  08/16/2022     2:37 PM 08/13/2022    2:35 PM 06/14/2022    3:08 PM 05/03/2022    2:35 PM 12/13/2021    1:47 PM  Fall Risk   Falls in the past year? 0 0 0 0 0  Number falls in past yr: 0 0 0 0 0  Injury with Fall? 0 0 0 0 0  Risk for fall due to : No Fall Risks No Fall Risks No Fall Risks  No Fall Risks    FALL RISK PREVENTION PERTAINING TO THE HOME:  Any stairs in or around the home? No  If so, are there any without handrails? No  Home free of loose throw rugs in walkways, pet beds, electrical cords, etc? No  Adequate lighting in your home to reduce risk of falls? No   ASSISTIVE DEVICES UTILIZED TO PREVENT FALLS:  Life alert? No  Use of a cane, walker or w/c? No  Grab bars in the bathroom? No  Shower chair or bench in shower? Yes  Elevated toilet seat or a handicapped toilet? No   TIMED UP AND GO:  Was the test performed? No .  Length of time to ambulate 10 feet: N/A sec.   Gait slow and steady without use of assistive device  Cognitive Function:    08/16/2022    2:38 PM 08/13/2022    3:40 PM 08/03/2021    2:43 PM  MMSE - Mini Mental State Exam  Orientation to time 5 5 5   Orientation to Place 5 4 5   Registration 3 3 3   Attention/ Calculation 5 2 5   Recall 3 3 3   Language- name 2 objects 2 2 2   Language- repeat 1 1 1   Language- follow 3 step command 3 3 3   Language- read & follow direction 1 1 1   Write a sentence 1 1 1   Copy design 1 1 0  Total score 30 26 29         08/16/2022    2:38 PM 08/03/2021    2:44 PM  6CIT Screen  What Year? 0 points 0 points  What month? 0 points 0 points  What time? 0 points 0 points  Count back from 20 0 points 0 points  Months in reverse 0 points 0 points  Repeat phrase 0 points 0 points  Total Score 0 points 0 points    Immunizations Immunization History  Administered Date(s) Administered   PFIZER(Purple Top)SARS-COV-2 Vaccination 08/09/2019, 08/30/2019   PNEUMOCOCCAL CONJUGATE-20 01/12/2021   Pneumococcal Polysaccharide-23  10/30/2019   Tdap 08/30/2017    TDAP status: Up to date  Flu Vaccine status: Declined, Education has been provided regarding the importance of this vaccine but patient still declined. Advised may receive this vaccine at local pharmacy or Health Dept. Aware to provide a copy of the vaccination record if obtained from local pharmacy or Health Dept. Verbalized acceptance and understanding. Patient declined  Pneumococcal vaccine status: Declined,  Education has been provided regarding the importance of this vaccine but patient still declined. Advised may receive this vaccine at local pharmacy or Health Dept. Aware to provide a copy of the vaccination record if obtained from local pharmacy or Health Dept. Verbalized acceptance and understanding.  Patient declined  Covid-19 vaccine status: Completed vaccines  Qualifies for Shingles Vaccine? No   Zostavax completed No   Shingrix Completed?: No.    Education has been provided regarding the importance of this vaccine. Patient has been advised to call insurance company to determine out  of pocket expense if they have not yet received this vaccine. Advised may also receive vaccine at local pharmacy or Health Dept. Verbalized acceptance and understanding. Patient declined  Screening Tests Health Maintenance  Topic Date Due   Zoster Vaccines- Shingrix (1 of 2) Never done   OPHTHALMOLOGY EXAM  06/27/2022   Medicare Annual Wellness (AWV)  08/04/2022   INFLUENZA VACCINE  08/25/2022 (Originally 12/25/2021)   COVID-19 Vaccine (3 - Pfizer risk series) 05/14/2023 (Originally 09/27/2019)   Diabetic kidney evaluation - Urine ACR  12/14/2022   FOOT EXAM  12/14/2022   HEMOGLOBIN A1C  02/13/2023   Diabetic kidney evaluation - eGFR measurement  05/04/2023   COLONOSCOPY (Pts 45-32yrs Insurance coverage will need to be confirmed)  02/21/2027   DTaP/Tdap/Td (2 - Td or Tdap) 08/31/2027   Hepatitis C Screening  Completed   HIV Screening  Completed   HPV VACCINES  Aged  Out    Health Maintenance  Health Maintenance Due  Topic Date Due   Zoster Vaccines- Shingrix (1 of 2) Never done   OPHTHALMOLOGY EXAM  06/27/2022   Medicare Annual Wellness (AWV)  08/04/2022    Colorectal cancer screening: Type of screening: Colonoscopy. Completed 02/20/2017. Repeat every 10 years  Lung Cancer Screening: (Low Dose CT Chest recommended if Age 79-80 years, 30 pack-year currently smoking OR have quit w/in 15years.) does not qualify.   Lung Cancer Screening Referral: N/A  Additional Screening:  Hepatitis C Screening: does qualify; Completed 01/12/2021  Vision Screening: Recommended annual ophthalmology exams for early detection of glaucoma and other disorders of the eye. Is the patient up to date with their annual eye exam?  Yes  Who is the provider or what is the name of the office in which the patient attends annual eye exams? [facility] If pt is not established with a provider, would they like to be referred to a provider to establish care? No .   Dental Screening: Recommended annual dental exams for proper oral hygiene  Community Resource Referral / Chronic Care Management: CRR required this visit?  No   CCM required this visit?  No      Plan:     I have personally reviewed and noted the following in the patient's chart:   Medical and social history Use of alcohol, tobacco or illicit drugs  Current medications and supplements including opioid prescriptions. Patient is not currently taking opioid prescriptions. Functional ability and status Nutritional status Physical activity Advanced directives List of other physicians Hospitalizations, surgeries, and ER visits in previous 12 months Vitals Screenings to include cognitive, depression, and falls Referrals and appointments  In addition, I have reviewed and discussed with patient certain preventive protocols, quality metrics, and best practice recommendations. A written personalized care  plan for preventive services as well as general preventive health recommendations were provided to patient.     Gomez Cleverly, [place]   08/16/2022   Nurse Notes: I spent 20 minutes on this telephone encounter AVS mailed to patient

## 2022-08-20 ENCOUNTER — Encounter (HOSPITAL_COMMUNITY)
Admission: RE | Admit: 2022-08-20 | Discharge: 2022-08-20 | Disposition: A | Discharge status: Home / Self Care | Payer: 59 | Source: Ambulatory Visit | Attending: Nephrology | Admitting: Nephrology

## 2022-08-20 DIAGNOSIS — D631 Anemia in chronic kidney disease: Secondary | ICD-10-CM | POA: Diagnosis not present

## 2022-08-20 DIAGNOSIS — F17200 Nicotine dependence, unspecified, uncomplicated: Secondary | ICD-10-CM | POA: Diagnosis not present

## 2022-08-20 DIAGNOSIS — N185 Chronic kidney disease, stage 5: Secondary | ICD-10-CM | POA: Diagnosis not present

## 2022-08-20 DIAGNOSIS — Z79899 Other long term (current) drug therapy: Secondary | ICD-10-CM | POA: Diagnosis not present

## 2022-08-20 MED ORDER — SODIUM CHLORIDE 0.9 % IV SOLN
200.0000 mg | INTRAVENOUS | Status: DC
  Administered 2022-08-20: 200 mg via INTRAVENOUS
  Filled 2022-08-20: qty 200

## 2022-08-23 ENCOUNTER — Encounter: Payer: Self-pay | Admitting: Internal Medicine

## 2022-08-23 NOTE — Progress Notes (Signed)
Labs received from patient's nephrologist that were done 08/09/2022.  Creatinine 5.8, GFR 10, potassium 5.1 Hemoglobin 9.9.

## 2022-08-27 NOTE — Addendum Note (Signed)
Addended by: Gomez Cleverly on: 08/27/2022 10:36 AM   Modules accepted: Level of Service

## 2022-08-27 NOTE — Progress Notes (Signed)
complete

## 2022-08-30 DIAGNOSIS — E118 Type 2 diabetes mellitus with unspecified complications: Secondary | ICD-10-CM | POA: Diagnosis not present

## 2022-09-04 ENCOUNTER — Other Ambulatory Visit: Payer: Self-pay

## 2022-09-04 DIAGNOSIS — N189 Chronic kidney disease, unspecified: Secondary | ICD-10-CM

## 2022-09-04 DIAGNOSIS — I739 Peripheral vascular disease, unspecified: Secondary | ICD-10-CM

## 2022-09-12 ENCOUNTER — Ambulatory Visit (HOSPITAL_COMMUNITY): Payer: 59

## 2022-09-12 ENCOUNTER — Ambulatory Visit: Payer: 59 | Admitting: Physician Assistant

## 2022-09-16 ENCOUNTER — Encounter: Payer: 59 | Admitting: Surgery

## 2022-09-16 ENCOUNTER — Encounter (HOSPITAL_COMMUNITY): Payer: 59

## 2022-09-16 DIAGNOSIS — I639 Cerebral infarction, unspecified: Secondary | ICD-10-CM

## 2022-09-16 LAB — CUP PACEART REMOTE DEVICE CHECK
Date Time Interrogation Session: 20240420230328
Implantable Pulse Generator Implant Date: 20220429

## 2022-09-23 NOTE — Progress Notes (Signed)
Carelink Summary Report / Loop Recorder 

## 2022-09-30 ENCOUNTER — Ambulatory Visit (HOSPITAL_COMMUNITY): Payer: 59 | Attending: Surgery

## 2022-09-30 ENCOUNTER — Ambulatory Visit (HOSPITAL_COMMUNITY): Payer: 59

## 2022-09-30 ENCOUNTER — Encounter: Payer: 59 | Admitting: Surgery

## 2022-09-30 NOTE — Progress Notes (Deleted)
Vascular and Vein Specialist of [facility]  Patient name: Douglas Briggs MRN: [id number] DOB: Apr 21, 1961 Sex: male   REQUESTING PROVIDER:    Dr. Gust Brooms   REASON FOR CONSULT:    Dialysis access  HISTORY OF PRESENT ILLNESS:   Douglas Briggs is a 62 y.o. male, who is ***  PAST MEDICAL HISTORY    Past Medical History:  Diagnosis Date   Acquired hammer toe of left foot 03/12/2017   Acute CVA (cerebrovascular accident) ([facility]) 10/30/2019   Atherosclerotic vascular disease 11/22/2019   Cellulitis 12/19/2019   CKD (chronic kidney disease)    Diabetes mellitus due to underlying condition with retinopathy of both eyes ([facility])    Dry gangrene ([facility]) 12/19/2019   Essential hypertension 11/22/2019   HLD (hyperlipidemia)    Hyperlipidemia associated with type 2 diabetes mellitus ([facility]) 09/11/2020   Hypertension    Hypertension associated with diabetes ([facility])    Influenza vaccination declined 01/20/2020   Male hypogonadism 11/14/2016   Neuropathy    Normocytic anemia    NPDR (nonproliferative diabetic retinopathy) ([facility]CC) 02/09/2020   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 Dr. Lyman Bishop - seen 11/24/19   Protein-calorie malnutrition, mild (H[facility]) 09/11/2020   Restless leg syndrome 11/14/2016   Right sided numbness    Stroke ([facility])    Stroke-like symptoms 10/29/2019   TIA (transient ischemic attack) 10/29/2019   Tinea pedis of both feet 03/12/2017   Ulcer of left lower extremity with fat layer exposed ([facility]) 01/20/2020   Uncontrolled type 2 diabetes mellitus with diabetic polyneuropathy, without long-term current use of insulin 11/12/2016     FAMILY HISTORY   Family History  Problem Relation Age of Onset   Hypertension Father    Cancer Father    Diabetes Paternal Aunt    Heart disease Neg Hx     SOCIAL HISTORY:   Social History   Socioeconomic History   Marital status: Single    Spouse name: Not on file   Number of children: Not on file   Years of education:  Not on file   Highest education level: Not on file  Occupational History   Not on file  Tobacco Use   Smoking status: Never   Smokeless tobacco: Never  Vaping Use   Vaping Use: Never used  Substance and Sexual Activity   Alcohol use: No   Drug use: Never   Sexual activity: Not on file  Other Topics Concern   Not on file  Social History Narrative   Not on file   Social Determinants of Health   Financial Resource Strain: Low Risk  (08/16/2022)   Overall Financial Resource Strain (CARDIA)    Difficulty of Paying Living Expenses: Not hard at all  Food Insecurity: No Food Insecurity (08/16/2022)   Hunger Vital Sign    Worried About Running Out of Food in the Last Year: Never true    Ran Out of Food in the Last Year: Never true  Transportation Needs: No Transportation Needs (08/16/2022)   PRAPARE - Administrator, Civil Service (Medical): No    Lack of Transportation (Non-Medical): No  Physical Activity: Insufficiently Active (08/16/2022)   Exercise Vital Sign    Days of Exercise per Week: 2 days    Minutes of Exercise per Session: 30 min  Stress: No Stress Concern Present (08/16/2022)   [employer] of Occupational Health - Occupational Stress Questionnaire    Feeling of Stress : Not at all  Social Connections: Moderately Isolated (08/16/2022)  Social Advertising account executive [NHANES]    Frequency of Communication with Friends and Family: More than three times a week    Frequency of Social Gatherings with Friends and Family: More than three times a week    Attends Religious Services: More than 4 times per year    Active Member of [employer] or Organizations: No    Attends Banker Meetings: Never    Marital Status: Divorced  Catering manager Violence: Not At Risk (08/16/2022)   Humiliation, Afraid, Rape, and Kick questionnaire    Fear of Current or Ex-Partner: No    Emotionally Abused: No    Physically Abused: No    Sexually Abused: No     ALLERGIES:    No Known Allergies  CURRENT MEDICATIONS:    Current Outpatient Medications  Medication Sig Dispense Refill   amLODipine (NORVASC) 10 MG tablet Take 10 mg by mouth daily.     aspirin EC 81 MG tablet Take 81 mg by mouth daily. Swallow whole.     atorvastatin (LIPITOR) 40 MG tablet Take 2 tablets (80 mg total) by mouth daily in the afternoon. 180 tablet 1   carvedilol (COREG) 6.25 MG tablet Take 1 tablet (6.25 mg total) by mouth 2 (two) times daily with a meal. 180 tablet 1   ezetimibe (ZETIA) 10 MG tablet Take 10 mg by mouth daily.     famotidine (PEPCID) 20 MG tablet Take 20 mg by mouth 2 (two) times daily as needed for heartburn or indigestion.     furosemide (LASIX) 20 MG tablet Take 1 tablet (20 mg total) by mouth daily. 90 tablet 1   hydrALAZINE (APRESOLINE) 50 MG tablet Take 1 tablet (50 mg total) by mouth 3 (three) times daily. 270 tablet 1   ketorolac (ACULAR) 0.5 % ophthalmic solution SMARTSIG:In Eye(s)     LOKELMA 10 g PACK packet Take by mouth.     prednisoLONE acetate (PRED FORTE) 1 % ophthalmic suspension      sodium bicarbonate 650 MG tablet Take 1,300 mg by mouth 3 (three) times daily.     No current facility-administered medications for this visit.    REVIEW OF SYSTEMS:   [X]  denotes positive finding, [ ]  denotes negative finding Cardiac  Comments:  Chest pain or chest pressure: ***   Shortness of breath upon exertion:    Short of breath when lying flat:    Irregular heart rhythm:        Vascular    Pain in calf, thigh, or hip brought on by ambulation:    Pain in feet at night that wakes you up from your sleep:     Blood clot in your veins:    Leg swelling:         Pulmonary    Oxygen at home:    Productive cough:     Wheezing:         Neurologic    Sudden weakness in arms or legs:     Sudden numbness in arms or legs:     Sudden onset of difficulty speaking or slurred speech:    Temporary loss of vision in one eye:     Problems with  dizziness:         Gastrointestinal    Blood in stool:      Vomited blood:         Genitourinary    Burning when urinating:     Blood in urine:  Psychiatric    Major depression:         Hematologic    Bleeding problems:    Problems with blood clotting too easily:        Skin    Rashes or ulcers:        Constitutional    Fever or chills:     PHYSICAL EXAM:   There were no vitals filed for this visit.  GENERAL: The patient is a well-nourished male, in no acute distress. The vital signs are documented above. CARDIAC: There is a regular rate and rhythm.  VASCULAR: *** PULMONARY: Nonlabored respirations ABDOMEN: Soft and non-tender with normal pitched bowel sounds.  MUSCULOSKELETAL: There are no major deformities or cyanosis. NEUROLOGIC: No focal weakness or paresthesias are detected. SKIN: There are no ulcers or rashes noted. PSYCHIATRIC: The patient has a normal affect.  STUDIES:   ***  ASSESSMENT and PLAN   ***   Charlena Cross, MD, FACS [facility] [phone] Pager [phone]

## 2022-10-02 ENCOUNTER — Ambulatory Visit: Payer: 59 | Admitting: Physician Assistant

## 2022-10-17 ENCOUNTER — Ambulatory Visit: Payer: 59 | Admitting: Physician Assistant

## 2022-10-17 DIAGNOSIS — E872 Acidosis, unspecified: Secondary | ICD-10-CM | POA: Diagnosis not present

## 2022-10-17 DIAGNOSIS — I129 Hypertensive chronic kidney disease with stage 1 through stage 4 chronic kidney disease, or unspecified chronic kidney disease: Secondary | ICD-10-CM | POA: Diagnosis not present

## 2022-10-17 DIAGNOSIS — N19 Unspecified kidney failure: Secondary | ICD-10-CM | POA: Diagnosis not present

## 2022-10-17 DIAGNOSIS — N185 Chronic kidney disease, stage 5: Secondary | ICD-10-CM | POA: Diagnosis not present

## 2022-10-17 DIAGNOSIS — D631 Anemia in chronic kidney disease: Secondary | ICD-10-CM | POA: Diagnosis not present

## 2022-10-17 DIAGNOSIS — N2581 Secondary hyperparathyroidism of renal origin: Secondary | ICD-10-CM | POA: Diagnosis not present

## 2022-10-17 DIAGNOSIS — N189 Chronic kidney disease, unspecified: Secondary | ICD-10-CM | POA: Diagnosis not present

## 2022-10-17 DIAGNOSIS — E875 Hyperkalemia: Secondary | ICD-10-CM | POA: Diagnosis not present

## 2022-10-17 NOTE — Progress Notes (Signed)
Carelink Summary Report / Loop Recorder 

## 2022-10-18 LAB — CUP PACEART REMOTE DEVICE CHECK
Date Time Interrogation Session: 20240523230346
Implantable Pulse Generator Implant Date: 20220429

## 2022-10-18 LAB — LAB REPORT - SCANNED: EGFR: 8

## 2022-10-22 ENCOUNTER — Encounter (HOSPITAL_COMMUNITY): Payer: 59

## 2022-10-22 DIAGNOSIS — I639 Cerebral infarction, unspecified: Secondary | ICD-10-CM

## 2022-10-29 ENCOUNTER — Institutional Professional Consult (permissible substitution) (HOSPITAL_BASED_OUTPATIENT_CLINIC_OR_DEPARTMENT_OTHER): Payer: 59 | Admitting: Internal Medicine

## 2022-11-04 DIAGNOSIS — Z992 Dependence on renal dialysis: Secondary | ICD-10-CM | POA: Diagnosis not present

## 2022-11-04 DIAGNOSIS — N186 End stage renal disease: Secondary | ICD-10-CM | POA: Diagnosis not present

## 2022-11-08 DIAGNOSIS — Z992 Dependence on renal dialysis: Secondary | ICD-10-CM | POA: Diagnosis not present

## 2022-11-08 DIAGNOSIS — N186 End stage renal disease: Secondary | ICD-10-CM | POA: Diagnosis not present

## 2022-11-08 DIAGNOSIS — N2581 Secondary hyperparathyroidism of renal origin: Secondary | ICD-10-CM | POA: Diagnosis not present

## 2022-11-11 DIAGNOSIS — Z992 Dependence on renal dialysis: Secondary | ICD-10-CM | POA: Diagnosis not present

## 2022-11-11 DIAGNOSIS — N186 End stage renal disease: Secondary | ICD-10-CM | POA: Diagnosis not present

## 2022-11-11 DIAGNOSIS — N2581 Secondary hyperparathyroidism of renal origin: Secondary | ICD-10-CM | POA: Diagnosis not present

## 2022-11-13 DIAGNOSIS — N186 End stage renal disease: Secondary | ICD-10-CM | POA: Diagnosis not present

## 2022-11-13 DIAGNOSIS — Z992 Dependence on renal dialysis: Secondary | ICD-10-CM | POA: Diagnosis not present

## 2022-11-13 DIAGNOSIS — N2581 Secondary hyperparathyroidism of renal origin: Secondary | ICD-10-CM | POA: Diagnosis not present

## 2022-11-13 NOTE — Progress Notes (Signed)
Carelink Summary Report / Loop Recorder 

## 2022-11-15 ENCOUNTER — Other Ambulatory Visit: Payer: Self-pay | Admitting: *Deleted

## 2022-11-15 DIAGNOSIS — N2581 Secondary hyperparathyroidism of renal origin: Secondary | ICD-10-CM | POA: Diagnosis not present

## 2022-11-15 DIAGNOSIS — N186 End stage renal disease: Secondary | ICD-10-CM | POA: Diagnosis not present

## 2022-11-15 DIAGNOSIS — N189 Chronic kidney disease, unspecified: Secondary | ICD-10-CM

## 2022-11-15 DIAGNOSIS — Z992 Dependence on renal dialysis: Secondary | ICD-10-CM | POA: Diagnosis not present

## 2022-11-18 DIAGNOSIS — Z992 Dependence on renal dialysis: Secondary | ICD-10-CM | POA: Diagnosis not present

## 2022-11-18 DIAGNOSIS — N2581 Secondary hyperparathyroidism of renal origin: Secondary | ICD-10-CM | POA: Diagnosis not present

## 2022-11-18 DIAGNOSIS — N186 End stage renal disease: Secondary | ICD-10-CM | POA: Diagnosis not present

## 2022-11-20 DIAGNOSIS — Z992 Dependence on renal dialysis: Secondary | ICD-10-CM | POA: Diagnosis not present

## 2022-11-20 DIAGNOSIS — N186 End stage renal disease: Secondary | ICD-10-CM | POA: Diagnosis not present

## 2022-11-20 DIAGNOSIS — N2581 Secondary hyperparathyroidism of renal origin: Secondary | ICD-10-CM | POA: Diagnosis not present

## 2022-11-21 LAB — CUP PACEART REMOTE DEVICE CHECK
Date Time Interrogation Session: 20240625230733
Implantable Pulse Generator Implant Date: 20220429

## 2022-11-22 DIAGNOSIS — N186 End stage renal disease: Secondary | ICD-10-CM | POA: Diagnosis not present

## 2022-11-22 DIAGNOSIS — N2581 Secondary hyperparathyroidism of renal origin: Secondary | ICD-10-CM | POA: Diagnosis not present

## 2022-11-22 DIAGNOSIS — Z992 Dependence on renal dialysis: Secondary | ICD-10-CM | POA: Diagnosis not present

## 2022-11-24 DIAGNOSIS — I120 Hypertensive chronic kidney disease with stage 5 chronic kidney disease or end stage renal disease: Secondary | ICD-10-CM | POA: Diagnosis not present

## 2022-11-24 DIAGNOSIS — N186 End stage renal disease: Secondary | ICD-10-CM | POA: Diagnosis not present

## 2022-11-24 DIAGNOSIS — Z992 Dependence on renal dialysis: Secondary | ICD-10-CM | POA: Diagnosis not present

## 2022-11-25 ENCOUNTER — Encounter (HOSPITAL_COMMUNITY): Payer: 59

## 2022-11-25 DIAGNOSIS — N2581 Secondary hyperparathyroidism of renal origin: Secondary | ICD-10-CM | POA: Diagnosis not present

## 2022-11-25 DIAGNOSIS — I639 Cerebral infarction, unspecified: Secondary | ICD-10-CM | POA: Diagnosis not present

## 2022-11-25 DIAGNOSIS — Z992 Dependence on renal dialysis: Secondary | ICD-10-CM | POA: Diagnosis not present

## 2022-11-25 DIAGNOSIS — N186 End stage renal disease: Secondary | ICD-10-CM | POA: Diagnosis not present

## 2022-11-26 ENCOUNTER — Ambulatory Visit (HOSPITAL_COMMUNITY): Payer: 59

## 2022-11-27 DIAGNOSIS — N186 End stage renal disease: Secondary | ICD-10-CM | POA: Diagnosis not present

## 2022-11-27 DIAGNOSIS — N2581 Secondary hyperparathyroidism of renal origin: Secondary | ICD-10-CM | POA: Diagnosis not present

## 2022-11-27 DIAGNOSIS — Z992 Dependence on renal dialysis: Secondary | ICD-10-CM | POA: Diagnosis not present

## 2022-11-29 DIAGNOSIS — N2581 Secondary hyperparathyroidism of renal origin: Secondary | ICD-10-CM | POA: Diagnosis not present

## 2022-11-29 DIAGNOSIS — N186 End stage renal disease: Secondary | ICD-10-CM | POA: Diagnosis not present

## 2022-11-29 DIAGNOSIS — Z992 Dependence on renal dialysis: Secondary | ICD-10-CM | POA: Diagnosis not present

## 2022-12-02 DIAGNOSIS — N2581 Secondary hyperparathyroidism of renal origin: Secondary | ICD-10-CM | POA: Diagnosis not present

## 2022-12-02 DIAGNOSIS — N186 End stage renal disease: Secondary | ICD-10-CM | POA: Diagnosis not present

## 2022-12-02 DIAGNOSIS — Z992 Dependence on renal dialysis: Secondary | ICD-10-CM | POA: Diagnosis not present

## 2022-12-04 DIAGNOSIS — N186 End stage renal disease: Secondary | ICD-10-CM | POA: Diagnosis not present

## 2022-12-04 DIAGNOSIS — N2581 Secondary hyperparathyroidism of renal origin: Secondary | ICD-10-CM | POA: Diagnosis not present

## 2022-12-04 DIAGNOSIS — Z992 Dependence on renal dialysis: Secondary | ICD-10-CM | POA: Diagnosis not present

## 2022-12-05 ENCOUNTER — Ambulatory Visit (HOSPITAL_COMMUNITY): Payer: 59 | Attending: Vascular Surgery

## 2022-12-05 ENCOUNTER — Ambulatory Visit (HOSPITAL_COMMUNITY): Payer: 59

## 2022-12-06 DIAGNOSIS — Z992 Dependence on renal dialysis: Secondary | ICD-10-CM | POA: Diagnosis not present

## 2022-12-06 DIAGNOSIS — N2581 Secondary hyperparathyroidism of renal origin: Secondary | ICD-10-CM | POA: Diagnosis not present

## 2022-12-06 DIAGNOSIS — N186 End stage renal disease: Secondary | ICD-10-CM | POA: Diagnosis not present

## 2022-12-09 DIAGNOSIS — N2581 Secondary hyperparathyroidism of renal origin: Secondary | ICD-10-CM | POA: Diagnosis not present

## 2022-12-09 DIAGNOSIS — N186 End stage renal disease: Secondary | ICD-10-CM | POA: Diagnosis not present

## 2022-12-09 DIAGNOSIS — Z992 Dependence on renal dialysis: Secondary | ICD-10-CM | POA: Diagnosis not present

## 2022-12-10 ENCOUNTER — Encounter: Payer: Self-pay | Admitting: Vascular Surgery

## 2022-12-10 ENCOUNTER — Ambulatory Visit (INDEPENDENT_AMBULATORY_CARE_PROVIDER_SITE_OTHER)
Admission: RE | Admit: 2022-12-10 | Discharge: 2022-12-10 | Disposition: A | Discharge status: Home / Self Care | Payer: 59 | Source: Ambulatory Visit | Attending: Vascular Surgery | Admitting: Vascular Surgery

## 2022-12-10 ENCOUNTER — Ambulatory Visit (HOSPITAL_COMMUNITY)
Admission: RE | Admit: 2022-12-10 | Discharge: 2022-12-10 | Disposition: A | Discharge status: Home / Self Care | Payer: 59 | Source: Ambulatory Visit | Attending: Vascular Surgery | Admitting: Vascular Surgery

## 2022-12-10 ENCOUNTER — Ambulatory Visit (INDEPENDENT_AMBULATORY_CARE_PROVIDER_SITE_OTHER): Payer: 59 | Admitting: Vascular Surgery

## 2022-12-10 VITALS — BP 120/77 | HR 72 | Temp 97.9°F | Resp 16 | Ht 68.0 in | Wt 130.0 lb

## 2022-12-10 DIAGNOSIS — N186 End stage renal disease: Secondary | ICD-10-CM

## 2022-12-10 DIAGNOSIS — Z992 Dependence on renal dialysis: Secondary | ICD-10-CM

## 2022-12-10 DIAGNOSIS — N189 Chronic kidney disease, unspecified: Secondary | ICD-10-CM

## 2022-12-10 NOTE — Progress Notes (Signed)
Patient name: Douglas Briggs MRN: [id number] DOB: 1961/02/25 Sex: male  REASON FOR CONSULT: Evaluate for permanent hemodialysis access  HPI: Douglas Briggs is a 62 y.o. male, with history of CKD now ESRD, diabetes, hypertension, hyperlipidemia that presents for evaluation of permanent hemodialysis access.  He is well-known to vascular surgery and has previously been followed for asymptomatic PAD.  States has been on dialysis for 3 weeks.  Currently using a right IJ TDC.  No access in the past.  He has a left chest wall implant from a implantable loop recorder placed about 2 years ago.  States he is right-hand dominant.  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 Dr. Lyman Bishop - seen 11/24/19   Protein-calorie malnutrition, mild (HCC) 09/11/2020   Restless leg syndrome 11/14/2016   Right sided numbness    Stroke (HCC)    Stroke-like symptoms 10/29/2019   TIA (transient ischemic attack) 10/29/2019   Tinea pedis of both feet 03/12/2017   Ulcer of left lower extremity with fat layer exposed (HCC) 01/20/2020   Uncontrolled type 2 diabetes mellitus with diabetic polyneuropathy, without long-term current use of insulin 11/12/2016    Past Surgical History:  Procedure Laterality Date    AMPUTATION Left 12/20/2019   Procedure: AMPUTATION 5TH TOE;  Surgeon: Terance Hart, MD;  Location: [facility] OR;  Service: Orthopedics;  Laterality: Left;   BUBBLE STUDY  09/22/2020   Procedure: BUBBLE STUDY;  Surgeon: Little Ishikawa, MD;  Location: [facility] ENDOSCOPY;  Service: Cardiovascular;;   CATARACT EXTRACTION Bilateral 02/2021   LOOP RECORDER INSERTION N/A 09/22/2020   Procedure: LOOP RECORDER INSERTION;  Surgeon: Regan Lemming, MD;  Location: [facility] INVASIVE CV LAB;  Service: Cardiovascular;  Laterality: N/A;   TEE WITHOUT CARDIOVERSION N/A 09/22/2020   Procedure: TRANSESOPHAGEAL ECHOCARDIOGRAM (TEE);  Surgeon: Little Ishikawa, MD;  Location: [facility] ENDOSCOPY;  Service: Cardiovascular;  Laterality: N/A;  LOOP    Family History  Problem Relation Age of Onset   Hypertension Father    Cancer Father    Diabetes Paternal Aunt    Heart disease Neg Hx     SOCIAL HISTORY: Social History   Socioeconomic History   Marital status: Single    Spouse name: Not on file   Number of children: Not on file   Years of education: Not on file   Highest education level: Not on file  Occupational History   Not on file  Tobacco Use   Smoking status: Never   Smokeless tobacco: Never  Vaping Use   Vaping status: Never Used  Substance and Sexual Activity   Alcohol use: No   Drug use: Never   Sexual activity: Not on file  Other Topics Concern   Not on file  Social History Narrative   Not on  file   Social Determinants of Health   Financial Resource Strain: Low Risk  (08/16/2022)   Overall Financial Resource Strain (CARDIA)    Difficulty of Paying Living Expenses: Not hard at all  Food Insecurity: No Food Insecurity (08/16/2022)   Hunger Vital Sign    Worried About Running Out of Food in the Last Year: Never true    Ran Out of Food in the Last Year: Never true  Transportation Needs: No Transportation Needs (08/16/2022)   PRAPARE - Administrator, Civil Service  (Medical): No    Lack of Transportation (Non-Medical): No  Physical Activity: Insufficiently Active (08/16/2022)   Exercise Vital Sign    Days of Exercise per Week: 2 days    Minutes of Exercise per Session: 30 min  Stress: No Stress Concern Present (08/16/2022)   [employer] of Occupational Health - Occupational Stress Questionnaire    Feeling of Stress : Not at all  Social Connections: Moderately Isolated (08/16/2022)   Social Connection and Isolation Panel [NHANES]    Frequency of Communication with Friends and Family: More than three times a week    Frequency of Social Gatherings with Friends and Family: More than three times a week    Attends Religious Services: More than 4 times per year    Active Member of [employer] or Organizations: No    Attends Banker Meetings: Never    Marital Status: Divorced  Catering manager Violence: Not At Risk (08/16/2022)   Humiliation, Afraid, Rape, and Kick questionnaire    Fear of Current or Ex-Partner: No    Emotionally Abused: No    Physically Abused: No    Sexually Abused: No    No Known Allergies  Current Outpatient Medications  Medication Sig Dispense Refill   amLODipine (NORVASC) 10 MG tablet Take 10 mg by mouth daily.     aspirin EC 81 MG tablet Take 81 mg by mouth daily. Swallow whole.     atorvastatin (LIPITOR) 40 MG tablet Take 2 tablets (80 mg total) by mouth daily in the afternoon. 180 tablet 1   carvedilol (COREG) 6.25 MG tablet Take 1 tablet (6.25 mg total) by mouth 2 (two) times daily with a meal. 180 tablet 1   ezetimibe (ZETIA) 10 MG tablet Take 10 mg by mouth daily.     famotidine (PEPCID) 20 MG tablet Take 20 mg by mouth 2 (two) times daily as needed for heartburn or indigestion.     furosemide (LASIX) 20 MG tablet Take 1 tablet (20 mg total) by mouth daily. 90 tablet 1   hydrALAZINE (APRESOLINE) 50 MG tablet Take 1 tablet (50 mg total) by mouth 3 (three) times daily. 270 tablet 1   ketorolac (ACULAR) 0.5 %  ophthalmic solution SMARTSIG:In Eye(s)     LOKELMA 10 g PACK packet Take by mouth.     prednisoLONE acetate (PRED FORTE) 1 % ophthalmic suspension      sodium bicarbonate 650 MG tablet Take 1,300 mg by mouth 3 (three) times daily.     No current facility-administered medications for this visit.    REVIEW OF SYSTEMS:  [X]  denotes positive finding, [ ]  denotes negative finding Cardiac  Comments:  Chest pain or chest pressure:    Shortness of breath upon exertion:    Short of breath when lying flat:    Irregular heart rhythm:        Vascular    Pain in calf, thigh, or hip brought on by ambulation:  Pain in feet at night that wakes you up from your sleep:     Blood clot in your veins:    Leg swelling:         Pulmonary    Oxygen at home:    Productive cough:     Wheezing:         Neurologic    Sudden weakness in arms or legs:     Sudden numbness in arms or legs:     Sudden onset of difficulty speaking or slurred speech:    Temporary loss of vision in one eye:     Problems with dizziness:         Gastrointestinal    Blood in stool:     Vomited blood:         Genitourinary    Burning when urinating:     Blood in urine:        Psychiatric    Major depression:         Hematologic    Bleeding problems:    Problems with blood clotting too easily:        Skin    Rashes or ulcers:        Constitutional    Fever or chills:      PHYSICAL EXAM: There were no vitals filed for this visit.  GENERAL: The patient is a well-nourished male, in no acute distress. The vital signs are documented above. CARDIAC: There is a regular rate and rhythm.  VASCULAR:  Bilateral brachial and radial pulses palpable Right IJ TDC Left chest wall implantable loop recorder PULMONARY: No respiratory distress. ABDOMEN: Soft and non-tender. MUSCULOSKELETAL: There are no major deformities or cyanosis. NEUROLOGIC: No focal weakness or paresthesias are detected. SKIN: There are no ulcers or  rashes noted. PSYCHIATRIC: The patient has a normal affect.  DATA:   Vein mapping shows a marginal basilic vein in the right arm with a small cephalic vein  Assessment/Plan:  62 y.o. male, with history of CKD now ESRD, diabetes, hypertension, hyperlipidemia that presents for evaluation of permanent hemodialysis access.  Discussed that we normally recommend placement in the nondominant arm which would be his left arm.  Unfortunately has an implantable loop recorder in the left chest that I think would be high risk for left central venous stenosis.  As a result, I recommended right arm access.  His right cephalic vein is small.  I discussed he has a marginal basilic vein that may be usable.  Discussed this being done in two stages.  Discussed if he has no usable vein we will place AV graft given he is currently on dialysis.  Risks benefits discussed including risk of bleeding, infection, failure to mature, steal syndrome.   Cephus Shelling, MD [facility] Office: [phone]

## 2022-12-11 ENCOUNTER — Telehealth: Payer: Self-pay

## 2022-12-11 DIAGNOSIS — N186 End stage renal disease: Secondary | ICD-10-CM | POA: Diagnosis not present

## 2022-12-11 DIAGNOSIS — Z992 Dependence on renal dialysis: Secondary | ICD-10-CM | POA: Diagnosis not present

## 2022-12-11 DIAGNOSIS — N2581 Secondary hyperparathyroidism of renal origin: Secondary | ICD-10-CM | POA: Diagnosis not present

## 2022-12-11 NOTE — Telephone Encounter (Signed)
Attempted to reach patient to schedule surgery. Left VM at contact numbers for patient to return call.

## 2022-12-11 NOTE — Progress Notes (Signed)
 Carelink Summary Report / Loop Recorder 

## 2022-12-12 ENCOUNTER — Other Ambulatory Visit: Payer: Self-pay

## 2022-12-12 DIAGNOSIS — N186 End stage renal disease: Secondary | ICD-10-CM

## 2022-12-12 NOTE — Telephone Encounter (Signed)
Patient returned call and scheduled for surgery on 7/29. Instructions given and patient voiced understanding.

## 2022-12-13 DIAGNOSIS — N2581 Secondary hyperparathyroidism of renal origin: Secondary | ICD-10-CM | POA: Diagnosis not present

## 2022-12-13 DIAGNOSIS — N186 End stage renal disease: Secondary | ICD-10-CM | POA: Diagnosis not present

## 2022-12-13 DIAGNOSIS — Z992 Dependence on renal dialysis: Secondary | ICD-10-CM | POA: Diagnosis not present

## 2022-12-16 DIAGNOSIS — N186 End stage renal disease: Secondary | ICD-10-CM | POA: Diagnosis not present

## 2022-12-16 DIAGNOSIS — Z992 Dependence on renal dialysis: Secondary | ICD-10-CM | POA: Diagnosis not present

## 2022-12-16 DIAGNOSIS — N2581 Secondary hyperparathyroidism of renal origin: Secondary | ICD-10-CM | POA: Diagnosis not present

## 2022-12-18 DIAGNOSIS — N2581 Secondary hyperparathyroidism of renal origin: Secondary | ICD-10-CM | POA: Diagnosis not present

## 2022-12-18 DIAGNOSIS — Z992 Dependence on renal dialysis: Secondary | ICD-10-CM | POA: Diagnosis not present

## 2022-12-18 DIAGNOSIS — N186 End stage renal disease: Secondary | ICD-10-CM | POA: Diagnosis not present

## 2022-12-19 ENCOUNTER — Encounter (HOSPITAL_COMMUNITY): Payer: Self-pay | Admitting: Vascular Surgery

## 2022-12-20 DIAGNOSIS — Z992 Dependence on renal dialysis: Secondary | ICD-10-CM | POA: Diagnosis not present

## 2022-12-20 DIAGNOSIS — N2581 Secondary hyperparathyroidism of renal origin: Secondary | ICD-10-CM | POA: Diagnosis not present

## 2022-12-20 DIAGNOSIS — N186 End stage renal disease: Secondary | ICD-10-CM | POA: Diagnosis not present

## 2022-12-23 ENCOUNTER — Encounter (HOSPITAL_COMMUNITY)
Admission: RE | Disposition: A | Discharge status: Home / Self Care | Payer: Self-pay | Source: Home / Self Care | Attending: Vascular Surgery

## 2022-12-23 ENCOUNTER — Other Ambulatory Visit: Payer: Self-pay

## 2022-12-23 ENCOUNTER — Encounter (HOSPITAL_COMMUNITY): Payer: Self-pay | Admitting: Vascular Surgery

## 2022-12-23 ENCOUNTER — Ambulatory Visit (HOSPITAL_COMMUNITY)
Admission: RE | Admit: 2022-12-23 | Discharge: 2022-12-23 | Disposition: A | Discharge status: Home / Self Care | Payer: 59 | Attending: Vascular Surgery | Admitting: Vascular Surgery

## 2022-12-23 ENCOUNTER — Telehealth: Payer: Self-pay | Admitting: Vascular Surgery

## 2022-12-23 ENCOUNTER — Ambulatory Visit (HOSPITAL_COMMUNITY): Payer: 59 | Admitting: Anesthesiology

## 2022-12-23 ENCOUNTER — Ambulatory Visit (HOSPITAL_BASED_OUTPATIENT_CLINIC_OR_DEPARTMENT_OTHER): Payer: 59 | Admitting: Anesthesiology

## 2022-12-23 DIAGNOSIS — I120 Hypertensive chronic kidney disease with stage 5 chronic kidney disease or end stage renal disease: Secondary | ICD-10-CM | POA: Diagnosis not present

## 2022-12-23 DIAGNOSIS — Z09 Encounter for follow-up examination after completed treatment for conditions other than malignant neoplasm: Secondary | ICD-10-CM | POA: Diagnosis not present

## 2022-12-23 DIAGNOSIS — E1122 Type 2 diabetes mellitus with diabetic chronic kidney disease: Secondary | ICD-10-CM | POA: Insufficient documentation

## 2022-12-23 DIAGNOSIS — Z8673 Personal history of transient ischemic attack (TIA), and cerebral infarction without residual deficits: Secondary | ICD-10-CM | POA: Diagnosis not present

## 2022-12-23 DIAGNOSIS — Z992 Dependence on renal dialysis: Secondary | ICD-10-CM | POA: Diagnosis not present

## 2022-12-23 DIAGNOSIS — N186 End stage renal disease: Secondary | ICD-10-CM

## 2022-12-23 DIAGNOSIS — K219 Gastro-esophageal reflux disease without esophagitis: Secondary | ICD-10-CM | POA: Diagnosis not present

## 2022-12-23 DIAGNOSIS — E785 Hyperlipidemia, unspecified: Secondary | ICD-10-CM | POA: Insufficient documentation

## 2022-12-23 DIAGNOSIS — N185 Chronic kidney disease, stage 5: Secondary | ICD-10-CM

## 2022-12-23 DIAGNOSIS — Z79899 Other long term (current) drug therapy: Secondary | ICD-10-CM | POA: Insufficient documentation

## 2022-12-23 HISTORY — DX: Unspecified background retinopathy: H35.00

## 2022-12-23 HISTORY — PX: BASCILIC VEIN TRANSPOSITION: SHX5742

## 2022-12-23 LAB — POCT I-STAT, CHEM 8
BUN: 46 mg/dL — ABNORMAL HIGH (ref 8–23)
Calcium, Ion: 1.06 mmol/L — ABNORMAL LOW (ref 1.15–1.40)
Chloride: 99 mmol/L (ref 98–111)
Creatinine, Ser: 9.6 mg/dL — ABNORMAL HIGH (ref 0.61–1.24)
Glucose, Bld: 100 mg/dL — ABNORMAL HIGH (ref 70–99)
HCT: 31 % — ABNORMAL LOW (ref 39.0–52.0)
Hemoglobin: 10.5 g/dL — ABNORMAL LOW (ref 13.0–17.0)
Potassium: 3.9 mmol/L (ref 3.5–5.1)
Sodium: 133 mmol/L — ABNORMAL LOW (ref 135–145)
TCO2: 25 mmol/L (ref 22–32)

## 2022-12-23 LAB — GLUCOSE, CAPILLARY
Glucose-Capillary: 84 mg/dL (ref 70–99)
Glucose-Capillary: 130 mg/dL — ABNORMAL HIGH (ref 70–99)
Glucose-Capillary: 143 mg/dL — ABNORMAL HIGH (ref 70–99)

## 2022-12-23 SURGERY — TRANSPOSITION, VEIN, BASILIC
Anesthesia: Regional | Laterality: Right

## 2022-12-23 MED ORDER — LIDOCAINE 2% (20 MG/ML) 5 ML SYRINGE
INTRAMUSCULAR | Status: AC
  Filled 2022-12-23: qty 5

## 2022-12-23 MED ORDER — OXYCODONE-ACETAMINOPHEN 5-325 MG PO TABS: 1.0000 | ORAL_TABLET | Freq: Four times a day (QID) | ORAL | 0 refills | Status: DC | PRN

## 2022-12-23 MED ORDER — CARVEDILOL 3.125 MG PO TABS
6.2500 mg | ORAL_TABLET | Freq: Once | ORAL | Status: AC
  Administered 2022-12-23: 6.25 mg via ORAL

## 2022-12-23 MED ORDER — CHLORHEXIDINE GLUCONATE 0.12 % MT SOLN
15.0000 mL | Freq: Once | OROMUCOSAL | Status: AC
  Administered 2022-12-23: 15 mL via OROMUCOSAL
  Filled 2022-12-23: qty 15

## 2022-12-23 MED ORDER — PROPOFOL 500 MG/50ML IV EMUL
INTRAVENOUS | Status: DC | PRN
  Administered 2022-12-23: 75 ug/kg/min via INTRAVENOUS

## 2022-12-23 MED ORDER — INSULIN ASPART 100 UNIT/ML IJ SOLN: 0.0000 [IU] | INTRAMUSCULAR | Status: DC | PRN

## 2022-12-23 MED ORDER — CHLORHEXIDINE GLUCONATE 4 % EX SOLN: 60.0000 mL | Freq: Once | CUTANEOUS | Status: DC

## 2022-12-23 MED ORDER — ONDANSETRON HCL 4 MG/2ML IJ SOLN
INTRAMUSCULAR | Status: DC | PRN
  Administered 2022-12-23: 4 mg via INTRAVENOUS

## 2022-12-23 MED ORDER — HEPARIN SODIUM (PORCINE) 1000 UNIT/ML IJ SOLN
INTRAMUSCULAR | Status: DC | PRN
Start: 2022-12-23 — End: 2022-12-23
  Administered 2022-12-23: 3000 [IU] via INTRAVENOUS

## 2022-12-23 MED ORDER — OXYCODONE HCL 5 MG PO TABS: 5.0000 mg | ORAL_TABLET | Freq: Once | ORAL | Status: DC | PRN

## 2022-12-23 MED ORDER — BUPIVACAINE-EPINEPHRINE (PF) 0.5% -1:200000 IJ SOLN
INTRAMUSCULAR | Status: DC | PRN
  Administered 2022-12-23: 15 mL via PERINEURAL

## 2022-12-23 MED ORDER — CARVEDILOL 3.125 MG PO TABS
ORAL_TABLET | ORAL | Status: AC
  Filled 2022-12-23: qty 2

## 2022-12-23 MED ORDER — ORAL CARE MOUTH RINSE: 15.0000 mL | Freq: Once | OROMUCOSAL | Status: AC

## 2022-12-23 MED ORDER — FENTANYL CITRATE (PF) 100 MCG/2ML IJ SOLN: 25.0000 ug | INTRAMUSCULAR | Status: DC | PRN

## 2022-12-23 MED ORDER — FENTANYL CITRATE (PF) 250 MCG/5ML IJ SOLN
INTRAMUSCULAR | Status: AC
  Filled 2022-12-23: qty 5

## 2022-12-23 MED ORDER — DEXMEDETOMIDINE HCL IN NACL 80 MCG/20ML IV SOLN
INTRAVENOUS | Status: DC | PRN
  Administered 2022-12-23 (×2): 8 ug via INTRAVENOUS

## 2022-12-23 MED ORDER — HEPARIN 6000 UNIT IRRIGATION SOLUTION
Status: DC | PRN
  Administered 2022-12-23: 1

## 2022-12-23 MED ORDER — CEFAZOLIN SODIUM-DEXTROSE 2-4 GM/100ML-% IV SOLN
2.0000 g | INTRAVENOUS | Status: DC
  Filled 2022-12-23: qty 100

## 2022-12-23 MED ORDER — HEPARIN 6000 UNIT IRRIGATION SOLUTION
Status: AC
  Filled 2022-12-23: qty 500

## 2022-12-23 MED ORDER — OXYCODONE HCL 5 MG/5ML PO SOLN: 5.0000 mg | Freq: Once | ORAL | Status: DC | PRN

## 2022-12-23 MED ORDER — PROPOFOL 10 MG/ML IV BOLUS
INTRAVENOUS | Status: DC | PRN
Start: 2022-12-23 — End: 2022-12-23
  Administered 2022-12-23: 25 mg via INTRAVENOUS
  Administered 2022-12-23: 15 mg via INTRAVENOUS
  Administered 2022-12-23 (×5): 25 mg via INTRAVENOUS

## 2022-12-23 MED ORDER — ACETAMINOPHEN 160 MG/5ML PO SOLN: 325.0000 mg | ORAL | Status: DC | PRN

## 2022-12-23 MED ORDER — SODIUM CHLORIDE 0.9 % IV SOLN: INTRAVENOUS | Status: DC

## 2022-12-23 MED ORDER — ACETAMINOPHEN 10 MG/ML IV SOLN: 1000.0000 mg | Freq: Once | INTRAVENOUS | Status: DC | PRN

## 2022-12-23 MED ORDER — FENTANYL CITRATE (PF) 250 MCG/5ML IJ SOLN
INTRAMUSCULAR | Status: DC | PRN
  Administered 2022-12-23: 25 ug via INTRAVENOUS

## 2022-12-23 MED ORDER — 0.9 % SODIUM CHLORIDE (POUR BTL) OPTIME
TOPICAL | Status: DC | PRN
  Administered 2022-12-23: 1000 mL

## 2022-12-23 MED ORDER — ACETAMINOPHEN 325 MG PO TABS: 325.0000 mg | ORAL_TABLET | ORAL | Status: DC | PRN

## 2022-12-23 MED ORDER — PROPOFOL 10 MG/ML IV BOLUS
INTRAVENOUS | Status: AC
  Filled 2022-12-23: qty 20

## 2022-12-23 MED ORDER — LIDOCAINE 2% (20 MG/ML) 5 ML SYRINGE
INTRAMUSCULAR | Status: DC | PRN
  Administered 2022-12-23: 60 mg via INTRAVENOUS

## 2022-12-23 MED ORDER — PHENYLEPHRINE HCL-NACL 20-0.9 MG/250ML-% IV SOLN
INTRAVENOUS | Status: DC | PRN
  Administered 2022-12-23: 25 ug/min via INTRAVENOUS

## 2022-12-23 SURGICAL SUPPLY — 39 items
ADH SKN CLS APL DERMABOND .7 (GAUZE/BANDAGES/DRESSINGS) ×1
AGENT HMST SPONGE THK3/8 (HEMOSTASIS)
ARMBAND PINK RESTRICT EXTREMIT (MISCELLANEOUS) ×2 IMPLANT
BAG COUNTER SPONGE SURGICOUNT (BAG) ×1 IMPLANT
BAG SPNG CNTER NS LX DISP (BAG)
BLADE CLIPPER SURG (BLADE) ×1 IMPLANT
CANISTER SUCT 3000ML PPV (MISCELLANEOUS) ×1 IMPLANT
CLIP TI MEDIUM 6 (CLIP) ×1 IMPLANT
CLIP TI WIDE RED SMALL 6 (CLIP) ×1 IMPLANT
COVER PROBE W GEL 5X96 (DRAPES) ×1 IMPLANT
DERMABOND ADVANCED .7 DNX12 (GAUZE/BANDAGES/DRESSINGS) ×1 IMPLANT
ELECT REM PT RETURN 9FT ADLT (ELECTROSURGICAL) ×1
ELECTRODE REM PT RTRN 9FT ADLT (ELECTROSURGICAL) ×1 IMPLANT
GLOVE BIO SURGEON STRL SZ 6.5 (GLOVE) IMPLANT
GLOVE BIO SURGEON STRL SZ7.5 (GLOVE) ×1 IMPLANT
GLOVE BIOGEL PI IND STRL 8 (GLOVE) ×1 IMPLANT
GOWN STRL REUS W/ TWL LRG LVL3 (GOWN DISPOSABLE) ×2 IMPLANT
GOWN STRL REUS W/ TWL XL LVL3 (GOWN DISPOSABLE) ×2 IMPLANT
GOWN STRL REUS W/TWL LRG LVL3 (GOWN DISPOSABLE) ×3
GOWN STRL REUS W/TWL XL LVL3 (GOWN DISPOSABLE) ×1
HEMOSTAT SPONGE AVITENE ULTRA (HEMOSTASIS) IMPLANT
KIT BASIN OR (CUSTOM PROCEDURE TRAY) ×1 IMPLANT
KIT TURNOVER KIT B (KITS) ×1 IMPLANT
LOOP VASCULAR MINI 18 RED (MISCELLANEOUS) ×1
NS IRRIG 1000ML POUR BTL (IV SOLUTION) ×1 IMPLANT
PACK CV ACCESS (CUSTOM PROCEDURE TRAY) ×1 IMPLANT
PAD ARMBOARD 7.5X6 YLW CONV (MISCELLANEOUS) ×2 IMPLANT
SLING ARM FOAM STRAP LRG (SOFTGOODS) IMPLANT
SLING ARM FOAM STRAP MED (SOFTGOODS) IMPLANT
SPIKE FLUID TRANSFER (MISCELLANEOUS) ×1 IMPLANT
SUT MNCRL AB 4-0 PS2 18 (SUTURE) ×1 IMPLANT
SUT PROLENE 6 0 BV (SUTURE) ×1 IMPLANT
SUT PROLENE 7 0 BV 1 (SUTURE) IMPLANT
SUT VIC AB 3-0 SH 27 (SUTURE) ×1
SUT VIC AB 3-0 SH 27X BRD (SUTURE) ×1 IMPLANT
TOWEL GREEN STERILE (TOWEL DISPOSABLE) ×1 IMPLANT
UNDERPAD 30X36 HEAVY ABSORB (UNDERPADS AND DIAPERS) ×1 IMPLANT
VASCULAR TIE MINI RED 18IN STL (MISCELLANEOUS) IMPLANT
WATER STERILE IRR 1000ML POUR (IV SOLUTION) ×1 IMPLANT

## 2022-12-23 NOTE — H&P (Signed)
History and Physical Interval Note:  12/23/2022 8:05 AM  Douglas Briggs  has presented today for surgery, with the diagnosis of ESRD.  The various methods of treatment have been discussed with the patient and family. After consideration of risks, benefits and other options for treatment, the patient has consented to  Procedure(s): RIGHT ARM ARTERIOVENOUS (AV) FISTULA VERSUS GRAFT CREATION (Right) as a surgical intervention.  The patient's history has been reviewed, patient examined, no change in status, stable for surgery.  I have reviewed the patient's chart and labs.  Questions were answered to the patient's satisfaction.    Right arm AVF vs graft.  Discussed would avoid left arm for now given left chest wall implant with loop recorder.  Discussed if use right arm basilic vein will be two stages.    Cephus Shelling          Patient name: Douglas Briggs   MRN: [id number]        DOB: 02/16/1961        Sex: male   REASON FOR CONSULT: Evaluate for permanent hemodialysis access   HPI: Douglas Briggs is a 62 y.o. male, with history of CKD now ESRD, diabetes, hypertension, hyperlipidemia that presents for evaluation of permanent hemodialysis access.  He is well-known to vascular surgery and has previously been followed for asymptomatic PAD.  States has been on dialysis for 3 weeks.  Currently using a right IJ TDC.  No access in the past.  He has a left chest wall implant from a implantable loop recorder placed about 2 years ago.  States he is right-hand dominant.       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 Dr. Lyman Bishop - seen 11/24/19   Protein-calorie malnutrition, mild (HCC) 09/11/2020   Restless leg syndrome 11/14/2016   Right sided numbness     Stroke (HCC)     Stroke-like symptoms 10/29/2019   TIA (transient ischemic attack) 10/29/2019   Tinea pedis of both feet 03/12/2017   Ulcer of left lower extremity with fat layer exposed (HCC) 01/20/2020   Uncontrolled type 2 diabetes mellitus with diabetic polyneuropathy, without long-term current use of insulin 11/12/2016               Past Surgical History:  Procedure Laterality Date   AMPUTATION Left 12/20/2019    Procedure: AMPUTATION 5TH TOE;  Surgeon: Terance Hart, MD;  Location: [facility] OR;  Service: Orthopedics;  Laterality: Left;   BUBBLE STUDY   09/22/2020    Procedure: BUBBLE STUDY;  Surgeon: Little Ishikawa, MD;  Location: [facility] ENDOSCOPY;  Service: Cardiovascular;;   CATARACT EXTRACTION Bilateral 02/2021   LOOP RECORDER INSERTION N/A 09/22/2020    Procedure: LOOP RECORDER INSERTION;  Surgeon: Elberta Fortis,  Andree Coss, MD;  Location: [facility] INVASIVE CV LAB;  Service: Cardiovascular;  Laterality: N/A;   TEE WITHOUT CARDIOVERSION N/A 09/22/2020    Procedure: TRANSESOPHAGEAL ECHOCARDIOGRAM (TEE);  Surgeon: Little Ishikawa, MD;  Location: [facility] ENDOSCOPY;  Service: Cardiovascular;  Laterality: N/A;  LOOP               Family History  Problem Relation Age of Onset   Hypertension Father     Cancer Father     Diabetes Paternal Aunt     Heart disease Neg Hx            SOCIAL HISTORY: Social History         Socioeconomic History   Marital status: Single      Spouse name:  Not on file   Number of children: Not on file   Years of education: Not on file   Highest education level: Not on file  Occupational History   Not on file  Tobacco Use   Smoking status: Never   Smokeless tobacco: Never  Vaping Use   Vaping status: Never Used  Substance and Sexual Activity   Alcohol use: No   Drug use: Never   Sexual activity: Not on file  Other Topics Concern   Not on file  Social History Narrative   Not on file    Social Determinants of Health        Financial Resource Strain: Low Risk  (08/16/2022)    Overall Financial Resource Strain (CARDIA)     Difficulty of Paying Living Expenses: Not hard at all  Food Insecurity: No Food Insecurity (08/16/2022)    Hunger Vital Sign     Worried About Running Out of Food in the Last Year: Never true     Ran Out of Food in the Last Year: Never true  Transportation Needs: No Transportation Needs (08/16/2022)    PRAPARE - Therapist, art (Medical): No     Lack of Transportation (Non-Medical): No  Physical Activity: Insufficiently Active (08/16/2022)    Exercise Vital Sign     Days of Exercise per Week: 2 days     Minutes of Exercise per Session: 30 min  Stress: No Stress Concern Present (08/16/2022)    [employer] of Occupational Health - Occupational Stress Questionnaire     Feeling of Stress : Not at all  Social Connections: Moderately Isolated (08/16/2022)    Social Connection and Isolation Panel [NHANES]     Frequency of Communication with Friends and Family: More than three times a week     Frequency of Social Gatherings with Friends and Family: More than three times a week     Attends Religious Services: More than 4 times per year     Active Member of [employer] or Organizations: No     Attends Banker Meetings: Never     Marital Status: Divorced  Catering manager Violence: Not At Risk (08/16/2022)    Humiliation, Afraid, Rape, and Kick questionnaire     Fear of Current or  Ex-Partner: No     Emotionally Abused: No     Physically Abused: No     Sexually Abused: No      Allergies  No Known Allergies           Current Outpatient Medications  Medication Sig Dispense Refill   amLODipine (NORVASC) 10 MG tablet Take 10 mg by mouth daily.       aspirin EC 81 MG  tablet Take 81 mg by mouth daily. Swallow whole.       atorvastatin (LIPITOR) 40 MG tablet Take 2 tablets (80 mg total) by mouth daily in the afternoon. 180 tablet 1   carvedilol (COREG) 6.25 MG tablet Take 1 tablet (6.25 mg total) by mouth 2 (two) times daily with a meal. 180 tablet 1   ezetimibe (ZETIA) 10 MG tablet Take 10 mg by mouth daily.       famotidine (PEPCID) 20 MG tablet Take 20 mg by mouth 2 (two) times daily as needed for heartburn or indigestion.       furosemide (LASIX) 20 MG tablet Take 1 tablet (20 mg total) by mouth daily. 90 tablet 1   hydrALAZINE (APRESOLINE) 50 MG tablet Take 1 tablet (50 mg total) by mouth 3 (three) times daily. 270 tablet 1   ketorolac (ACULAR) 0.5 % ophthalmic solution SMARTSIG:In Eye(s)       LOKELMA 10 g PACK packet Take by mouth.       prednisoLONE acetate (PRED FORTE) 1 % ophthalmic suspension         sodium bicarbonate 650 MG tablet Take 1,300 mg by mouth 3 (three) times daily.          No current facility-administered medications for this visit.        REVIEW OF SYSTEMS:  [X]  denotes positive finding, [ ]  denotes negative finding Cardiac   Comments:  Chest pain or chest pressure:      Shortness of breath upon exertion:      Short of breath when lying flat:      Irregular heart rhythm:             Vascular      Pain in calf, thigh, or hip brought on by ambulation:      Pain in feet at night that wakes you up from your sleep:       Blood clot in your veins:      Leg swelling:              Pulmonary      Oxygen at home:      Productive cough:       Wheezing:              Neurologic      Sudden weakness in arms or legs:       Sudden numbness  in arms or legs:       Sudden onset of difficulty speaking or slurred speech:      Temporary loss of vision in one eye:       Problems with dizziness:              Gastrointestinal      Blood in stool:       Vomited blood:              Genitourinary      Burning when urinating:       Blood in urine:             Psychiatric      Major depression:              Hematologic      Bleeding problems:      Problems with blood clotting too easily:             Skin      Rashes or ulcers:             Constitutional  Fever or chills:          PHYSICAL EXAM: There were no vitals filed for this visit.   GENERAL: The patient is a well-nourished male, in no acute distress. The vital signs are documented above. CARDIAC: There is a regular rate and rhythm.  VASCULAR:  Bilateral brachial and radial pulses palpable Right IJ TDC Left chest wall implantable loop recorder PULMONARY: No respiratory distress. ABDOMEN: Soft and non-tender. MUSCULOSKELETAL: There are no major deformities or cyanosis. NEUROLOGIC: No focal weakness or paresthesias are detected. SKIN: There are no ulcers or rashes noted. PSYCHIATRIC: The patient has a normal affect.   DATA:    Vein mapping shows a marginal basilic vein in the right arm with a small cephalic vein   Assessment/Plan:   62 y.o. male, with history of CKD now ESRD, diabetes, hypertension, hyperlipidemia that presents for evaluation of permanent hemodialysis access.  Discussed that we normally recommend placement in the nondominant arm which would be his left arm.  Unfortunately has an implantable loop recorder in the left chest that I think would be high risk for left central venous stenosis.  As a result, I recommended right arm access.  His right cephalic vein is small.  I discussed he has a marginal basilic vein that may be usable.  Discussed this being done in two stages.  Discussed if he has no usable vein we will place AV graft given he is  currently on dialysis.  Risks benefits discussed including risk of bleeding, infection, failure to mature, steal syndrome.     Cephus Shelling, MD [facility] [place] Office: [phone]

## 2022-12-23 NOTE — Op Note (Signed)
OPERATIVE NOTE  DATE: December 23, 2022  PROCEDURE: Right first stage basilic vein transposition (brachiobasilic arteriovenous fistula) placement  PRE-OPERATIVE DIAGNOSIS: End stage renal disease  POST-OPERATIVE DIAGNOSIS: same  SURGEON: Cephus Shelling, MD  ASSISTANT(S): Nathanial Rancher, PA  ANESTHESIA: regional  ESTIMATED BLOOD LOSS: Minimal  FINDING(S): 1.  Basilic vein: 4 mm, acceptable 2.  Brachial artery: 4 mm, disease free 3.  Venous outflow: palpable thrill  4.  Radial flow: palpable radial pulse  SPECIMEN(S):  none  INDICATIONS:   Douglas Briggs is a 62 y.o. male who presents with end stage renal disease and the need for permanent hemodialysis access.  The patient is scheduled for right  arm AVF versus AVG.  The patient is aware the risks include but are not limited to: bleeding, infection, steal syndrome, nerve damage, ischemic monomelic neuropathy, failure to mature, and need for additional procedures.  The patient is aware of the risks of the procedure and elects to proceed forward.  An assistant was needed to sew the anastomosis.   DESCRIPTION: After full informed written consent was obtained from the patient, the patient was brought back to the operating room and placed supine upon the operating table.  Prior to induction, the patient received IV antibiotics.   After obtaining adequate anesthesia, the patient was then prepped and draped in the standard fashion for a right arm access procedure.  I turned my attention first to identifying the patient's cephalic vein and this was small in the upper arm.  The basilic vein was much larger as demonstrated on vein mapping.    I elected for a brachiobasilic fistula.  Using SonoSite guidance, the location of these vessels were marked out on the skin.  I made a transverse incision at the level of the antecubitum and dissected through the subcutaneous tissue and fascia to gain exposure of the brachial artery.  This was noted to  be 4 mm in diameter externally.  This was dissected out proximally and distally and controlled with vessel loops .  I then dissected out the basilic vein.  This was noted to be 4 mm in diameter externally.  The distal segment of the vein was ligated with a  2-0 silk, and the vein was transected.  The proximal segment was interrogated with serial dilators.  The vein accepted up to a 5 mm dilator without any difficulty.  I then instilled the heparinized saline into the vein and clamped it.  At this point, I reset my exposure of the brachial artery.  The patient was given 3,000 units IV heparin.  I then placed the artery under tension proximally and distally.  I made an arteriotomy with a #11 blade, and then I extended the arteriotomy with a Potts scissor.  I injected heparinized saline proximal and distal to this arteriotomy.  The vein was then sewn to the artery in an end-to-side configuration with a running stitch of 6-0 Prolene with the help of my assistant.  Prior to completing this anastomosis, I allowed the vein and artery to backbleed.  There was no evidence of clot from any vessels.  I completed the anastomosis in the usual fashion and then released all vessel loops and clamps.    There was a palpable thrill in the venous outflow, and there was a palpable radial pulse.  At this point, I irrigated out the surgical wound.  There was no further active bleeding.  The subcutaneous tissue was reapproximated with a running stitch of 3-0 Vicryl.  The skin was then reapproximated with a running subcuticular stitch of 4-0 Monocryl.  The skin was then cleaned, dried, and reinforced with Dermabond.  The patient tolerated this procedure well.   COMPLICATIONS: None  CONDITION: Stable  Cephus Shelling MD [facility] Office: [phone]  Cephus Shelling   12/23/2022, 10:43 AM

## 2022-12-23 NOTE — Anesthesia Preprocedure Evaluation (Signed)
Anesthesia Evaluation  Patient identified by MRN, date of birth, ID band Patient awake    Reviewed: Allergy & Precautions, NPO status , Patient's Chart, lab work & pertinent test results  Airway Mallampati: II  TM Distance: >3 FB Neck ROM: Full    Dental  (+) Teeth Intact, Dental Advisory Given   Pulmonary    breath sounds clear to auscultation       Cardiovascular hypertension, Pt. on medications and Pt. on home beta blockers + Peripheral Vascular Disease   Rhythm:Regular Rate:Normal     Neuro/Psych TIACVA    GI/Hepatic Neg liver ROS,GERD  Medicated,,  Endo/Other  diabetes    Renal/GU Renal disease     Musculoskeletal   Abdominal   Peds  Hematology   Anesthesia Other Findings   Reproductive/Obstetrics                             Anesthesia Physical Anesthesia Plan  ASA: 3  Anesthesia Plan: Regional   Post-op Pain Management: Minimal or no pain anticipated   Induction: Intravenous  PONV Risk Score and Plan: 0 and Propofol infusion  Airway Management Planned: Natural Airway and Nasal Cannula  Additional Equipment: None  Intra-op Plan:   Post-operative Plan:   Informed Consent: I have reviewed the patients History and Physical, chart, labs and discussed the procedure including the risks, benefits and alternatives for the proposed anesthesia with the patient or authorized representative who has indicated his/her understanding and acceptance.       Plan Discussed with: CRNA  Anesthesia Plan Comments:        Anesthesia Quick Evaluation

## 2022-12-23 NOTE — Transfer of Care (Signed)
Immediate Anesthesia Transfer of Care Note  Patient: Douglas Briggs  Procedure(s) Performed: RIGHT FIRST STAGE BRACHIOBASILIC VEIN TRANSPOSITION (Right)  Patient Location: PACU  Anesthesia Type:MAC combined with regional for post-op pain  Level of Consciousness: awake, alert , and oriented  Airway & Oxygen Therapy: Patient Spontanous Breathing  Post-op Assessment: Report given to RN and Post -op Vital signs reviewed and stable  Post vital signs: Reviewed and stable  Last Vitals:  Vitals Value Taken Time  BP 112/73   Temp    Pulse    Resp    SpO2 99     Last Pain:  Vitals:   12/23/22 0821  TempSrc:   PainSc: 0-No pain         Complications: No notable events documented.

## 2022-12-23 NOTE — Anesthesia Procedure Notes (Signed)
Anesthesia Regional Block: Supraclavicular block   Pre-Anesthetic Checklist: , timeout performed,  Correct Patient, Correct Site, Correct Laterality,  Correct Procedure, Correct Position, site marked,  Risks and benefits discussed,  Surgical consent,  Pre-op evaluation,  At surgeon's request and post-op pain management  Laterality: Right  Prep: chloraprep       Needles:  Injection technique: Single-shot  Needle Type: Echogenic Stimulator Needle     Needle Length: 9cm  Needle Gauge: 21     Additional Needles:   Procedures:,,,, ultrasound used (permanent image in chart),,    Narrative:  Start time: 12/23/2022 9:10 AM End time: 12/23/2022 9:15 AM Injection made incrementally with aspirations every 5 mL.  Performed by: Personally  Anesthesiologist: Shelton Silvas, MD  Additional Notes: Discussed risks and benefits of the nerve block in detail, including but not limited vascular injury, permanent nerve damage and infection.   Patient tolerated the procedure well. Local anesthetic introduced in an incremental fashion under minimal resistance after negative aspirations. No paresthesias were elicited. After completion of the procedure, no acute issues were identified and patient continued to be monitored by RN.

## 2022-12-23 NOTE — Discharge Instructions (Signed)
[facility]  Discharge Instructions  AV Fistula or Graft Surgery for Dialysis Access  Please refer to the following instructions for your post-procedure care. Your surgeon or physician assistant will discuss any changes with you.  Activity  You may drive the day following your surgery, if you are comfortable and no longer taking prescription pain medication. Resume full activity as the soreness in your incision resolves.  Bathing/Showering  You may shower after you go home. Keep your incision dry for 48 hours. Do not soak in a bathtub, hot tub, or swim until the incision heals completely. You may not shower if you have a hemodialysis catheter.  Incision Care  Clean your incision with mild soap and water after 48 hours. Pat the area dry with a clean towel. You do not need a bandage unless otherwise instructed. Do not apply any ointments or creams to your incision. You may have skin glue on your incision. Do not peel it off. It will come off on its own in about one week. Your arm may swell a bit after surgery. To reduce swelling use pillows to elevate your arm so it is above your heart. Your doctor will tell you if you need to lightly wrap your arm with an ACE bandage.  Diet  Resume your normal diet. There are not special food restrictions following this procedure. In order to heal from your surgery, it is CRITICAL to get adequate nutrition. Your body requires vitamins, minerals, and protein. Vegetables are the best source of vitamins and minerals. Vegetables also provide the perfect balance of protein. Processed food has little nutritional value, so try to avoid this.  Medications  Resume taking all of your medications. If your incision is causing pain, you may take over-the counter pain relievers such as acetaminophen (Tylenol). If you were prescribed a stronger pain medication, please be aware these medications can cause nausea and constipation. Prevent  nausea by taking the medication with a snack or meal. Avoid constipation by drinking plenty of fluids and eating foods with high amount of fiber, such as fruits, vegetables, and grains.  Do not take Tylenol if you are taking prescription pain medications.  Follow up Your surgeon may want to see you in the office following your access surgery. If so, this will be arranged at the time of your surgery.  Please call us immediately for any of the following conditions:  Increased pain, redness, drainage (pus) from your incision site Fever of 101 degrees or higher Severe or worsening pain at your incision site Hand pain or numbness.  Reduce your risk of vascular disease:  Stop smoking. If you would like help, call [facility] at 1-800-QUIT-NOW (270-281-8194) or [place] at (760)855-5120  Manage your cholesterol Maintain a desired weight Control your diabetes Keep your blood pressure down  Dialysis  It will take several weeks to several months for your new dialysis access to be ready for use. Your surgeon will determine when it is okay to use it. Your nephrologist will continue to direct your dialysis. You can continue to use your Permcath until your new access is ready for use.   12/23/2022 Douglas Briggs [id number] 12-13-1960  Surgeon(s): Cephus Shelling, MD  Procedure(s): RIGHT FIRST STAGE BRACHIOBASILIC VEIN TRANSPOSITION   May stick graft immediately   May stick graft on designated area only:   X Do not stick right AV fistula for 12 weeks    If you have any questions, please call the office at  [phone]. 

## 2022-12-23 NOTE — Anesthesia Postprocedure Evaluation (Signed)
Anesthesia Post Note  Patient: Douglas Briggs  Procedure(s) Performed: RIGHT FIRST STAGE BRACHIOBASILIC VEIN TRANSPOSITION (Right)     Patient location during evaluation: PACU Anesthesia Type: Regional Level of consciousness: awake and alert Pain management: pain level controlled Vital Signs Assessment: post-procedure vital signs reviewed and stable Respiratory status: spontaneous breathing, nonlabored ventilation, respiratory function stable and patient connected to nasal cannula oxygen Cardiovascular status: stable and blood pressure returned to baseline Postop Assessment: no apparent nausea or vomiting Anesthetic complications: no  No notable events documented.  Last Vitals:  Vitals:   12/23/22 1100 12/23/22 1115  BP: 112/70 123/71  Pulse: 71 67  Resp: 13 13  Temp:  36.6 C  SpO2: 99% 100%    Last Pain:  Vitals:   12/23/22 1052  TempSrc:   PainSc: 0-No pain                 Shelton Silvas

## 2022-12-23 NOTE — Anesthesia Procedure Notes (Signed)
Procedure Name: MAC Date/Time: 12/23/2022 9:41 AM  Performed by: Aundria Rud, CRNAPre-anesthesia Checklist: Patient identified, Emergency Drugs available, Suction available and Patient being monitored Patient Re-evaluated:Patient Re-evaluated prior to induction Oxygen Delivery Method: Simple face mask Preoxygenation: Pre-oxygenation with 100% oxygen Induction Type: IV induction Placement Confirmation: positive ETCO2 and CO2 detector Dental Injury: Teeth and Oropharynx as per pre-operative assessment

## 2022-12-23 NOTE — Telephone Encounter (Signed)
-----   Message from Graceann Congress sent at 12/23/2022 10:41 AM EDT ----- S/p Right brachiobasilic AV fistula by Dr. Chestine Spore. Needs follow up in 4-6 weeks with fistula duplex. thanks

## 2022-12-23 NOTE — Telephone Encounter (Signed)
Pt is still in hospital

## 2022-12-24 ENCOUNTER — Encounter (HOSPITAL_COMMUNITY): Payer: Self-pay | Admitting: Vascular Surgery

## 2022-12-24 DIAGNOSIS — N2581 Secondary hyperparathyroidism of renal origin: Secondary | ICD-10-CM | POA: Diagnosis not present

## 2022-12-24 DIAGNOSIS — Z992 Dependence on renal dialysis: Secondary | ICD-10-CM | POA: Diagnosis not present

## 2022-12-24 DIAGNOSIS — N186 End stage renal disease: Secondary | ICD-10-CM | POA: Diagnosis not present

## 2022-12-25 DIAGNOSIS — Z992 Dependence on renal dialysis: Secondary | ICD-10-CM | POA: Diagnosis not present

## 2022-12-25 DIAGNOSIS — N186 End stage renal disease: Secondary | ICD-10-CM | POA: Diagnosis not present

## 2022-12-25 DIAGNOSIS — N2581 Secondary hyperparathyroidism of renal origin: Secondary | ICD-10-CM | POA: Diagnosis not present

## 2022-12-25 DIAGNOSIS — I120 Hypertensive chronic kidney disease with stage 5 chronic kidney disease or end stage renal disease: Secondary | ICD-10-CM | POA: Diagnosis not present

## 2022-12-30 ENCOUNTER — Encounter (HOSPITAL_COMMUNITY): Payer: 59

## 2022-12-30 DIAGNOSIS — I639 Cerebral infarction, unspecified: Secondary | ICD-10-CM | POA: Diagnosis not present

## 2022-12-30 NOTE — Telephone Encounter (Signed)
Appt has been scheduled.

## 2023-01-01 ENCOUNTER — Other Ambulatory Visit: Payer: Self-pay | Admitting: *Deleted

## 2023-01-01 DIAGNOSIS — N186 End stage renal disease: Secondary | ICD-10-CM

## 2023-01-09 NOTE — Progress Notes (Signed)
Carelink Summary Report / Loop Recorder 

## 2023-01-13 ENCOUNTER — Ambulatory Visit: Payer: 59 | Admitting: Internal Medicine

## 2023-01-21 ENCOUNTER — Ambulatory Visit (HOSPITAL_COMMUNITY): Payer: Medicare Other | Attending: Vascular Surgery

## 2023-01-30 ENCOUNTER — Telehealth: Payer: Self-pay

## 2023-01-30 NOTE — Telephone Encounter (Signed)
I spoke to [name], SW/ [facility] and she said that the patient is still with them and will be discharged home 02/03/2023.  I scheduled him a follow up appointment with Dr Laural Benes for 02/28/2023.  His availability is limited due to dialysis T/T/S.   She said she will put the visit information on his AVS

## 2023-02-03 ENCOUNTER — Encounter (HOSPITAL_COMMUNITY): Payer: Medicare Other

## 2023-02-03 DIAGNOSIS — I639 Cerebral infarction, unspecified: Secondary | ICD-10-CM | POA: Diagnosis not present

## 2023-02-03 LAB — CUP PACEART REMOTE DEVICE CHECK
Date Time Interrogation Session: 20240906230519
Implantable Pulse Generator Implant Date: 20220429

## 2023-02-05 ENCOUNTER — Telehealth: Payer: Self-pay

## 2023-02-05 NOTE — Transitions of Care (Post Inpatient/ED Visit) (Signed)
02/05/2023  Name: Douglas Briggs MRN: [id number] DOB: 11-17-1960  Today's TOC FU Call Status: Today's TOC FU Call Status:: Successful TOC FU Call Completed TOC FU Call Complete Date: 02/05/23 Patient's Name and Date of Birth confirmed.  Transition Care Management Follow-up Telephone Call Date of Discharge: 02/03/23 Discharge Facility: Other (Non-[facility] Facility) Name of Other (Non[facility]) Discharge Facility: [facility] Type of Discharge: Inpatient Admission Primary Inpatient Discharge Diagnosis:: "acute ischemic CVA" How have you been since you were released from the hospital?: Better (Pt voices he is doing good since coming home. Denies any acute sxs or concerns. Appetite good. LBM 3 days ago-only taken one dose of lLctulose &no Miralax-will take as ordered. He is walking w/ walker. HHPT arrived to home during call.) Any questions or concerns?: No  Items Reviewed: Did you receive and understand the discharge instructions provided?: Yes Medications obtained,verified, and reconciled?: Partial Review Completed Reason for Partial Mediation Review: pt had to end call early as he voiced that [facility] had arrived to home Any new allergies since your discharge?: No Dietary orders reviewed?: Yes Type of Diet Ordered:: low salt/heart healthy/renal Do you have support at home?: Yes People in Home: child(ren), adult Name of Support/Comfort Primary Source: Cinya-daughter  Medications Reviewed Today: Medications Reviewed Today     Reviewed by Charlyn Minerva, RN (Registered Nurse) on 02/05/23 at 1218  Med List Status: <None>   Medication Order Taking? Sig Documenting Provider Last Dose Status Informant  amLODipine (NORVASC) 10 MG tablet [id number]  Take 10 mg by mouth in the morning and at bedtime. [provider]  Active Self  aspirin EC 325 MG tablet [id number] Yes Take 325 mg by mouth daily. Start aspirin 325 mg daily on 04/18/2023(Take aspirin 81 mg daily and  Plavix 75 mg daily for 90 days (end date 04/17/2023), then take aspirin 325 mg daily indefinitely.) [provider]  Active Self  aspirin EC 81 MG tablet [id number] Yes Take 81 mg by mouth daily. Swallow whole. [provider] Taking Active Self           Med Note Etheleen Sia Dec 23, 2022  8:17 AM)    atorvastatin (LIPITOR) 40 MG tablet [id number] Yes Take 2 tablets (80 mg total) by mouth daily in the afternoon. Marcine Matar, MD Taking Active Self  carvedilol (COREG) 6.25 MG tablet [id number] Yes Take 1 tablet (6.25 mg total) by mouth 2 (two) times daily with a meal. Marcine Matar, MD Taking Active Self  chlorthalidone (HYGROTON) 25 MG tablet [id number]  Take 25 mg by mouth 2 (two) times daily. [provider]  Active Self  cholecalciferol (VITAMIN D3) 25 MCG (1000 UNIT) tablet [id number]  Take 1,000 Units by mouth daily. [provider]  Active Self  clopidogrel (PLAVIX) 75 MG tablet [id number] Yes Take 75 mg by mouth daily. Take 1 tablet (75 mg total) by mouth daily Indications: prevention for a blood clot going to the brain, treatment to prevent a heart attack, treatment to prevent peripheral artery thromboembolism. Take aspirin 81 mg daily and Plavix 75 mg daily for 90 days (end date 04/17/2023), then take aspirin 325 mg daily indefinitely. [provider] Taking Active Self  dapagliflozin propanediol (FARXIGA) 10 MG TABS tablet [id number]  Take 10 mg by mouth daily. [provider]  Active Self  ezetimibe (ZETIA) 10 MG tablet [id number]  Take 10 mg by mouth daily. [provider]  Active Self  famotidine (PEPCID) 20 MG tablet  [id number] Yes Take 20 mg by mouth 2 (two) times daily as needed for heartburn or indigestion. [provider] Taking Active Self  furosemide (LASIX) 20 MG tablet [id number]  Take 1 tablet (20 mg total) by mouth daily. Marcine Matar, MD  Active Self  hydrALAZINE (APRESOLINE) 50 MG  tablet [id number]  Take 1 tablet (50 mg total) by mouth 3 (three) times daily. Marcine Matar, MD  Active Self  ketorolac (ACULAR) 0.5 % ophthalmic solution [id number]  Place 1 drop into both eyes in the morning, at noon, and at bedtime. [provider]  Active Self  lactulose (CHRONULAC) 10 GM/15ML solution [id number] Yes Take 30 mLs by mouth 3 (three) times daily as needed for mild constipation. [provider] Taking Active Self  oxyCODONE-acetaminophen (PERCOCET) 5-325 MG tablet [id number]  Take 1 tablet by mouth every 6 (six) hours as needed for severe pain. Baglia, Corrina, PA-C  Active   polyethylene glycol (MIRALAX / GLYCOLAX) 17 g packet [id number] Yes Take 17 g by mouth daily. [provider]  Active Self  sodium bicarbonate 650 MG tablet [id number]  Take 1,300 mg by mouth 3 (three) times daily. [provider]  Active Self  tamsulosin (FLOMAX) 0.4 MG CAPS capsule [id number] Yes Take 0.4 mg by mouth daily after supper. [provider] Taking Active Self            Home Care and Equipment/Supplies: Were Home Health Services Ordered?: Yes Name of Home Health Agency:: [facility] Has Agency set up a time to come to your home?: Yes First Home Health Visit Date: 02/05/23 (pt reported therapist had arrived to home while on call) Any new equipment or medical supplies ordered?: Yes Name of Medical supply agency?: [employer] Were you able to get the equipment/medical supplies?: Yes Do you have any questions related to the use of the equipment/supplies?: No  Functional Questionnaire: Do you need assistance with bathing/showering or dressing?: Yes Do you need assistance with meal preparation?: Yes Do you need assistance with eating?: No Do you have difficulty maintaining continence: No Do you need assistance with getting out of bed/getting out of a chair/moving?: No Do you have difficulty managing or taking your medications?: No  Follow up  appointments reviewed: PCP Follow-up appointment confirmed?: Yes Date of PCP follow-up appointment?: 02/28/23 Follow-up Provider: Dr. Laural Benes Specialist [facility] Follow-up appointment confirmed?: No Reason Specialist Follow-Up Not Confirmed: Patient has Specialist Provider Number and will Call for Appointment (pt unsure about specialist appts-reviewed follow up info per d/c instructions and encouraged him to refer to paperwork) Do you need transportation to your follow-up appointment?: No (pt aware that he is not to resume driving-stares family will take him to appts) Do you understand care options if your condition(s) worsen?: Yes-patient verbalized understanding   TOC Interventions Today    Flowsheet Row Most Recent Value  TOC Interventions   TOC Interventions Discussed/Reviewed TOC Interventions Discussed, Post discharge activity limitations per provider       Interventions Today    Flowsheet Row Most Recent Value  Chronic Disease   Chronic disease during today's visit Other  [stroke]  General Interventions   General Interventions Discussed/Reviewed General Interventions Discussed, Doctor Visits, Durable Medical Equipment (DME)  Doctor Visits Discussed/Reviewed PCP, Doctor Visits Discussed, Specialist  Durable Medical Equipment (DME) Walker, BP Cuff  PCP/Specialist Visits Compliance with follow-up visit  Education Interventions   Education Provided Provided Education  Provided Verbal Education On Nutrition, When to see the doctor, Medication, Other  [sx/ bowel mgmt]  Nutrition Interventions   Nutrition Discussed/Reviewed Nutrition Discussed, Adding fruits and vegetables, Increasing proteins, Decreasing fats, Decreasing salt, Fluid intake, Decreasing sugar intake  Pharmacy Interventions   Pharmacy Dicussed/Reviewed Pharmacy Topics Discussed, Medications and their functions  Safety Interventions   Safety Discussed/Reviewed Safety Discussed, Home Safety, Fall Risk  Home Safety  Assistive Devices  [pt confirms he has walker]       Antionette Fairy, RN,BSN,CCM [facility] Care Management Care Management Community Coordinator Direct Phone: [phone] Toll Free: [phone] Fax: [phone]

## 2023-02-06 ENCOUNTER — Telehealth: Payer: Self-pay | Admitting: Internal Medicine

## 2023-02-06 NOTE — Telephone Encounter (Signed)
Home Health Verbal Orders - Caller/Agency: Marliss Czar with [facility] Number: [phone]  Requesting OT/PT/Skilled Nursing/Social Work/Speech Therapy: PT and OT  Frequency: 1 x 9  for stroke

## 2023-02-07 NOTE — Telephone Encounter (Signed)
Verbal Orders given to [facility] for OT and PT. A

## 2023-02-07 NOTE — Telephone Encounter (Signed)
Leigh with [facility] is calling back to check on the verbal orders for PT and OT. Please give Marliss Czar a call back [phone].

## 2023-02-13 NOTE — Progress Notes (Signed)
Carelink Summary Report / Loop Recorder 

## 2023-02-28 ENCOUNTER — Encounter: Payer: Self-pay | Admitting: Internal Medicine

## 2023-02-28 ENCOUNTER — Ambulatory Visit: Payer: Medicare Other | Attending: Internal Medicine | Admitting: Internal Medicine

## 2023-02-28 VITALS — BP 109/64 | HR 67 | Temp 97.8°F | Ht 68.0 in | Wt 129.0 lb

## 2023-02-28 DIAGNOSIS — Z7982 Long term (current) use of aspirin: Secondary | ICD-10-CM | POA: Insufficient documentation

## 2023-02-28 DIAGNOSIS — R2689 Other abnormalities of gait and mobility: Secondary | ICD-10-CM | POA: Insufficient documentation

## 2023-02-28 DIAGNOSIS — I6930 Unspecified sequelae of cerebral infarction: Secondary | ICD-10-CM | POA: Diagnosis not present

## 2023-02-28 DIAGNOSIS — Z992 Dependence on renal dialysis: Secondary | ICD-10-CM | POA: Diagnosis not present

## 2023-02-28 DIAGNOSIS — N186 End stage renal disease: Secondary | ICD-10-CM | POA: Diagnosis not present

## 2023-02-28 DIAGNOSIS — E1159 Type 2 diabetes mellitus with other circulatory complications: Secondary | ICD-10-CM

## 2023-02-28 DIAGNOSIS — E1122 Type 2 diabetes mellitus with diabetic chronic kidney disease: Secondary | ICD-10-CM | POA: Insufficient documentation

## 2023-02-28 DIAGNOSIS — I120 Hypertensive chronic kidney disease with stage 5 chronic kidney disease or end stage renal disease: Secondary | ICD-10-CM | POA: Insufficient documentation

## 2023-02-28 DIAGNOSIS — E1140 Type 2 diabetes mellitus with diabetic neuropathy, unspecified: Secondary | ICD-10-CM | POA: Insufficient documentation

## 2023-02-28 DIAGNOSIS — Z09 Encounter for follow-up examination after completed treatment for conditions other than malignant neoplasm: Secondary | ICD-10-CM | POA: Diagnosis present

## 2023-02-28 DIAGNOSIS — I69398 Other sequelae of cerebral infarction: Secondary | ICD-10-CM

## 2023-02-28 DIAGNOSIS — Z2821 Immunization not carried out because of patient refusal: Secondary | ICD-10-CM

## 2023-02-28 DIAGNOSIS — D631 Anemia in chronic kidney disease: Secondary | ICD-10-CM | POA: Diagnosis not present

## 2023-02-28 DIAGNOSIS — Z7902 Long term (current) use of antithrombotics/antiplatelets: Secondary | ICD-10-CM | POA: Insufficient documentation

## 2023-02-28 DIAGNOSIS — E113299 Type 2 diabetes mellitus with mild nonproliferative diabetic retinopathy without macular edema, unspecified eye: Secondary | ICD-10-CM

## 2023-02-28 DIAGNOSIS — E11319 Type 2 diabetes mellitus with unspecified diabetic retinopathy without macular edema: Secondary | ICD-10-CM | POA: Diagnosis not present

## 2023-02-28 DIAGNOSIS — I69354 Hemiplegia and hemiparesis following cerebral infarction affecting left non-dominant side: Secondary | ICD-10-CM | POA: Diagnosis not present

## 2023-02-28 DIAGNOSIS — Z2820 Immunization not carried out because of patient decision for unspecified reason: Secondary | ICD-10-CM | POA: Insufficient documentation

## 2023-02-28 DIAGNOSIS — R339 Retention of urine, unspecified: Secondary | ICD-10-CM

## 2023-02-28 DIAGNOSIS — I152 Hypertension secondary to endocrine disorders: Secondary | ICD-10-CM

## 2023-02-28 DIAGNOSIS — R269 Unspecified abnormalities of gait and mobility: Secondary | ICD-10-CM

## 2023-02-28 DIAGNOSIS — Z2839 Other underimmunization status: Secondary | ICD-10-CM | POA: Diagnosis not present

## 2023-02-28 DIAGNOSIS — Z978 Presence of other specified devices: Secondary | ICD-10-CM

## 2023-02-28 MED ORDER — AMLODIPINE BESYLATE 5 MG PO TABS
5.0000 mg | ORAL_TABLET | Freq: Every day | ORAL | 1 refills | Status: DC
Start: 2023-02-28 — End: 2023-08-26

## 2023-02-28 MED ORDER — ATORVASTATIN CALCIUM 80 MG PO TABS
80.0000 mg | ORAL_TABLET | Freq: Every day | ORAL | 1 refills | Status: DC
Start: 2023-02-28 — End: 2023-08-26

## 2023-02-28 MED ORDER — TAMSULOSIN HCL 0.4 MG PO CAPS
0.4000 mg | ORAL_CAPSULE | Freq: Every day | ORAL | 1 refills | Status: DC
Start: 2023-02-28 — End: 2023-06-01

## 2023-02-28 MED ORDER — CARVEDILOL 6.25 MG PO TABS
6.2500 mg | ORAL_TABLET | Freq: Two times a day (BID) | ORAL | 1 refills | Status: DC
Start: 2023-02-28 — End: 2023-06-01

## 2023-02-28 MED ORDER — EZETIMIBE 10 MG PO TABS
10.0000 mg | ORAL_TABLET | Freq: Every day | ORAL | 1 refills | Status: DC
Start: 2023-02-28 — End: 2023-06-01

## 2023-02-28 NOTE — Patient Instructions (Signed)
Will send diabetic testing supplies to your pharmacy.

## 2023-02-28 NOTE — Progress Notes (Signed)
Patient ID: Douglas Briggs, male    DOB: [birth year]  MRN: [id number]  CC: Follow-up (Follow-up. Med refills /Discuss stroke/No to flu vax. No to shingles vax.)   Subjective: Douglas Briggs is a 62 y.o. male who presents for chronic ds management and hosp f/u.  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  History of Present Illness   Douglas Briggs, a 63 year old with a history of diabetes, hypertension, hyperlipidemia, and end stage renal disease on HD, presents for a follow-up after a recent hospitalization at [facility]  (8/23-02/03/2023) for acute stroke and urinary retention.  He presented with vomiting, dizziness and balance issues. Noted to have CVA involved posterior cerebral artery territory with acute left pontine infarct.  + moderate RT IC artery stenosis and mild stenosis on the left.  Patient placed on DAPT with ASA 81 mg and Plavix 75 mg daily.  Plan is to continue DAPT 90 days which will and 04/17/2023.  After that it is recommended that Plavix be discontinued and ASA increased to 325 mg daily. Patient developed urinary retention thought to be due to BPH.  Foley catheter was not placed.  Started on tamsulosin.  At the time of discharge catheter was removed and plan was for him to follow-up with urology on 02/17/2023.  Since then, he was back in the emergency room with urinary retention and catheter had to be placed.  Today: Patient has all of his medications with him.  He is currently staying with his daughter who lives in [place].   She states he will be with her for about another month before returning to [place].  She manages his medications.  He is taking the aspirin and Plavix.  Daughter is aware that Plavix should be stopped after last dose on 04/17/2023 and add aspirin would be increased to full-strength. post-stroke, the patient reports residual left-sided weakness, particularly in the leg, which has improved with rehabilitation.  Regarding urinary retention, he has seen the urologist at [facility].  Foley catheter remains in place as he failed spontaneous voiding attempt.  There was discussions about placement of suprapubic catheter.  His catheter was changed on that visit and needs to be changed out once a month.  They have follow-up appointment.  The patient's diabetes has been under control without medications, with a recent A1c of 5.4. However, the patient does not currently have a glucometer for home monitoring.  He would like to have 1.  Blood pressure has been good since being on HD.  Most of his antihypertensive were discontinued.  Currently on Norvasc 5 mg daily and carvedilol 6.25 mg twice a day.  He goes to dialysis Tuesdays, Thursdays and Saturdays.    HM: The patient has declined both the flu and shingles vaccines.       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   HLD (hyperlipidemia)    Normocytic anemia    CKD (chronic  kidney disease)    Essential hypertension 11/22/2019   Atherosclerotic vascular disease 11/22/2019   Acute CVA (cerebrovascular accident) (HCC) 10/30/2019   TIA (transient ischemic attack) 10/29/2019   Stroke-like symptoms 10/29/2019   Right sided numbness    Acquired hammer toe of left foot 03/12/2017   Tinea pedis of both feet 03/12/2017   Male hypogonadism 11/14/2016   Restless leg syndrome 11/14/2016   Uncontrolled type 2 diabetes mellitus with diabetic polyneuropathy, without long-term current use of insulin 11/12/2016     Current Outpatient Medications on File Prior to Visit  Medication Sig Dispense Refill   aspirin EC 81 MG tablet Take 81 mg by mouth daily. Swallow whole.     clopidogrel (PLAVIX) 75 MG tablet Take 75 mg by mouth daily. Take 1 tablet (75 mg total) by mouth daily Indications: prevention for a blood clot going to the brain, treatment to prevent a heart attack, treatment to prevent peripheral artery thromboembolism. Take aspirin 81 mg daily and Plavix 75 mg daily for 90 days (end date 04/17/2023), then take aspirin 325 mg daily indefinitely.     famotidine (PEPCID) 20 MG tablet Take 20 mg by mouth 2 (two) times daily as needed for heartburn or indigestion.     ferric citrate (AURYXIA) 1 GM 210 MG(Fe) tablet Take 210 mg by mouth 3 (three) times daily with meals.     No current facility-administered medications on file prior to visit.    No Known Allergies  Social History   Socioeconomic History   Marital status: Single    Spouse name: Not on file   Number of children: 2   Years of education: Not on file   Highest education level: Not on file  Occupational History   Not on file  Tobacco Use   Smoking status: Never   Smokeless tobacco: Never  Vaping Use   Vaping status: Never Used  Substance and Sexual Activity   Alcohol use: No   Drug use: Never   Sexual activity: Not Currently  Other Topics Concern   Not on file  Social History Narrative   Not on  file   Social Determinants of Health   Financial Resource Strain: Low Risk  (08/16/2022)   Overall Financial Resource Strain (CARDIA)    Difficulty of Paying Living Expenses: Not hard at all  Food Insecurity: Low Risk  (01/17/2023)   Received from [facility]   Hunger Vital Sign    Worried About Running Out of Food in the Last Year: Never true    Ran Out of Food in the Last Year: Never true  Transportation Needs: No Transportation Needs (01/17/2023)   Received from [facility]   PRAPARE - Transportation    Lack of Transportation (Medical): No    Lack of Transportation (Non-Medical): No  Recent Concern: Transportation Needs - Unmet Transportation Needs (01/17/2023)   Received from [facility]   Transportation    In the past 12 months, has lack of reliable transportation kept you from medical appointments, meetings, work or from getting things needed for daily  living? : Yes  Physical Activity: Insufficiently Active (08/16/2022)   Exercise Vital Sign    Days of Exercise per Week: 2 days    Minutes of Exercise per Session: 30 min  Stress: No Stress Concern Present (08/16/2022)   [employer] of Occupational Health - Occupational Stress Questionnaire    Feeling of Stress : Not at all  Social Connections: Moderately Isolated (08/16/2022)   Social Connection and Isolation Panel [NHANES]    Frequency of Communication with Friends and Family: More than three times a week    Frequency of Social Gatherings with Friends and Family: More than three times a week    Attends Religious Services: More than 4 times per year    Active Member of [employer] or Organizations: No    Attends Banker Meetings: Never    Marital Status: Divorced  Catering manager Violence: Not At Risk (08/16/2022)   Humiliation, Afraid, Rape, and Kick questionnaire    Fear of Current or Ex-Partner: No    Emotionally Abused: No    Physically Abused: No    Sexually Abused: No    Family History  Problem  Relation Age of Onset   Hypertension Father    Cancer Father    Diabetes Paternal Aunt    Heart disease Neg Hx     Past Surgical History:  Procedure Laterality Date   AMPUTATION Left 12/20/2019   Procedure: AMPUTATION 5TH TOE;  Surgeon: Terance Hart, MD;  Location: [facility] OR;  Service: Orthopedics;  Laterality: Left;   BASCILIC VEIN TRANSPOSITION Right 12/23/2022   Procedure: RIGHT FIRST STAGE BRACHIOBASILIC VEIN TRANSPOSITION;  Surgeon: Cephus Shelling, MD;  Location: [facility] OR;  Service: Vascular;  Laterality: Right;   BUBBLE STUDY  09/22/2020   Procedure: BUBBLE STUDY;  Surgeon: Little Ishikawa, MD;  Location: [facility] ENDOSCOPY;  Service: Cardiovascular;;   CATARACT EXTRACTION Bilateral 02/2021   LOOP RECORDER INSERTION N/A 09/22/2020   Procedure: LOOP RECORDER INSERTION;  Surgeon: Regan Lemming, MD;  Location: [facility] INVASIVE CV LAB;  Service: Cardiovascular;  Laterality: N/A;   TEE WITHOUT CARDIOVERSION N/A 09/22/2020   Procedure: TRANSESOPHAGEAL ECHOCARDIOGRAM (TEE);  Surgeon: Little Ishikawa, MD;  Location: [facility] ENDOSCOPY;  Service: Cardiovascular;  Laterality: N/A;  LOOP    ROS: Review of Systems Negative except as stated above  PHYSICAL EXAM: BP 109/64 (BP Location: Left Arm, Patient Position: Sitting, Cuff Size: Normal)   Pulse 67   Temp 97.8 F (36.6 C) (Oral)   Ht 5\' 8"  (1.727 m)   Wt 129 lb (58.5 kg)   SpO2 100%   BMI 19.61 kg/m   Wt Readings from Last 3 Encounters:  02/28/23 129 lb (58.5 kg)  12/23/22 130 lb 1.1 oz (59 kg)  12/10/22 130 lb (59 kg)    Physical Exam   General appearance - alert, well appearing, and in no distress Mental status -patient answers most questions appropriately. Chest - clear to auscultation, no wheezes, rales or rhonchi, symmetric air entry Heart - normal rate, regular rhythm, normal S1, S2, no murmurs, rubs, clicks or gallops Neurological -patient ambulates with a standard walker that has wheels in front.   Power in the lower extremities 5/5.  Power in the upper extremities 4+/5. Extremities -no lower extremity edema     Latest Ref Rng & Units 12/23/2022    8:15 AM 05/03/2022    3:42 PM 12/13/2021    2:27 PM  CMP  Glucose 70 - 99 mg/dL 130  86  865  BUN 8 - 23 mg/dL 46  29  35   Creatinine 0.61 - 1.24 mg/dL 1.61  0.96  0.45   Sodium 135 - 145 mmol/L 133  138  139   Potassium 3.5 - 5.1 mmol/L 3.9  5.0  5.8   Chloride 98 - 111 mmol/L 99  109  112   CO2 20 - 29 mmol/L  19  19   Calcium 8.6 - 10.2 mg/dL  8.0  8.5   Total Protein 6.0 - 8.5 g/dL  5.3    Total Bilirubin 0.0 - 1.2 mg/dL  <4.0    Alkaline Phos 44 - 121 IU/L  129    AST 0 - 40 IU/L  10    ALT 0 - 44 IU/L  13     Lipid Panel     Component Value Date/Time   CHOL 395 (H) 05/03/2022 1542   TRIG 168 (H) 05/03/2022 1542   HDL 78 05/03/2022 1542   CHOLHDL 5.1 (H) 05/03/2022 1542   CHOLHDL 4.4 12/21/2019 0123   VLDL 23 12/21/2019 0123   LDLCALC 284 (H) 05/03/2022 1542    CBC    Component Value Date/Time   WBC 5.4 07/03/2022 0826   RBC 3.86 (L) 07/03/2022 0826   HGB 10.5 (L) 12/23/2022 0815   HGB 11.1 (L) 09/06/2020 1327   HCT 31.0 (L) 12/23/2022 0815   HCT 34.6 (L) 09/06/2020 1327   PLT 158 07/03/2022 0826   PLT 164 09/06/2020 1327   MCV 85.2 07/03/2022 0826   MCV 85 09/06/2020 1327   MCH 28.2 07/03/2022 0826   MCHC 33.1 07/03/2022 0826   RDW 13.9 07/03/2022 0826   RDW 14.2 09/06/2020 1327   LYMPHSABS 1.3 12/22/2019 0424   MONOABS 0.5 12/22/2019 0424   EOSABS 0.2 12/22/2019 0424   BASOSABS 0.0 12/22/2019 0424    ASSESSMENT AND PLAN: 1. Hospital discharge follow-up 2. History of cerebrovascular accident (CVA) with residual deficit -Continue aspirin 81mg  and clopidogrel daily until November 21st. -After November 21st, stop clopidogrel and increase aspirin to 325mg  daily to prevent another stroke. Daughter expressed understanding. Continue Zetia and Lipitor. Blood pressure under good control.  Diabetes  controlled based on recent A1c. - ezetimibe (ZETIA) 10 MG tablet; Take 1 tablet (10 mg total) by mouth daily.  Dispense: 90 tablet; Refill: 1 - atorvastatin (LIPITOR) 80 MG tablet; Take 1 tablet (80 mg total) by mouth daily in the afternoon.  Dispense: 90 tablet; Refill: 1  3. Gait disturbance, post-stroke Currently ambulating with walker.  Currently staying with his daughter who is assisting with his care.  4. Hypertension associated with type 2 diabetes mellitus (HCC) At goal.  Continue carvedilol and amlodipine - carvedilol (COREG) 6.25 MG tablet; Take 1 tablet (6.25 mg total) by mouth 2 (two) times daily with a meal.  Dispense: 180 tablet; Refill: 1 - amLODipine (NORVASC) 5 MG tablet; Take 1 tablet (5 mg total) by mouth daily.  Dispense: 90 tablet; Refill: 1  5. Type 2 diabetes mellitus with background retinopathy (HCC) Diet control.  Will send testing supplies for him to check blood sugars daily or several times a week. - ezetimibe (ZETIA) 10 MG tablet; Take 1 tablet (10 mg total) by mouth daily.  Dispense: 90 tablet; Refill: 1 - Ambulatory referral to Ophthalmology - atorvastatin (LIPITOR) 80 MG tablet; Take 1 tablet (80 mg total) by mouth daily in the afternoon.  Dispense: 90 tablet; Refill: 1  6. ESRD on dialysis [facility]) He is compliant with going to dialysis  sessions.  7. Urinary retention Has Foley catheter in place and followed by urology.  Discussion about possible suprapubic catheter placement in the future if he continues to fail voiding trial - tamsulosin (FLOMAX) 0.4 MG CAPS capsule; Take 1 capsule (0.4 mg total) by mouth daily after supper.  Dispense: 90 capsule; Refill: 1  8. Foley catheter in place   9. Influenza vaccination declined    Patient was given the opportunity to ask questions.  Patient verbalized understanding of the plan and was able to repeat key elements of the plan.   This documentation was completed using Paediatric nurse.  Any  transcriptional errors are unintentional.  Orders Placed This Encounter  Procedures   Ambulatory referral to Ophthalmology     Requested Prescriptions   Signed Prescriptions Disp Refills   carvedilol (COREG) 6.25 MG tablet 180 tablet 1    Sig: Take 1 tablet (6.25 mg total) by mouth 2 (two) times daily with a meal.   ezetimibe (ZETIA) 10 MG tablet 90 tablet 1    Sig: Take 1 tablet (10 mg total) by mouth daily.   tamsulosin (FLOMAX) 0.4 MG CAPS capsule 90 capsule 1    Sig: Take 1 capsule (0.4 mg total) by mouth daily after supper.   amLODipine (NORVASC) 5 MG tablet 90 tablet 1    Sig: Take 1 tablet (5 mg total) by mouth daily.   atorvastatin (LIPITOR) 80 MG tablet 90 tablet 1    Sig: Take 1 tablet (80 mg total) by mouth daily in the afternoon.    Return in about 2 months (around 04/30/2023).  Jonah Blue, MD, FACP

## 2023-03-02 MED ORDER — ACCU-CHEK GUIDE W/DEVICE KIT
PACK | 0 refills | Status: AC
Start: 2023-03-02 — End: ?

## 2023-03-02 MED ORDER — ACCU-CHEK SOFTCLIX LANCETS MISC
12 refills | Status: AC
Start: 2023-03-02 — End: ?

## 2023-03-02 MED ORDER — ACCU-CHEK GUIDE VI STRP
ORAL_STRIP | 12 refills | Status: AC
Start: 2023-03-02 — End: ?

## 2023-03-10 ENCOUNTER — Encounter (HOSPITAL_COMMUNITY): Payer: Medicare Other

## 2023-04-14 ENCOUNTER — Encounter (HOSPITAL_COMMUNITY): Payer: Medicare Other

## 2023-05-05 ENCOUNTER — Other Ambulatory Visit: Payer: Self-pay

## 2023-05-05 DIAGNOSIS — N186 End stage renal disease: Secondary | ICD-10-CM

## 2023-05-16 ENCOUNTER — Ambulatory Visit (HOSPITAL_COMMUNITY): Payer: Medicare Other

## 2023-05-16 ENCOUNTER — Ambulatory Visit: Payer: Medicare Other

## 2023-05-19 ENCOUNTER — Encounter (HOSPITAL_COMMUNITY): Payer: Medicare Other

## 2023-05-31 ENCOUNTER — Other Ambulatory Visit: Payer: Self-pay | Admitting: Internal Medicine

## 2023-05-31 DIAGNOSIS — R339 Retention of urine, unspecified: Secondary | ICD-10-CM

## 2023-05-31 DIAGNOSIS — I6930 Unspecified sequelae of cerebral infarction: Secondary | ICD-10-CM

## 2023-05-31 DIAGNOSIS — E1159 Type 2 diabetes mellitus with other circulatory complications: Secondary | ICD-10-CM

## 2023-05-31 DIAGNOSIS — E113299 Type 2 diabetes mellitus with mild nonproliferative diabetic retinopathy without macular edema, unspecified eye: Secondary | ICD-10-CM

## 2023-06-23 ENCOUNTER — Encounter (HOSPITAL_COMMUNITY): Payer: Medicare HMO

## 2023-06-24 ENCOUNTER — Ambulatory Visit (INDEPENDENT_AMBULATORY_CARE_PROVIDER_SITE_OTHER): Payer: Medicare HMO | Admitting: Physician Assistant

## 2023-06-24 ENCOUNTER — Ambulatory Visit (HOSPITAL_COMMUNITY)
Admission: RE | Admit: 2023-06-24 | Discharge: 2023-06-24 | Disposition: A | Discharge status: Home / Self Care | Payer: Medicare HMO | Source: Ambulatory Visit | Attending: Vascular Surgery | Admitting: Vascular Surgery

## 2023-06-24 VITALS — BP 102/66 | HR 81 | Temp 98.0°F | Resp 18 | Ht 68.0 in | Wt 134.3 lb

## 2023-06-24 DIAGNOSIS — I739 Peripheral vascular disease, unspecified: Secondary | ICD-10-CM | POA: Diagnosis not present

## 2023-06-24 DIAGNOSIS — Z992 Dependence on renal dialysis: Secondary | ICD-10-CM | POA: Diagnosis present

## 2023-06-24 DIAGNOSIS — N186 End stage renal disease: Secondary | ICD-10-CM | POA: Diagnosis not present

## 2023-06-24 NOTE — Progress Notes (Signed)
POST OPERATIVE OFFICE NOTE    CC:  F/u for surgery  HPI:  This is a 63 y.o. male who is s/p right 1st stage BVT on 12/23/2022 by Dr. Chestine Spore. He has TDC that was placed at [facility] 11/04/2022.  Pt is here with his daughter.  He states he does not have pain/numbness in the right hand.    He has hx of PAD and his last ABI was in 2022 with decreased TBI.  He states he has a wound on the right foot that he got from wearing some shoes that were too tight.  He states it has been present for a couple of months and is not really getting any better or worse.  He states he does have some pain in the right foot at rest.  He does not really endorse any pain in the left foot.  He does get some cramping in the left leg but not with walking.    He also had a carotid duplex in 2022 that revealed 1-39% bilateral ICA stenosis.   He does have an indwelling foley catheter that is exchanged about every 3 days.   The pt is on dialysis M/W/F at [facility] [address] location.   No Known Allergies  Current Outpatient Medications  Medication Sig Dispense Refill   Accu-Chek Softclix Lancets lancets Use as instructed to check BS once a day 100 each 12   amLODipine (NORVASC) 5 MG tablet Take 1 tablet (5 mg total) by mouth daily. 90 tablet 1   aspirin EC 81 MG tablet Take 81 mg by mouth daily. Swallow whole.     atorvastatin (LIPITOR) 80 MG tablet Take 1 tablet (80 mg total) by mouth daily in the afternoon. 90 tablet 1   Blood Glucose Monitoring Suppl (ACCU-CHEK GUIDE) w/Device KIT Check BS once a day 1 kit 0   carvedilol (COREG) 6.25 MG tablet TAKE 1 TABLET BY MOUTH 2 TIMES DAILY WITH A MEAL. 180 tablet 0   ezetimibe (ZETIA) 10 MG tablet TAKE 1 TABLET BY MOUTH EVERY DAY 90 tablet 0   famotidine (PEPCID) 20 MG tablet Take 20 mg by mouth 2 (two) times daily as needed for heartburn or indigestion.     ferric citrate (AURYXIA) 1 GM 210 MG(Fe) tablet Take 210 mg by mouth 3 (three) times daily with meals.     glucose blood  (ACCU-CHEK GUIDE) test strip Use as instructed to check blood sugars once a day. 100 each 12   tamsulosin (FLOMAX) 0.4 MG CAPS capsule TAKE 1 CAPSULE BY MOUTH EVERY DAY AFTER SUPPER 90 capsule 0   No current facility-administered medications for this visit.     ROS:  See HPI  Physical Exam:  Today's Vitals   06/24/23 1307  BP: 102/66  Pulse: 81  Resp: 18  Temp: 98 F (36.7 C)  TempSrc: Temporal  SpO2: 98%  Weight: 134 lb 4.8 oz (60.9 kg)  Height: 5\' 8"  (1.727 m)  PainSc: 0-No pain   Body mass index is 20.42 kg/m.   Incision:  healed nicely Extremities:   There is a palpable right radial pulse.   Motor and sensory are in tact.   There is a thrill present.  Access is  easily palpable Monophasic doppler flow right pero/PT; faint left peroneal Bilateral femoral pulses are palpable  Right foot ulcer:   Abdomen: soft; aortic pulse is palpable Cardiac:  regular; no carotid bruits appreciated.    Dialysis Duplex on 06/24/2023: Findings:  +--------------------+----------+-----------------+--------+  AVF  PSV (cm/s)Flow Vol (mL/min)Comments  +--------------------+----------+-----------------+--------+  Native artery inflow   213           683                 +--------------------+----------+-----------------+--------+  AVF Anastomosis        393                               +--------------------+----------+-----------------+--------+     +------------+----------+-------------+----------+-------------------------  OUTFLOW VEIN  Diameter (cm)Depth (cm)      Describe        +------------+----------+-------------+----------+-------------------------  Prox UA        100        0.87        0.46          Retained valve     +------------+----------+-------------+----------+-------------------------  Mid UA         133        0.80        0.49    multiple competing Branches largest measuring 0.29cm      +------------+----------+-------------+----------+-------------------------  Dist UA        100        0.98        0.31                             +------------+----------+-------------+----------+-------------------------  AC Fossa       618        0.46        0.34             stenotic        -------------------------------------------------------------------------------    Assessment/Plan:  This is a 63 y.o. male who is s/p:  right 1st stage BVT on 12/23/2022 by Dr. Chestine Spore.   ESRD -the pt does not have evidence of steal. -pt will need 2nd stage BVT in the future.. -if pt has tunneled catheter, this can be removed at the discretion of the dialysis center once the pt's access has been successfully cannulated to their satisfaction.  -discussed with pt that access does not last forever and will need intervention or even new access at some point.   PAD -pt with ulceration on the right lateral foot that has been present for a couple of months.  He has been seen by our office for PAD but has been lost to follow up.   -he has monophasic doppler flow in the right foot and had decreased TBI a couple of years ago.   -discussed with Dr. Chestine Spore and will bring him back in the next couple of weeks for RLE arterial duplex and ABI and see Dr. Chestine Spore. -encouraged him to wash with soap and water daily and put dry dressing on.    Doreatha Massed, [facility] [phone]  Clinic MD:  Chestine Spore

## 2023-06-27 DIAGNOSIS — I120 Hypertensive chronic kidney disease with stage 5 chronic kidney disease or end stage renal disease: Secondary | ICD-10-CM | POA: Diagnosis not present

## 2023-06-27 DIAGNOSIS — N186 End stage renal disease: Secondary | ICD-10-CM | POA: Diagnosis not present

## 2023-06-27 DIAGNOSIS — Z992 Dependence on renal dialysis: Secondary | ICD-10-CM | POA: Diagnosis not present

## 2023-07-04 ENCOUNTER — Other Ambulatory Visit: Payer: Self-pay

## 2023-07-04 DIAGNOSIS — I739 Peripheral vascular disease, unspecified: Secondary | ICD-10-CM

## 2023-07-17 ENCOUNTER — Ambulatory Visit (HOSPITAL_COMMUNITY): Payer: Medicare HMO

## 2023-07-24 DIAGNOSIS — R339 Retention of urine, unspecified: Secondary | ICD-10-CM | POA: Diagnosis not present

## 2023-07-24 DIAGNOSIS — Z466 Encounter for fitting and adjustment of urinary device: Secondary | ICD-10-CM | POA: Diagnosis not present

## 2023-07-25 DIAGNOSIS — N186 End stage renal disease: Secondary | ICD-10-CM | POA: Diagnosis not present

## 2023-07-25 DIAGNOSIS — Z992 Dependence on renal dialysis: Secondary | ICD-10-CM | POA: Diagnosis not present

## 2023-07-25 DIAGNOSIS — I120 Hypertensive chronic kidney disease with stage 5 chronic kidney disease or end stage renal disease: Secondary | ICD-10-CM | POA: Diagnosis not present

## 2023-07-28 ENCOUNTER — Encounter (HOSPITAL_COMMUNITY): Payer: Medicaid Other

## 2023-07-31 DIAGNOSIS — Z8673 Personal history of transient ischemic attack (TIA), and cerebral infarction without residual deficits: Secondary | ICD-10-CM | POA: Diagnosis not present

## 2023-07-31 DIAGNOSIS — Z7982 Long term (current) use of aspirin: Secondary | ICD-10-CM | POA: Diagnosis not present

## 2023-08-05 ENCOUNTER — Emergency Department (HOSPITAL_BASED_OUTPATIENT_CLINIC_OR_DEPARTMENT_OTHER)
Admission: EM | Admit: 2023-08-05 | Discharge: 2023-08-05 | Discharge status: Absent without leave | Source: Home / Self Care

## 2023-08-19 ENCOUNTER — Encounter: Payer: Self-pay | Admitting: Internal Medicine

## 2023-08-19 ENCOUNTER — Ambulatory Visit: Payer: Medicare HMO | Admitting: Vascular Surgery

## 2023-08-22 ENCOUNTER — Telehealth (HOSPITAL_COMMUNITY): Payer: Self-pay | Admitting: *Deleted

## 2023-08-22 ENCOUNTER — Emergency Department (HOSPITAL_COMMUNITY)
Admission: EM | Admit: 2023-08-22 | Discharge: 2023-08-22 | Disposition: A | Discharge status: Home / Self Care | Attending: Emergency Medicine | Admitting: Emergency Medicine

## 2023-08-22 ENCOUNTER — Other Ambulatory Visit: Payer: Self-pay

## 2023-08-22 ENCOUNTER — Encounter (HOSPITAL_COMMUNITY): Payer: Self-pay | Admitting: Emergency Medicine

## 2023-08-22 DIAGNOSIS — Z7982 Long term (current) use of aspirin: Secondary | ICD-10-CM | POA: Diagnosis not present

## 2023-08-22 DIAGNOSIS — R339 Retention of urine, unspecified: Secondary | ICD-10-CM | POA: Diagnosis not present

## 2023-08-22 LAB — URINALYSIS, ROUTINE W REFLEX MICROSCOPIC
Bilirubin Urine: NEGATIVE
Glucose, UA: NEGATIVE mg/dL
Ketones, ur: NEGATIVE mg/dL
Nitrite: NEGATIVE
Protein, ur: 100 mg/dL — AB
Specific Gravity, Urine: 1.011 (ref 1.005–1.030)
WBC, UA: >50 WBC/hpf (ref 0–5)
pH: 7 (ref 5.0–8.0)

## 2023-08-22 LAB — CBC WITH DIFFERENTIAL/PLATELET
Abs Immature Granulocytes: 0.01 10*3/uL (ref 0.00–0.07)
Basophils Absolute: 0.1 10*3/uL (ref 0.0–0.1)
Basophils Relative: 1 %
Eosinophils Absolute: 0.2 10*3/uL (ref 0.0–0.5)
Eosinophils Relative: 4 %
HCT: 37.4 % — ABNORMAL LOW (ref 39.0–52.0)
Hemoglobin: 12 g/dL — ABNORMAL LOW (ref 13.0–17.0)
Immature Granulocytes: 0 %
Lymphocytes Relative: 36 %
Lymphs Abs: 1.7 10*3/uL (ref 0.7–4.0)
MCH: 28.4 pg (ref 26.0–34.0)
MCHC: 32.1 g/dL (ref 30.0–36.0)
MCV: 88.4 fL (ref 80.0–100.0)
Monocytes Absolute: 0.5 10*3/uL (ref 0.1–1.0)
Monocytes Relative: 11 %
Neutro Abs: 2.2 10*3/uL (ref 1.7–7.7)
Neutrophils Relative %: 48 %
Platelets: 103 10*3/uL — ABNORMAL LOW (ref 150–400)
RBC: 4.23 MIL/uL (ref 4.22–5.81)
RDW: 15.8 % — ABNORMAL HIGH (ref 11.5–15.5)
WBC: 4.7 10*3/uL (ref 4.0–10.5)
nRBC: 0 % (ref 0.0–0.2)

## 2023-08-22 LAB — BASIC METABOLIC PANEL WITH GFR
Anion gap: 12 (ref 5–15)
BUN: 10 mg/dL (ref 8–23)
CO2: 31 mmol/L (ref 22–32)
Calcium: 8.6 mg/dL — ABNORMAL LOW (ref 8.9–10.3)
Chloride: 93 mmol/L — ABNORMAL LOW (ref 98–111)
Creatinine, Ser: 3.13 mg/dL — ABNORMAL HIGH (ref 0.61–1.24)
GFR, Estimated: 22 mL/min — ABNORMAL LOW (ref >60)
Glucose, Bld: 82 mg/dL (ref 70–99)
Potassium: 2.9 mmol/L — ABNORMAL LOW (ref 3.5–5.1)
Sodium: 136 mmol/L (ref 135–145)

## 2023-08-22 NOTE — ED Provider Notes (Signed)
 [place] EMERGENCY DEPARTMENT AT [facility] Provider Note   CSN: [id number] Arrival date & time: 08/22/23  1738     History  Chief Complaint  Patient presents with   Urinary Retention    Douglas Briggs is a 63 y.o. male.  63 year old male with prior medical history as detailed below presents for evaluation.  Patient reports that he had a catheter in his bladder for the last several weeks.  This was removed yesterday.  Since removal of the catheter he has noted no urination.  Patient is currently on hemodialysis.  His last dialysis session was this morning.  He reports that he typically would have produced approximately 3 to 4 ounces of urine daily with the catheter in place.  He denies suprapubic pain.  He denies fever.  He is otherwise feeling fine.  The history is provided by the patient.       Home Medications Prior to Admission medications   Medication Sig Start Date End Date Taking? Authorizing Provider  Accu-Chek Softclix Lancets lancets Use as instructed to check BS once a day 03/02/23   Marcine Matar, MD  amLODipine (NORVASC) 5 MG tablet Take 1 tablet (5 mg total) by mouth daily. 02/28/23   Marcine Matar, MD  aspirin EC 81 MG tablet Take 325 mg by mouth daily. Swallow whole.    [provider]  atorvastatin (LIPITOR) 80 MG tablet Take 1 tablet (80 mg total) by mouth daily in the afternoon. 02/28/23   Marcine Matar, MD  Blood Glucose Monitoring Suppl (ACCU-CHEK GUIDE) w/Device KIT Check BS once a day 03/02/23   Marcine Matar, MD  carvedilol (COREG) 6.25 MG tablet TAKE 1 TABLET BY MOUTH 2 TIMES DAILY WITH A MEAL. Patient not taking: Reported on 06/24/2023 06/01/23   Marcine Matar, MD  chlorthalidone (HYGROTON) 25 MG tablet Take 25 mg by mouth daily.    [provider]  ezetimibe (ZETIA) 10 MG tablet TAKE 1 TABLET BY MOUTH EVERY DAY 06/01/23   Marcine Matar, MD  famotidine (PEPCID) 20 MG tablet Take 20 mg by mouth 2 (two)  times daily as needed for heartburn or indigestion.    [provider]  ferric citrate (AURYXIA) 1 GM 210 MG(Fe) tablet Take 210 mg by mouth 3 (three) times daily with meals. Patient not taking: Reported on 06/24/2023    [provider]  glucose blood (ACCU-CHEK GUIDE) test strip Use as instructed to check blood sugars once a day. 03/02/23   Marcine Matar, MD  tamsulosin (FLOMAX) 0.4 MG CAPS capsule TAKE 1 CAPSULE BY MOUTH EVERY DAY AFTER SUPPER 06/01/23   Marcine Matar, MD      Allergies    Patient has no known allergies.    Review of Systems   Review of Systems  All other systems reviewed and are negative.   Physical Exam Updated Vital Signs BP 115/68 (BP Location: Left Arm)   Pulse 87   Temp 98.2 F (36.8 C) (Oral)   Resp 18   SpO2 100%  Physical Exam Vitals and nursing note reviewed.  Constitutional:      General: He is not in acute distress.    Appearance: Normal appearance. He is well-developed.  HENT:     Head: Normocephalic and atraumatic.  Eyes:     Conjunctiva/sclera: Conjunctivae normal.     Pupils: Pupils are equal, round, and reactive to light.  Cardiovascular:     Rate and Rhythm: Normal rate and regular  rhythm.     Heart sounds: Normal heart sounds.  Pulmonary:     Effort: Pulmonary effort is normal. No respiratory distress.     Breath sounds: Normal breath sounds.  Abdominal:     General: There is no distension.     Palpations: Abdomen is soft.     Tenderness: There is no abdominal tenderness.     Comments: No suprapubic distention.  No suprapubic tenderness.  Musculoskeletal:        General: No deformity. Normal range of motion.     Cervical back: Normal range of motion and neck supple.  Skin:    General: Skin is warm and dry.  Neurological:     General: No focal deficit present.     Mental Status: He is alert and oriented to person, place, and time.     ED Results / Procedures / Treatments   Labs (all labs ordered are  listed, but only abnormal results are displayed) Labs Reviewed  URINALYSIS, ROUTINE W REFLEX MICROSCOPIC  BASIC METABOLIC PANEL WITH GFR  CBC WITH DIFFERENTIAL/PLATELET    EKG None  Radiology No results found.  Procedures Procedures    Medications Ordered in ED Medications - No data to display  ED Course/ Medical Decision Making/ A&P                                 Medical Decision Making Amount and/or Complexity of Data Reviewed Labs: ordered.    Medical Screen Complete  This patient presented to the ED with complaint of urinary retention.  This complaint involves an extensive number of treatment options. The initial differential diagnosis includes, but is not limited to, urinary retention  This presentation is: Acute, Chronic, Self-Limited, Previously Undiagnosed, Uncertain Prognosis, Complicated, Systemic Symptoms, and Threat to Life/Bodily Function  Patient currently on hemodialysis with history of ESRD.  Last HD today. Patient also with history of neurogenic bladder.  He apparently had a Foley catheter removed yesterday.  He has been unable to successfully urinate since.  Per his family, patient was to attempt self cathing.  He has been unsuccessful with this.  Family is requesting replacement of Foley catheter.  Patient has outpatient urology services in [place] that he is currently following with.  Co morbidities that complicated the patient's evaluation  See HPI   Additional history obtained:  External records from outside sources obtained and reviewed including prior ED visits and prior Inpatient records.    Problem List / ED Course:  Urinary Retention   Disposition:  After consideration of the diagnostic results and the patients response to treatment, I feel that the patent would benefit from close outpatient follow up.          Final Clinical Impression(s) / ED Diagnoses Final diagnoses:  Urinary retention    Rx / DC Orders ED  Discharge Orders     None         Wynetta Fines, MD 08/22/23 2032

## 2023-08-22 NOTE — ED Triage Notes (Signed)
 Patient presents due to urinary retention. Patient had his catheter removed about 0930 yesterday morning has not been able to urinate since.

## 2023-08-22 NOTE — Telephone Encounter (Signed)
 Received fax from Dr Anthony Sar requesting 2nd stage surgery for AVF. Right 1st stage done 12/23/2022  I will notify Marchelle Folks and scan fax into media.

## 2023-08-22 NOTE — Discharge Instructions (Signed)
 Return for any problem.  ?

## 2023-08-25 ENCOUNTER — Other Ambulatory Visit: Payer: Self-pay | Admitting: Internal Medicine

## 2023-08-25 DIAGNOSIS — Z992 Dependence on renal dialysis: Secondary | ICD-10-CM | POA: Diagnosis not present

## 2023-08-25 DIAGNOSIS — E113299 Type 2 diabetes mellitus with mild nonproliferative diabetic retinopathy without macular edema, unspecified eye: Secondary | ICD-10-CM

## 2023-08-25 DIAGNOSIS — I6930 Unspecified sequelae of cerebral infarction: Secondary | ICD-10-CM

## 2023-08-25 DIAGNOSIS — N186 End stage renal disease: Secondary | ICD-10-CM | POA: Diagnosis not present

## 2023-08-25 DIAGNOSIS — I152 Hypertension secondary to endocrine disorders: Secondary | ICD-10-CM

## 2023-08-25 DIAGNOSIS — I120 Hypertensive chronic kidney disease with stage 5 chronic kidney disease or end stage renal disease: Secondary | ICD-10-CM | POA: Diagnosis not present

## 2023-08-26 ENCOUNTER — Telehealth: Payer: Self-pay

## 2023-08-26 ENCOUNTER — Other Ambulatory Visit: Payer: Self-pay

## 2023-08-26 DIAGNOSIS — Z992 Dependence on renal dialysis: Secondary | ICD-10-CM

## 2023-08-26 NOTE — Telephone Encounter (Signed)
 Attempted to call for surgery scheduling. LVM

## 2023-09-01 ENCOUNTER — Encounter (HOSPITAL_COMMUNITY): Payer: Self-pay

## 2023-09-08 ENCOUNTER — Other Ambulatory Visit: Payer: Self-pay

## 2023-09-08 ENCOUNTER — Encounter (HOSPITAL_COMMUNITY): Payer: Self-pay | Admitting: Vascular Surgery

## 2023-09-08 NOTE — Progress Notes (Signed)
 PCP - Dr Concetta Dee Cardiologist - none  Chest x-ray - n/a EKG - 12/23/22 Stress Test - n/a ECHO - 09/22/20 Cardiac Cath - n/a  Loop Recorder in place as of 09/22/20.  Sleep Study -  n/a  Diabetes Type 2, no meds, does not check blood sugar  Blood Thinner Instructions:  n/a  NPO  Anesthesia review: Yes  STOP now taking any Aspirin (unless otherwise instructed by your surgeon), Aleve, Naproxen, Ibuprofen, Motrin, Advil, Goody's, BC's, all herbal medications, fish oil, and all vitamins.   Coronavirus Screening Do you have any of the following symptoms:  Cough yes/no: No Fever (>100.68F)  yes/no: No Runny nose yes/no: No Sore throat yes/no: No Difficulty breathing/shortness of breath  yes/no: No  Have you traveled in the last 14 days and where? yes/no: No  Patient verbalized understanding of instructions that were given

## 2023-09-09 NOTE — Anesthesia Preprocedure Evaluation (Signed)
 Anesthesia Evaluation  Patient identified by MRN, date of birth, ID band Patient awake    Reviewed: Allergy & Precautions, NPO status , Patient's Chart, lab work & pertinent test results, reviewed documented beta blocker date and time   History of Anesthesia Complications Negative for: history of anesthetic complications  Airway Mallampati: I  TM Distance: >3 FB Neck ROM: Full    Dental  (+) Dental Advisory Given   Pulmonary neg pulmonary ROS   breath sounds clear to auscultation       Cardiovascular hypertension, Pt. on medications and Pt. on home beta blockers (-) angina + Peripheral Vascular Disease   Rhythm:Regular Rate:Normal  '22 ECHO: EF 55 to 60%.  1. The LV has normal function.   2. RVF is normal. The right ventricular size is mildly enlarged.   3. No left atrial/left atrial appendage thrombus was detected.   4. The mitral valve is normal in structure. No evidence of mitral valve regurgitation.   5. The aortic valve is tricuspid. Aortic valve regurgitation is not visualized. No aortic stenosis is present.     Neuro/Psych TIA   GI/Hepatic Neg liver ROS,GERD  Medicated and Controlled,,  Endo/Other  diabetes (glu 99)    Renal/GU ESRF and DialysisRenal disease (MWF, K+ 3.2)     Musculoskeletal   Abdominal   Peds  Hematology Hb 11.9   Anesthesia Other Findings   Reproductive/Obstetrics                             Anesthesia Physical Anesthesia Plan  ASA: 3  Anesthesia Plan: Regional   Post-op Pain Management: Tylenol PO (pre-op)* and Regional block*   Induction: Intravenous  PONV Risk Score and Plan: 1 and Ondansetron and Treatment may vary due to age or medical condition  Airway Management Planned: Natural Airway and Simple Face Mask  Additional Equipment: None  Intra-op Plan:   Post-operative Plan:   Informed Consent: I have reviewed the patients History and  Physical, chart, labs and discussed the procedure including the risks, benefits and alternatives for the proposed anesthesia with the patient or authorized representative who has indicated his/her understanding and acceptance.     Dental advisory given  Plan Discussed with: CRNA and Surgeon  Anesthesia Plan Comments: (Plan routine monitors, Supraclavicular block   PAT note written 09/09/2023 by Collie Kittel, PA-C.  )       Anesthesia Quick Evaluation

## 2023-09-09 NOTE — Progress Notes (Signed)
 Anesthesia Chart Review: Maury Dus  Case: [id number] Date/Time: 09/10/23 1123   Procedure: TRANSPOSITION, VEIN, BASILIC (Right)   Anesthesia type: Choice   Diagnosis: End stage renal disease (HCC) [N18.6]   Pre-op diagnosis: ESRD   [facility] / [facility]   Surgeons: Cephus Shelling, MD       DISCUSSION: Patient is a 63 year old male scheduled for the above procedure. S/p right 1st stage BVT on 12/23/2022 by Dr. Chestine Spore. He has TDC that was placed at [facility] 11/04/2022. As of 06/24/23, HD MWF at [facility] location.   History includes never smoker, HTN, HLD, DM2 (with neuropathy, retinopathy, nephropathy), CVA (10/2019 & 12/2022; [employer] Reveal Linq implantable loop recorder 09/22/20), CKD, anemia, urinary retention (with indwelling catheter).   As of 01/31/23, ILR showed " battery status OK.  Normal device function.  No new symptom, tachy, brady, or episodes.  No new AF episodes."  He is a same-day workup, so anesthesia team to evaluate on the day of surgery.  VS: Ht 5\' 8"  (1.727 m)   Wt 59 kg   BMI 19.77 kg/m  BP Readings from Last 3 Encounters:  08/22/23 (!) 168/63  06/24/23 102/66  02/28/23 109/64   Pulse Readings from Last 3 Encounters:  08/22/23 83  06/24/23 81  02/28/23 67     PROVIDERS: Marcine Matar, MD is PCP Belva Crome, MD is cardiologist. Last evaluation 01/16/21 for atherosclerotic vascular disease. Camnitz, Will, MD is EP (for loop recorder placed 09/22/20) Nilda Simmer, PA is urology provider ([facility]) for urinary retention. Per 07/24/23 office note, "Urinary retention with h/o failed TOV. Has h/o stroke which is likely cause. Has undergone VUDS which revealed underactive bladder without any signs of true bladder outlet obstruction." Although on HD, he was still producing about 800 mL urine daily. He wanted to attempt another TOV when he has next follow-up with foley exchange.  Dalbert Batman, DNP, APRN is neurology provider ([facility]). Six month  follow-up recommended at 07/31/23 visit.  Bufford Buttner, MD is nephrologist   LABS: For day of surgery. A1c 5.3% 08/13/22. H/H 12.0/37.4 on 08/22/23.    EKG: 12/23/22: Sinus rhythm Minimal voltage criteria for LVH, may be normal variant ( Sokolow-Lyon ) Borderline ECG When compared with ECG of 18-Feb-2020 13:49, LVH by voltage NOW PRESENT Confirmed by Riley Lam [phone]) on 12/23/2022 10:23:36 AM   CV: TTE 01/22/23 ([facility]): Normal LV size, wall thickness, wall motion and systolic function with ejection fraction 55-60%  The right ventricle is normal in size and function.  All visualized valves appear thin with normal excursion and function.  The IVC is normal in size with an inspiratory collapse of greater then 50%, suggesting normal right  atrial pressure.  There is no pericardial effusion.  Injection of agitated saline showed no right-to-left shunt.  No prior study for comparison.   US Carotid 01/16/23 ([facility]): Interpretation Summary  Duplex imaging exam of the right internal carotid artery reveals atherosclerosis and Doppler  abnormalities consistent with moderate stenosis, 40-59%. Duplex imaging exam of the left internal  carotid artery reveals mild atherosclerosis with no significantly elevated velocities consistent  with mild stenosis, 1 to 39%. Antegrade flow bilateral vertebral arteries. Bilateral subclavian  arterial signals are normal consistent with no significant arterial occlusion.   TEE 09/22/20: IMPRESSIONS   1. Left ventricular ejection fraction, by estimation, is 55 to 60%. The  left ventricle has normal function.   2. Right ventricular systolic function is normal. The  right ventricular  size is mildly enlarged.   3. No left atrial/left atrial appendage thrombus was detected.   4. The mitral valve is normal in structure. No evidence of mitral valve  regurgitation.   5. The aortic valve is tricuspid. Aortic valve regurgitation is not   visualized. No aortic stenosis is present.   6. Agitated saline contrast bubble study was negative, with no evidence  of any interatrial shunt.   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 Dr. Lyman Bishop - seen 11/24/19   Protein-calorie malnutrition, mild (HCC) 09/11/2020   Restless leg syndrome 11/14/2016   Retinopathy    Right sided numbness    Stroke (HCC)    Stroke-like symptoms 10/29/2019   TIA (transient ischemic attack) 10/29/2019   Tinea pedis of both feet 03/12/2017   Ulcer of left lower extremity with fat layer exposed (HCC) 01/20/2020   Uncontrolled type 2 diabetes mellitus with diabetic polyneuropathy, without long-term current use of insulin 11/12/2016   noe meds and patient does not check blood sugar as of 09/08/23    Past Surgical History:  Procedure Laterality Date   AMPUTATION Left 12/20/2019   Procedure: AMPUTATION 5TH TOE;  Surgeon: Terance Hart, MD;  Location[facility] OR;  Service: Orthopedics;  Laterality: Left;   BASCILIC VEIN TRANSPOSITION Right 12/23/2022   Procedure: RIGHT FIRST STAGE BRACHIOBASILIC VEIN TRANSPOSITION;  Surgeon: Cephus Shelling, MD;  Location: [facility] OR;  Service: Vascular;  Laterality: Right;   BUBBLE  STUDY  09/22/2020   Procedure: BUBBLE STUDY;  Surgeon: Little Ishikawa, MD;  Location: [facility] ENDOSCOPY;  Service: Cardiovascular;;   CATARACT EXTRACTION Bilateral 02/2021   LOOP RECORDER INSERTION N/A 09/22/2020   Procedure: LOOP RECORDER INSERTION;  Surgeon: Regan Lemming, MD;  Location: [facility] INVASIVE CV LAB;  Service: Cardiovascular;  Laterality: N/A;   TEE WITHOUT CARDIOVERSION N/A 09/22/2020   Procedure: TRANSESOPHAGEAL ECHOCARDIOGRAM (TEE);  Surgeon: Little Ishikawa, MD;  Location: [facility] ENDOSCOPY;  Service: Cardiovascular;  Laterality: N/A;  LOOP    MEDICATIONS: No current facility-administered medications for this encounter.    Accu-Chek Softclix Lancets lancets   amLODipine (NORVASC) 5 MG tablet   aspirin EC 81 MG tablet   atorvastatin (LIPITOR) 80 MG tablet   Blood Glucose Monitoring Suppl (ACCU-CHEK GUIDE) w/Device KIT   carvedilol (COREG) 6.25 MG tablet   chlorthalidone (HYGROTON) 25 MG tablet   ezetimibe (ZETIA) 10 MG tablet   famotidine (PEPCID) 20 MG tablet   ferric citrate (AURYXIA) 1 GM 210 MG(Fe) tablet   glucose blood (ACCU-CHEK GUIDE) test strip   tamsulosin (FLOMAX) 0.4 MG CAPS capsule    Shonna Chock, PA-C Surgical Short Stay/Anesthesiology [facility] Phone [phone] [facility] Phone [phone] 09/09/2023 3:05 PM

## 2023-09-10 ENCOUNTER — Ambulatory Visit (HOSPITAL_BASED_OUTPATIENT_CLINIC_OR_DEPARTMENT_OTHER)

## 2023-09-10 ENCOUNTER — Other Ambulatory Visit: Payer: Self-pay

## 2023-09-10 ENCOUNTER — Encounter (HOSPITAL_COMMUNITY)
Admission: RE | Disposition: A | Discharge status: Home / Self Care | Payer: Self-pay | Source: Home / Self Care | Attending: Vascular Surgery

## 2023-09-10 ENCOUNTER — Ambulatory Visit (HOSPITAL_COMMUNITY)

## 2023-09-10 ENCOUNTER — Encounter (HOSPITAL_COMMUNITY): Payer: Self-pay | Admitting: Vascular Surgery

## 2023-09-10 ENCOUNTER — Other Ambulatory Visit (HOSPITAL_COMMUNITY): Payer: Self-pay

## 2023-09-10 ENCOUNTER — Ambulatory Visit (HOSPITAL_COMMUNITY)
Admission: RE | Admit: 2023-09-10 | Discharge: 2023-09-10 | Disposition: A | Discharge status: Home / Self Care | Attending: Vascular Surgery | Admitting: Vascular Surgery

## 2023-09-10 DIAGNOSIS — E1122 Type 2 diabetes mellitus with diabetic chronic kidney disease: Secondary | ICD-10-CM

## 2023-09-10 DIAGNOSIS — Z992 Dependence on renal dialysis: Secondary | ICD-10-CM

## 2023-09-10 DIAGNOSIS — N186 End stage renal disease: Secondary | ICD-10-CM

## 2023-09-10 DIAGNOSIS — I120 Hypertensive chronic kidney disease with stage 5 chronic kidney disease or end stage renal disease: Secondary | ICD-10-CM

## 2023-09-10 DIAGNOSIS — Z79899 Other long term (current) drug therapy: Secondary | ICD-10-CM | POA: Insufficient documentation

## 2023-09-10 DIAGNOSIS — E1151 Type 2 diabetes mellitus with diabetic peripheral angiopathy without gangrene: Secondary | ICD-10-CM | POA: Insufficient documentation

## 2023-09-10 DIAGNOSIS — N185 Chronic kidney disease, stage 5: Secondary | ICD-10-CM | POA: Diagnosis not present

## 2023-09-10 DIAGNOSIS — K219 Gastro-esophageal reflux disease without esophagitis: Secondary | ICD-10-CM | POA: Diagnosis not present

## 2023-09-10 DIAGNOSIS — Z7982 Long term (current) use of aspirin: Secondary | ICD-10-CM | POA: Diagnosis not present

## 2023-09-10 DIAGNOSIS — E785 Hyperlipidemia, unspecified: Secondary | ICD-10-CM | POA: Diagnosis not present

## 2023-09-10 HISTORY — PX: BASCILIC VEIN TRANSPOSITION: SHX5742

## 2023-09-10 LAB — POCT I-STAT, CHEM 8
BUN: 36 mg/dL — ABNORMAL HIGH (ref 8–23)
Calcium, Ion: 1.09 mmol/L — ABNORMAL LOW (ref 1.15–1.40)
Chloride: 98 mmol/L (ref 98–111)
Creatinine, Ser: 7.7 mg/dL — ABNORMAL HIGH (ref 0.61–1.24)
Glucose, Bld: 96 mg/dL (ref 70–99)
HCT: 35 % — ABNORMAL LOW (ref 39.0–52.0)
Hemoglobin: 11.9 g/dL — ABNORMAL LOW (ref 13.0–17.0)
Potassium: 3.2 mmol/L — ABNORMAL LOW (ref 3.5–5.1)
Sodium: 139 mmol/L (ref 135–145)
TCO2: 26 mmol/L (ref 22–32)

## 2023-09-10 LAB — GLUCOSE, CAPILLARY
Glucose-Capillary: 99 mg/dL (ref 70–99)
Glucose-Capillary: 93 mg/dL (ref 70–99)
Glucose-Capillary: 118 mg/dL — ABNORMAL HIGH (ref 70–99)

## 2023-09-10 SURGERY — TRANSPOSITION, VEIN, BASILIC
Anesthesia: Regional | Site: Arm Upper | Laterality: Right

## 2023-09-10 MED ORDER — HEMOSTATIC AGENTS (NO CHARGE) OPTIME
TOPICAL | Status: DC | PRN
  Administered 2023-09-10: 1 via TOPICAL

## 2023-09-10 MED ORDER — SODIUM CHLORIDE 0.9 % IV SOLN: INTRAVENOUS | Status: DC

## 2023-09-10 MED ORDER — EPHEDRINE 5 MG/ML INJ
INTRAVENOUS | Status: AC
  Filled 2023-09-10: qty 10

## 2023-09-10 MED ORDER — OXYCODONE HCL 5 MG PO TABS: 5.0000 mg | ORAL_TABLET | Freq: Once | ORAL | Status: AC | PRN

## 2023-09-10 MED ORDER — FENTANYL CITRATE (PF) 100 MCG/2ML IJ SOLN
INTRAMUSCULAR | Status: AC
  Administered 2023-09-10: 100 ug via INTRAVENOUS
  Filled 2023-09-10: qty 2

## 2023-09-10 MED ORDER — CHLORHEXIDINE GLUCONATE 4 % EX SOLN
60.0000 mL | Freq: Once | CUTANEOUS | Status: DC
Start: 2023-09-11 — End: 2023-09-10

## 2023-09-10 MED ORDER — FENTANYL CITRATE (PF) 100 MCG/2ML IJ SOLN
INTRAMUSCULAR | Status: AC
  Filled 2023-09-10: qty 2

## 2023-09-10 MED ORDER — PROTAMINE SULFATE 10 MG/ML IV SOLN
INTRAVENOUS | Status: AC
  Filled 2023-09-10: qty 5

## 2023-09-10 MED ORDER — HEPARIN SODIUM (PORCINE) 1000 UNIT/ML IJ SOLN
INTRAMUSCULAR | Status: AC
  Filled 2023-09-10: qty 20

## 2023-09-10 MED ORDER — INSULIN ASPART 100 UNIT/ML IJ SOLN: 0.0000 [IU] | INTRAMUSCULAR | Status: DC | PRN

## 2023-09-10 MED ORDER — LABETALOL HCL 5 MG/ML IV SOLN
INTRAVENOUS | Status: AC
  Filled 2023-09-10: qty 4

## 2023-09-10 MED ORDER — LIDOCAINE-EPINEPHRINE (PF) 2 %-1:200000 IJ SOLN
INTRAMUSCULAR | Status: DC | PRN
  Administered 2023-09-10: 30 mL via PERINEURAL

## 2023-09-10 MED ORDER — CEFAZOLIN SODIUM 1 G IJ SOLR
INTRAMUSCULAR | Status: AC
  Filled 2023-09-10: qty 10

## 2023-09-10 MED ORDER — FENTANYL CITRATE (PF) 100 MCG/2ML IJ SOLN
25.0000 ug | INTRAMUSCULAR | Status: DC | PRN
  Administered 2023-09-10: 50 ug via INTRAVENOUS

## 2023-09-10 MED ORDER — OXYCODONE HCL 5 MG/5ML PO SOLN
ORAL | Status: AC
  Filled 2023-09-10: qty 5

## 2023-09-10 MED ORDER — FENTANYL CITRATE (PF) 100 MCG/2ML IJ SOLN: 100.0000 ug | Freq: Once | INTRAMUSCULAR | Status: AC

## 2023-09-10 MED ORDER — SODIUM CHLORIDE 0.9% FLUSH: 3.0000 mL | INTRAVENOUS | Status: DC | PRN

## 2023-09-10 MED ORDER — EPHEDRINE 5 MG/ML INJ
INTRAVENOUS | Status: AC
  Filled 2023-09-10: qty 5

## 2023-09-10 MED ORDER — CHLORHEXIDINE GLUCONATE 0.12 % MT SOLN
15.0000 mL | Freq: Once | OROMUCOSAL | Status: AC
  Administered 2023-09-10: 15 mL via OROMUCOSAL
  Filled 2023-09-10: qty 15

## 2023-09-10 MED ORDER — 0.9 % SODIUM CHLORIDE (POUR BTL) OPTIME
TOPICAL | Status: DC | PRN
  Administered 2023-09-10: 1000 mL

## 2023-09-10 MED ORDER — ORAL CARE MOUTH RINSE: 15.0000 mL | Freq: Once | OROMUCOSAL | Status: AC

## 2023-09-10 MED ORDER — GLYCOPYRROLATE PF 0.2 MG/ML IJ SOSY
PREFILLED_SYRINGE | INTRAMUSCULAR | Status: AC
  Filled 2023-09-10: qty 1

## 2023-09-10 MED ORDER — OXYCODONE HCL 5 MG/5ML PO SOLN
5.0000 mg | Freq: Once | ORAL | Status: AC | PRN
  Administered 2023-09-10: 5 mg via ORAL

## 2023-09-10 MED ORDER — HEPARIN 6000 UNIT IRRIGATION SOLUTION
Status: AC
  Filled 2023-09-10: qty 500

## 2023-09-10 MED ORDER — EPHEDRINE SULFATE-NACL 50-0.9 MG/10ML-% IV SOSY
PREFILLED_SYRINGE | INTRAVENOUS | Status: DC | PRN
  Administered 2023-09-10: 10 mg via INTRAVENOUS
  Administered 2023-09-10: 5 mg via INTRAVENOUS

## 2023-09-10 MED ORDER — MIDAZOLAM HCL 2 MG/2ML IJ SOLN: 0.5000 mg | Freq: Once | INTRAMUSCULAR | Status: DC | PRN

## 2023-09-10 MED ORDER — PROPOFOL 500 MG/50ML IV EMUL
INTRAVENOUS | Status: DC | PRN
  Administered 2023-09-10: 50 ug/kg/min via INTRAVENOUS

## 2023-09-10 MED ORDER — HEPARIN 6000 UNIT IRRIGATION SOLUTION
Status: DC | PRN
  Administered 2023-09-10: 1

## 2023-09-10 MED ORDER — MIDAZOLAM HCL 2 MG/2ML IJ SOLN
INTRAMUSCULAR | Status: AC
  Filled 2023-09-10: qty 2

## 2023-09-10 MED ORDER — PROPOFOL 10 MG/ML IV BOLUS
INTRAVENOUS | Status: AC
  Filled 2023-09-10: qty 20

## 2023-09-10 MED ORDER — CHLORHEXIDINE GLUCONATE 4 % EX SOLN
60.0000 mL | Freq: Once | CUTANEOUS | Status: DC
Start: 2023-09-10 — End: 2023-09-10

## 2023-09-10 MED ORDER — HEPARIN SODIUM (PORCINE) 1000 UNIT/ML IJ SOLN
INTRAMUSCULAR | Status: DC | PRN
  Administered 2023-09-10: 5000 [IU] via INTRAVENOUS

## 2023-09-10 MED ORDER — OXYCODONE-ACETAMINOPHEN 5-325 MG PO TABS
1.0000 | ORAL_TABLET | Freq: Four times a day (QID) | ORAL | 0 refills | Status: AC | PRN
  Filled 2023-09-10: qty 20, 5d supply, fill #0

## 2023-09-10 MED ORDER — CEFAZOLIN SODIUM-DEXTROSE 2-4 GM/100ML-% IV SOLN
2.0000 g | INTRAVENOUS | Status: AC
  Administered 2023-09-10: 2 g via INTRAVENOUS
  Filled 2023-09-10: qty 100

## 2023-09-10 MED ORDER — FENTANYL CITRATE (PF) 250 MCG/5ML IJ SOLN
INTRAMUSCULAR | Status: AC
Start: 2023-09-10 — End: ?
  Filled 2023-09-10: qty 5

## 2023-09-10 MED ORDER — MIDAZOLAM HCL 2 MG/2ML IJ SOLN
INTRAMUSCULAR | Status: AC
  Administered 2023-09-10: 1 mg via INTRAVENOUS
  Filled 2023-09-10: qty 2

## 2023-09-10 MED ORDER — MIDAZOLAM HCL 2 MG/2ML IJ SOLN: 1.0000 mg | Freq: Once | INTRAMUSCULAR | Status: AC

## 2023-09-10 MED ORDER — ACETAMINOPHEN 500 MG PO TABS
1000.0000 mg | ORAL_TABLET | Freq: Once | ORAL | Status: AC
  Administered 2023-09-10: 1000 mg via ORAL
  Filled 2023-09-10: qty 2

## 2023-09-10 SURGICAL SUPPLY — 36 items
ARMBAND PINK RESTRICT EXTREMIT (MISCELLANEOUS) ×1 IMPLANT
BAG COUNTER SPONGE SURGICOUNT (BAG) ×1 IMPLANT
CANISTER SUCT 3000ML PPV (MISCELLANEOUS) ×1 IMPLANT
CLIP TI MEDIUM 24 (CLIP) ×1 IMPLANT
CLIP TI WIDE RED SMALL 24 (CLIP) ×1 IMPLANT
COVER PROBE W GEL 5X96 (DRAPES) ×1 IMPLANT
DERMABOND ADVANCED .7 DNX12 (GAUZE/BANDAGES/DRESSINGS) ×1 IMPLANT
DERMABOND ADVANCED .7 DNX6 (GAUZE/BANDAGES/DRESSINGS) IMPLANT
ELECT REM PT RETURN 9FT ADLT (ELECTROSURGICAL) ×1 IMPLANT
ELECTRODE REM PT RTRN 9FT ADLT (ELECTROSURGICAL) ×1 IMPLANT
GLOVE BIO SURGEON STRL SZ7.5 (GLOVE) ×1 IMPLANT
GLOVE BIOGEL PI IND STRL 8 (GLOVE) ×1 IMPLANT
GOWN STRL REUS W/ TWL LRG LVL3 (GOWN DISPOSABLE) ×2 IMPLANT
GOWN STRL REUS W/ TWL XL LVL3 (GOWN DISPOSABLE) ×2 IMPLANT
HEMOSTAT SPONGE AVITENE ULTRA (HEMOSTASIS) IMPLANT
KIT BASIN OR (CUSTOM PROCEDURE TRAY) ×1 IMPLANT
KIT TURNOVER KIT B (KITS) ×1 IMPLANT
NDL HYPO 25GX1X1/2 BEV (NEEDLE) ×1 IMPLANT
NEEDLE HYPO 25GX1X1/2 BEV (NEEDLE) ×1 IMPLANT
NS IRRIG 1000ML POUR BTL (IV SOLUTION) ×1 IMPLANT
PACK CV ACCESS (CUSTOM PROCEDURE TRAY) ×1 IMPLANT
PAD ARMBOARD POSITIONER FOAM (MISCELLANEOUS) ×2 IMPLANT
POWDER SURGICEL 3.0 GRAM (HEMOSTASIS) IMPLANT
SLING ARM FOAM STRAP LRG (SOFTGOODS) IMPLANT
SLING ARM FOAM STRAP MED (SOFTGOODS) IMPLANT
SPIKE FLUID TRANSFER (MISCELLANEOUS) ×1 IMPLANT
SUT MNCRL AB 4-0 PS2 18 (SUTURE) ×1 IMPLANT
SUT PROLENE 6 0 BV (SUTURE) ×1 IMPLANT
SUT PROLENE 7 0 BV 1 (SUTURE) IMPLANT
SUT SILK 2 0 SH (SUTURE) IMPLANT
SUT SILK 2-0 18XBRD TIE 12 (SUTURE) IMPLANT
SUT VIC AB 2-0 CT1 TAPERPNT 27 (SUTURE) ×1 IMPLANT
SUT VIC AB 3-0 SH 27X BRD (SUTURE) ×2 IMPLANT
TOWEL GREEN STERILE (TOWEL DISPOSABLE) ×1 IMPLANT
UNDERPAD 30X36 HEAVY ABSORB (UNDERPADS AND DIAPERS) ×1 IMPLANT
WATER STERILE IRR 1000ML POUR (IV SOLUTION) ×1 IMPLANT

## 2023-09-10 NOTE — Discharge Instructions (Signed)
[facility] [place] [facility]  Discharge Instructions  AV Fistula or Graft Surgery for Dialysis Access  Please refer to the following instructions for your post-procedure care. Your surgeon or physician assistant will discuss any changes with you.  Activity  You may drive the day following your surgery, if you are comfortable and no longer taking prescription pain medication. Resume full activity as the soreness in your incision resolves.  Bathing/Showering  You may shower after you go home. Keep your incision dry for 48 hours. Do not soak in a bathtub, hot tub, or swim until the incision heals completely. You may not shower if you have a hemodialysis catheter.  Incision Care  Clean your incision with mild soap and water after 48 hours. Pat the area dry with a clean towel. You do not need a bandage unless otherwise instructed. Do not apply any ointments or creams to your incision. You may have skin glue on your incision. Do not peel it off. It will come off on its own in about one week. Your arm may swell a bit after surgery. To reduce swelling use pillows to elevate your arm so it is above your heart. Your doctor will tell you if you need to lightly wrap your arm with an ACE bandage.  Diet  Resume your normal diet. There are not special food restrictions following this procedure. In order to heal from your surgery, it is CRITICAL to get adequate nutrition. Your body requires vitamins, minerals, and protein. Vegetables are the best source of vitamins and minerals. Vegetables also provide the perfect balance of protein. Processed food has little nutritional value, so try to avoid this.  Medications  Resume taking all of your medications. If your incision is causing pain, you may take over-the counter pain relievers such as acetaminophen (Tylenol). If you were prescribed a stronger pain medication, please be aware these medications can cause nausea and constipation. Prevent  nausea by taking the medication with a snack or meal. Avoid constipation by drinking plenty of fluids and eating foods with high amount of fiber, such as fruits, vegetables, and grains.  Do not take Tylenol if you are taking prescription pain medications.  Follow up Your surgeon may want to see you in the office following your access surgery. If so, this will be arranged at the time of your surgery.  Please call us  immediately for any of the following conditions:  Increased pain, redness, drainage (pus) from your incision site Fever of 101 degrees or higher Severe or worsening pain at your incision site Hand pain or numbness.  Reduce your risk of vascular disease:  Stop smoking. If you would like help, call [facility] at 1-800-QUIT-NOW (972 665 0746) or [place] at (419)815-6307  Manage your cholesterol Maintain a desired weight Control your diabetes Keep your blood pressure down  Dialysis  It will take several weeks to several months for your new dialysis access to be ready for use. Your surgeon will determine when it is okay to use it. Your nephrologist will continue to direct your dialysis. You can continue to use your Permcath until your new access is ready for use.   09/10/2023 Douglas Briggs [id number] Oct 21, 1960  Surgeon(s): Young Hensen, MD  Procedure(s): RIGHT SECOND STAGE BASILIC VEIN TRANSPOSITION   May stick graft immediately   May stick graft on designated area only:   X Do not stick right AV fistula for 6 weeks    If you have any questions, please call the office at  [phone]. 

## 2023-09-10 NOTE — Anesthesia Postprocedure Evaluation (Signed)
 Anesthesia Post Note  Patient: Douglas Briggs  Procedure(s) Performed: RIGHT SECOND STAGE BASILIC VEIN TRANSPOSITION (Right: Arm Upper)     Patient location during evaluation: PACU Anesthesia Type: Regional Level of consciousness: awake and alert, patient cooperative and oriented Pain management: pain level controlled Vital Signs Assessment: post-procedure vital signs reviewed and stable Respiratory status: nonlabored ventilation, spontaneous breathing and respiratory function stable Cardiovascular status: blood pressure returned to baseline and stable Postop Assessment: no apparent nausea or vomiting, able to ambulate and adequate PO intake Anesthetic complications: no   No notable events documented.  Last Vitals:  Vitals:   09/10/23 1430 09/10/23 1445  BP: 125/65 126/63  Pulse: (!) 55 (!) 54  Resp: (!) 7 16  Temp:  (!) 36.4 C  SpO2: 95% 98%    Last Pain:  Vitals:   09/10/23 1415  TempSrc:   PainSc: 7                  Dontavion Noxon,E. Aden Youngman

## 2023-09-10 NOTE — H&P (Signed)
 POST OPERATIVE OFFICE NOTE       CC:  F/u for surgery   HPI:  This is a 63 y.o. male who is s/p right 1st stage BVT on 12/23/2022 by Dr. Chestine Spore. He has TDC that was placed at [facility] 11/04/2022.   Pt is here with his daughter.  He states he does not have pain/numbness in the right hand.         Allergies  No Known Allergies           Current Outpatient Medications  Medication Sig Dispense Refill   Accu-Chek Softclix Lancets lancets Use as instructed to check BS once a day 100 each 12   amLODipine (NORVASC) 5 MG tablet Take 1 tablet (5 mg total) by mouth daily. 90 tablet 1   aspirin EC 81 MG tablet Take 81 mg by mouth daily. Swallow whole.       atorvastatin (LIPITOR) 80 MG tablet Take 1 tablet (80 mg total) by mouth daily in the afternoon. 90 tablet 1   Blood Glucose Monitoring Suppl (ACCU-CHEK GUIDE) w/Device KIT Check BS once a day 1 kit 0   carvedilol (COREG) 6.25 MG tablet TAKE 1 TABLET BY MOUTH 2 TIMES DAILY WITH A MEAL. 180 tablet 0   ezetimibe (ZETIA) 10 MG tablet TAKE 1 TABLET BY MOUTH EVERY DAY 90 tablet 0   famotidine (PEPCID) 20 MG tablet Take 20 mg by mouth 2 (two) times daily as needed for heartburn or indigestion.       ferric citrate (AURYXIA) 1 GM 210 MG(Fe) tablet Take 210 mg by mouth 3 (three) times daily with meals.       glucose blood (ACCU-CHEK GUIDE) test strip Use as instructed to check blood sugars once a day. 100 each 12   tamsulosin (FLOMAX) 0.4 MG CAPS capsule TAKE 1 CAPSULE BY MOUTH EVERY DAY AFTER SUPPER 90 capsule 0      No current facility-administered medications for this visit.         ROS:  See HPI   Physical Exam:      Today's Vitals    06/24/23 1307  BP: 102/66  Pulse: 81  Resp: 18  Temp: 98 F (36.7 C)  TempSrc: Temporal  SpO2: 98%  Weight: 134 lb 4.8 oz (60.9 kg)  Height: 5\' 8"  (1.727 m)  PainSc: 0-No pain    Body mass index is 20.42 kg/m.     Incision:  healed nicely Extremities:   There is a palpable right radial  pulse.   Motor and sensory are in tact.   There is a thrill present.  Access is  easily palpable Monophasic doppler flow right pero/PT; faint left peroneal Bilateral femoral pulses are palpable   Right foot ulcer:    Abdomen: soft; aortic pulse is palpable Cardiac:  regular; no carotid bruits appreciated.      Dialysis Duplex on 06/24/2023: Findings:  +--------------------+----------+-----------------+--------+  AVF                PSV (cm/s)Flow Vol (mL/min)Comments  +--------------------+----------+-----------------+--------+  Native artery inflow   213           683                 +--------------------+----------+-----------------+--------+  AVF Anastomosis        393                               +--------------------+----------+-----------------+--------+     +------------+----------+-------------+----------+-------------------------  OUTFLOW VEIN  Diameter (cm)Depth (cm)      Describe        +------------+----------+-------------+----------+-------------------------  Prox UA        100        0.87        0.46          Retained valve     +------------+----------+-------------+----------+-------------------------  Mid UA         133        0.80        0.49    multiple competing Branches largest measuring 0.29cm     +------------+----------+-------------+----------+-------------------------  Dist UA        100        0.98        0.31                             +------------+----------+-------------+----------+-------------------------  AC Fossa       618        0.46        0.34             stenotic        -------------------------------------------------------------------------------       Assessment/Plan:  This is a 63 y.o. male who is s/p:  right 1st stage BVT on 12/23/2022 by Dr. Fulton Job.    Discussed plan for right second stage basilic vein transposition today.  Will need 1 month to heal and then he can start using the dialysis  access.  Risk-benefit discussed.  Questions answered.   Young Hensen, MD [facility] [place] Office: [phone]   Young Hensen

## 2023-09-10 NOTE — Op Note (Signed)
    OPERATIVE NOTE  DATE: September 10, 2023  PROCEDURE: right second stage basilic vein transposition (brachiobasilic arteriovenous fistula) placement  PRE-OPERATIVE DIAGNOSIS: ESRD  POST-OPERATIVE DIAGNOSIS: same  SURGEON: Young Hensen, MD  ASSISTANT(S): Wynonia Hedges, PA  ANESTHESIA: regional  ESTIMATED BLOOD LOSS: <75 mL  FINDING(S): The right arm basilic vein was fully mobilized through three skip incisions in the right upper arm.  Ultimately this was transected near the antecubital crease.  A new skin tunnel was created.  After the basilic vein was retunneled a new end-to-end primary anastomosis was performed.  Excellent thrill at completion.  SPECIMEN(S):  None  INDICATIONS:   Douglas Briggs is a 63 y.o. male who presents with ESRD and the need for permanent hemodialysis access.  The patient is scheduled for right second stage basilic vein transposition.  The patient is aware the risks include but are not limited to: bleeding, infection, steal syndrome, nerve damage, ischemic monomelic neuropathy, failure to mature, and need for additional procedures.  The patient is aware of the risks of the procedure and elects to proceed forward.  An assistant was needed given the complexity case and also for mobilization of the basilic vein and the anastomosis.   DESCRIPTION: After full informed written consent was obtained from the patient, the patient was brought back to the operating room and placed supine upon the operating table.  Prior to induction, the patient received IV antibiotics.   After obtaining adequate anesthesia, the patient was then prepped and draped in the standard fashion for a right arm access procedure.  I turned my attention first to identifying the patient's brachiobasilic arteriovenous fistula.  Using SonoSite guidance, the location of this fistula was marked out on the skin.    This was an excellent caliber vein.  I made three longitudinal incisions on the medial  aspect of the right upper arm.  Through these incisions I dissected out circumferentially the basilic vein, taking care to protect the nerve.  Once the vein was fully mobilized, all side branches were ligated between 2-0 silk ties and divided.  The vein was marked for orientation.  I then used a curved tunneler to create a subcutaneous tunnel.  The patient was given 5,000 units IV heparin.  The vein was then transected near the antecubital crease.  It was then brought to the previously created tunnel making sure to maintain proper orientation.  A primary anastomosis was then performed between the two cut ends of the vein with a running 6-0 Prolene with the help of my assistant.  Once this was done the clamps were released.  There was excellent flow through the fistula.  Hemostasis was then achieved.  The wound was irrigated.  The incision was closed with a deep layer of 3-0 Vicryl followed by a subcutaneous 4-0 Monocryl and Dermabond.  There were no immediate complications.  COMPLICATIONS: None  CONDITION: Stable  Young Hensen, MD [facility] Office: [phone]  Young Hensen   09/10/2023, 1:39 PM

## 2023-09-10 NOTE — Anesthesia Procedure Notes (Signed)
 Procedure Name: MAC Date/Time: 09/10/2023 11:54 AM  Performed by: Pauletta Boroughs, CRNAPre-anesthesia Checklist: Patient identified, Emergency Drugs available, Suction available, Timeout performed and Patient being monitored Patient Re-evaluated:Patient Re-evaluated prior to induction Oxygen Delivery Method: Simple face mask

## 2023-09-10 NOTE — Anesthesia Procedure Notes (Signed)
 Anesthesia Regional Block: Interscalene brachial plexus block   Pre-Anesthetic Checklist: , timeout performed,  Correct Patient, Correct Site, Correct Laterality,  Correct Procedure, Correct Position, site marked,  Risks and benefits discussed,  Surgical consent,  Pre-op evaluation,  At surgeon's request and post-op pain management  Laterality: Right and Upper  Prep: chloraprep       Needles:  Injection technique: Single-shot  Needle Type: Echogenic Needle     Needle Length: 9cm  Needle Gauge: 21     Additional Needles:   Procedures:,,,, ultrasound used (permanent image in chart),,    Narrative:  Start time: 09/10/2023 10:54 AM End time: 09/10/2023 11:02 AM Injection made incrementally with aspirations every 5 mL.  Performed by: Personally  Anesthesiologist: Jonne Netters, MD  Additional Notes: Pt identified in Holding room.  Monitors applied. Working IV access confirmed. Timeout, Sterile prep R clavicle and neck.  #21ga ECHOgenic Arrow block needle to interscalene brachial plexus with US  guidance.  30cc 1.5% Lidocaine 1:200k epi injected incrementally after negative test dose.  Patient asymptomatic, VSS, no heme aspirated, tolerated well.   Fay Hoop, MD

## 2023-09-10 NOTE — Transfer of Care (Signed)
 Immediate Anesthesia Transfer of Care Note  Patient: Douglas Briggs  Procedure(s) Performed: RIGHT SECOND STAGE BASILIC VEIN TRANSPOSITION (Right: Arm Upper)  Patient Location: PACU  Anesthesia Type:MAC combined with regional for post-op pain  Level of Consciousness: awake, alert , oriented, and patient cooperative  Airway & Oxygen Therapy: Patient Spontanous Breathing and Patient connected to face mask oxygen  Post-op Assessment: Report given to RN, Post -op Vital signs reviewed and stable, and Patient moving all extremities X 4  Post vital signs: Reviewed and stable  Last Vitals:  Vitals Value Taken Time  BP 125/64 09/10/23 1349  Temp 36.4 C 09/10/23 1349  Pulse 59 09/10/23 1352  Resp 14 09/10/23 1352  SpO2 100 % 09/10/23 1352  Vitals shown include unfiled device data.  Last Pain:  Vitals:   09/10/23 1105  TempSrc:   PainSc: 8       Patients Stated Pain Goal: 3 (09/10/23 0945)  Complications: No notable events documented.

## 2023-09-11 ENCOUNTER — Encounter (HOSPITAL_COMMUNITY): Payer: Self-pay | Admitting: Vascular Surgery

## 2023-09-11 ENCOUNTER — Encounter (HOSPITAL_COMMUNITY)

## 2023-09-16 ENCOUNTER — Ambulatory Visit: Admitting: Vascular Surgery

## 2023-09-18 DIAGNOSIS — Z466 Encounter for fitting and adjustment of urinary device: Secondary | ICD-10-CM | POA: Diagnosis not present

## 2023-09-18 DIAGNOSIS — N186 End stage renal disease: Secondary | ICD-10-CM | POA: Diagnosis not present

## 2023-09-18 DIAGNOSIS — Z978 Presence of other specified devices: Secondary | ICD-10-CM | POA: Diagnosis not present

## 2023-09-18 DIAGNOSIS — Z992 Dependence on renal dialysis: Secondary | ICD-10-CM | POA: Diagnosis not present

## 2023-09-18 DIAGNOSIS — R339 Retention of urine, unspecified: Secondary | ICD-10-CM | POA: Diagnosis not present

## 2023-09-24 DIAGNOSIS — Z992 Dependence on renal dialysis: Secondary | ICD-10-CM | POA: Diagnosis not present

## 2023-09-24 DIAGNOSIS — N186 End stage renal disease: Secondary | ICD-10-CM | POA: Diagnosis not present

## 2023-09-24 DIAGNOSIS — I120 Hypertensive chronic kidney disease with stage 5 chronic kidney disease or end stage renal disease: Secondary | ICD-10-CM | POA: Diagnosis not present

## 2023-09-29 ENCOUNTER — Other Ambulatory Visit: Payer: Self-pay | Admitting: Internal Medicine

## 2023-09-29 DIAGNOSIS — E1159 Type 2 diabetes mellitus with other circulatory complications: Secondary | ICD-10-CM

## 2023-09-29 DIAGNOSIS — E113299 Type 2 diabetes mellitus with mild nonproliferative diabetic retinopathy without macular edema, unspecified eye: Secondary | ICD-10-CM

## 2023-09-29 DIAGNOSIS — I6930 Unspecified sequelae of cerebral infarction: Secondary | ICD-10-CM

## 2023-10-06 ENCOUNTER — Encounter (HOSPITAL_COMMUNITY): Payer: Self-pay

## 2023-10-07 ENCOUNTER — Ambulatory Visit: Attending: Vascular Surgery | Admitting: Physician Assistant

## 2023-10-07 VITALS — BP 110/69 | HR 77 | Temp 98.4°F | Wt 130.0 lb

## 2023-10-07 DIAGNOSIS — Z992 Dependence on renal dialysis: Secondary | ICD-10-CM

## 2023-10-07 DIAGNOSIS — N186 End stage renal disease: Secondary | ICD-10-CM

## 2023-10-07 NOTE — Progress Notes (Signed)
  POST OPERATIVE OFFICE NOTE    CC:  F/u for surgery  HPI:  This is a 63 y.o. male who is s/p Second stage basilic transposition on 09/10/23 by Dr. Fulton Job.    He is previously s/p right 1st stage BVT on 12/23/2022 by Dr. Fulton Job. He has TDC that was placed at [facility] 11/04/2022.    Pt returns today for follow up.  Pt states he has HD on MWF at [facility].  He denies pain, loss of sensation and loss of motor in the right UE.     No Known Allergies  Current Outpatient Medications  Medication Sig Dispense Refill   Accu-Chek Softclix Lancets lancets Use as instructed to check BS once a day 100 each 12   amLODipine  (NORVASC ) 5 MG tablet TAKE 1 TABLET (5 MG TOTAL) BY MOUTH DAILY. PLEASE SCHEDULE AN APPOINTMENT WITH DR. Lincoln Renshaw. 30 tablet 0   aspirin  EC 81 MG tablet Take 325 mg by mouth daily. Swallow whole.     atorvastatin  (LIPITOR ) 80 MG tablet TAKE 1 TABLET (80 MG) BY MOUTH DAILY IN THE AFTERNOON. PLEASE SCHEDULE APPOINTMENT WITH DR. Lincoln Renshaw. 30 tablet 0   Blood Glucose Monitoring Suppl (ACCU-CHEK GUIDE) w/Device KIT Check BS once a day 1 kit 0   carvedilol  (COREG ) 6.25 MG tablet TAKE 1 TABLET BY MOUTH 2 TIMES DAILY WITH A MEAL. 180 tablet 0   chlorthalidone  (HYGROTON ) 25 MG tablet Take 25 mg by mouth daily.     ezetimibe  (ZETIA ) 10 MG tablet TAKE 1 TABLET BY MOUTH EVERY DAY 90 tablet 0   famotidine  (PEPCID ) 20 MG tablet Take 20 mg by mouth 2 (two) times daily as needed for heartburn or indigestion.     ferric citrate (AURYXIA) 1 GM 210 MG(Fe) tablet Take 210 mg by mouth 3 (three) times daily with meals.     glucose blood (ACCU-CHEK GUIDE) test strip Use as instructed to check blood sugars once a day. 100 each 12   oxyCODONE -acetaminophen  (PERCOCET) 5-325 MG tablet Take 1 tablet by mouth every 6 (six) hours as needed for severe pain (pain score 7-10). 20 tablet 0   tamsulosin  (FLOMAX ) 0.4 MG CAPS capsule TAKE 1 CAPSULE BY MOUTH EVERY DAY AFTER SUPPER 90 capsule 0   No current  facility-administered medications for this visit.     ROS:  See HPI  Physical Exam:    Incision:  Well healed incisions. Extremities:  palpable thrill throughout the fistula. Neuro: sensation/motor intact     Assessment/Plan:  This is a 63 y.o. male who is s/p: 09/10/23 right second stage Basilic transposition.  The fistula may be access Monday 10/13/23.    He will f/u PRN.  The catheter can be removed by [facility] once the fistula is deemed as working well.     Rocky Cipro PA-C [facility] [phone]   Clinic MD:  Fulton Job

## 2023-10-16 DIAGNOSIS — R339 Retention of urine, unspecified: Secondary | ICD-10-CM | POA: Diagnosis not present

## 2023-10-25 DIAGNOSIS — N186 End stage renal disease: Secondary | ICD-10-CM | POA: Diagnosis not present

## 2023-10-25 DIAGNOSIS — Z992 Dependence on renal dialysis: Secondary | ICD-10-CM | POA: Diagnosis not present

## 2023-10-25 DIAGNOSIS — I120 Hypertensive chronic kidney disease with stage 5 chronic kidney disease or end stage renal disease: Secondary | ICD-10-CM | POA: Diagnosis not present

## 2023-10-28 ENCOUNTER — Encounter (HOSPITAL_COMMUNITY)

## 2023-10-31 ENCOUNTER — Other Ambulatory Visit: Payer: Self-pay | Admitting: Internal Medicine

## 2023-10-31 DIAGNOSIS — E1159 Type 2 diabetes mellitus with other circulatory complications: Secondary | ICD-10-CM

## 2023-10-31 DIAGNOSIS — E113299 Type 2 diabetes mellitus with mild nonproliferative diabetic retinopathy without macular edema, unspecified eye: Secondary | ICD-10-CM

## 2023-10-31 DIAGNOSIS — I6930 Unspecified sequelae of cerebral infarction: Secondary | ICD-10-CM

## 2023-11-04 ENCOUNTER — Ambulatory Visit (HOSPITAL_COMMUNITY)
Admission: RE | Admit: 2023-11-04 | Discharge: 2023-11-04 | Disposition: A | Discharge status: Home / Self Care | Source: Ambulatory Visit | Attending: Vascular Surgery | Admitting: Vascular Surgery

## 2023-11-04 ENCOUNTER — Encounter: Payer: Self-pay | Admitting: Vascular Surgery

## 2023-11-04 ENCOUNTER — Ambulatory Visit: Attending: Vascular Surgery | Admitting: Vascular Surgery

## 2023-11-04 ENCOUNTER — Other Ambulatory Visit: Payer: Self-pay | Admitting: Internal Medicine

## 2023-11-04 VITALS — BP 118/63 | HR 61 | Temp 98.1°F | Resp 20 | Ht 68.0 in | Wt 130.0 lb

## 2023-11-04 DIAGNOSIS — I152 Hypertension secondary to endocrine disorders: Secondary | ICD-10-CM

## 2023-11-04 DIAGNOSIS — E113299 Type 2 diabetes mellitus with mild nonproliferative diabetic retinopathy without macular edema, unspecified eye: Secondary | ICD-10-CM

## 2023-11-04 DIAGNOSIS — I70221 Atherosclerosis of native arteries of extremities with rest pain, right leg: Secondary | ICD-10-CM | POA: Insufficient documentation

## 2023-11-04 DIAGNOSIS — I739 Peripheral vascular disease, unspecified: Secondary | ICD-10-CM | POA: Diagnosis not present

## 2023-11-04 DIAGNOSIS — I6930 Unspecified sequelae of cerebral infarction: Secondary | ICD-10-CM

## 2023-11-04 LAB — VAS US ABI WITH/WO TBI: Left ABI: 1.12

## 2023-11-04 NOTE — Progress Notes (Signed)
 Patient name: Douglas Briggs MRN: [id number] DOB: 06-Oct-1960 Sex: male  REASON FOR CONSULT: PAD  HPI: Douglas Briggs is a 63 y.o. male, with history of prior CVA, ESRD, diabetes, hypertension, hyperlipidemia that presents for evaluation of nonhealing ulcer on the right lateral foot.  Patient is here with his daughter.  This apparently occurred about 4.5 months ago in February.  This has been nonhealing.  They state there was some drainage there for period of time.  No prior right leg intervention.  Is known to us  for prior dialysis access. States his right arm AVF is working well.  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 Dr. Darina Edis - seen 11/24/19   Peripheral vascular disease (HCC)    Protein-calorie malnutrition, mild (HCC) 09/11/2020   Restless leg syndrome 11/14/2016   Retinopathy    Right sided numbness    Stroke (HCC)    Stroke-like symptoms 10/29/2019   TIA (transient ischemic attack) 10/29/2019   Tinea pedis of both feet 03/12/2017   Ulcer of left lower extremity with fat layer exposed (HCC) 01/20/2020   Uncontrolled type 2 diabetes mellitus with diabetic polyneuropathy, without long-term current use of insulin  11/12/2016   noe meds and  patient does not check blood sugar as of 09/08/23    Past Surgical History:  Procedure Laterality Date   AMPUTATION Left 12/20/2019   Procedure: AMPUTATION 5TH TOE;  Surgeon: Donnamarie Gables, MD;  Location: [facility] OR;  Service: Orthopedics;  Laterality: Left;   BASCILIC VEIN TRANSPOSITION Right 12/23/2022   Procedure: RIGHT FIRST STAGE BRACHIOBASILIC VEIN TRANSPOSITION;  Surgeon: Young Hensen, MD;  Location: [facility] OR;  Service: Vascular;  Laterality: Right;   BASCILIC VEIN TRANSPOSITION Right 09/10/2023   Procedure: RIGHT SECOND STAGE BASILIC VEIN TRANSPOSITION;  Surgeon: Young Hensen, MD;  Location: [facility] OR;  Service: Vascular;  Laterality: Right;   BUBBLE STUDY  09/22/2020   Procedure: BUBBLE STUDY;  Surgeon: Wendie Hamburg, MD;  Location: [facility] ENDOSCOPY;  Service: Cardiovascular;;   CATARACT EXTRACTION Bilateral 02/2021   LOOP RECORDER INSERTION N/A 09/22/2020   Procedure: LOOP RECORDER INSERTION;  Surgeon: Lei Pump, MD;  Location: [facility] INVASIVE CV LAB;  Service: Cardiovascular;  Laterality: N/A;   TEE WITHOUT CARDIOVERSION N/A 09/22/2020   Procedure: TRANSESOPHAGEAL ECHOCARDIOGRAM (TEE);  Surgeon: Wendie Hamburg, MD;  Location: [facility] ENDOSCOPY;  Service: Cardiovascular;  Laterality: N/A;  LOOP    Family History  Problem Relation Age of Onset   Hypertension Father    Cancer Father    Diabetes Paternal Aunt    Heart disease Neg Hx     SOCIAL HISTORY: Social History   Socioeconomic History   Marital status: Single    Spouse name: Not on file   Number of children:  2   Years of education: Not on file   Highest education level: Not on file  Occupational History   Not on file  Tobacco Use   Smoking status: Never   Smokeless tobacco: Never  Vaping Use   Vaping status: Never Used  Substance and Sexual Activity   Alcohol use: No   Drug use: Never   Sexual activity: Not Currently  Other Topics Concern   Not on file  Social History Narrative    Not on file   Social Drivers of Health   Financial Resource Strain: Low Risk  (08/16/2022)   Overall Financial Resource Strain (CARDIA)    Difficulty of Paying Living Expenses: Not hard at all  Food Insecurity: Low Risk  (01/17/2023)   Received from [facility]   Hunger Vital Sign    Worried About Running Out of Food in the Last Year: Never true    Ran Out of Food in the Last Year: Never true  Transportation Needs: No Transportation Needs (01/17/2023)   Received from [facility]   PRAPARE - Transportation    Lack of Transportation (Medical): No    Lack of Transportation (Non-Medical): No  Recent Concern: Transportation Needs - Unmet Transportation Needs (01/17/2023)   Received from [facility]   Transportation    In the past 12 months, has lack of reliable transportation kept you from medical appointments, meetings, work or from getting things needed for daily living? : Yes  Physical Activity: Insufficiently Active (08/16/2022)   Exercise Vital Sign    Days of Exercise per Week: 2 days    Minutes of Exercise per Session: 30 min  Stress: No Stress Concern Present (08/16/2022)   [employer] of Occupational Health - Occupational Stress Questionnaire    Feeling of Stress : Not at all  Social Connections: Moderately Isolated (08/16/2022)   Social Connection and Isolation Panel [NHANES]    Frequency of Communication with Friends and Family: More than three times a week    Frequency of Social Gatherings with Friends and Family: More than three times a week    Attends Religious Services: More than 4 times per year    Active Member of [employer] or Organizations: No    Attends Banker Meetings: Never    Marital Status: Divorced  Catering manager Violence: Not At Risk (08/16/2022)   Humiliation, Afraid, Rape, and Kick questionnaire    Fear of Current or Ex-Partner: No    Emotionally Abused: No    Physically Abused: No    Sexually Abused: No    No Known  Allergies  Current Outpatient Medications  Medication Sig Dispense Refill   Accu-Chek Softclix Lancets lancets Use as instructed to check BS once a day 100 each 12   amLODipine  (NORVASC ) 5 MG tablet TAKE 1 TABLET (5 MG TOTAL) BY MOUTH DAILY. PLEASE SCHEDULE AN APPOINTMENT WITH DR. Lincoln Renshaw. 30 tablet 0   aspirin  EC 81 MG tablet Take 325 mg by mouth daily. Swallow whole.     atorvastatin  (LIPITOR ) 80 MG tablet TAKE 1 TABLET (80 MG) BY MOUTH DAILY IN THE AFTERNOON. PLEASE SCHEDULE APPOINTMENT WITH DR. Lincoln Renshaw. 30 tablet 0   Blood Glucose Monitoring Suppl (ACCU-CHEK GUIDE) w/Device KIT Check BS once a day 1 kit 0   carvedilol  (COREG ) 6.25 MG tablet TAKE 1 TABLET BY MOUTH 2 TIMES DAILY WITH A MEAL. 180 tablet 0   chlorthalidone  (HYGROTON ) 25 MG tablet Take 25 mg by mouth daily.     ezetimibe  (ZETIA ) 10 MG  tablet TAKE 1 TABLET BY MOUTH EVERY DAY 90 tablet 0   famotidine  (PEPCID ) 20 MG tablet Take 20 mg by mouth 2 (two) times daily as needed for heartburn or indigestion.     ferric citrate (AURYXIA) 1 GM 210 MG(Fe) tablet Take 210 mg by mouth 3 (three) times daily with meals.     glucose blood (ACCU-CHEK GUIDE) test strip Use as instructed to check blood sugars once a day. 100 each 12   oxyCODONE -acetaminophen  (PERCOCET) 5-325 MG tablet Take 1 tablet by mouth every 6 (six) hours as needed for severe pain (pain score 7-10). 20 tablet 0   tamsulosin  (FLOMAX ) 0.4 MG CAPS capsule TAKE 1 CAPSULE BY MOUTH EVERY DAY AFTER SUPPER 90 capsule 0   No current facility-administered medications for this visit.    REVIEW OF SYSTEMS:  [X]  denotes positive finding, [ ]  denotes negative finding Cardiac  Comments:  Chest pain or chest pressure:    Shortness of breath upon exertion:    Short of breath when lying flat:    Irregular heart rhythm:        Vascular    Pain in calf, thigh, or hip brought on by ambulation:    Pain in feet at night that wakes you up from your sleep:     Blood clot in your veins:     Leg swelling:         Pulmonary    Oxygen at home:    Productive cough:     Wheezing:         Neurologic    Sudden weakness in arms or legs:     Sudden numbness in arms or legs:     Sudden onset of difficulty speaking or slurred speech:    Temporary loss of vision in one eye:     Problems with dizziness:         Gastrointestinal    Blood in stool:     Vomited blood:         Genitourinary    Burning when urinating:     Blood in urine:        Psychiatric    Major depression:         Hematologic    Bleeding problems:    Problems with blood clotting too easily:        Skin    Rashes or ulcers:        Constitutional    Fever or chills:      PHYSICAL EXAM: Vitals:   11/04/23 1430  BP: 118/63  Pulse: 61  Resp: 20  Temp: 98.1 F (36.7 C)  TempSrc: Temporal  SpO2: 100%  Weight: 130 lb (59 kg)  Height: 5\' 8"  (1.727 m)    GENERAL: The patient is a well-nourished male, in no acute distress. The vital signs are documented above. CARDIAC: There is a regular rate and rhythm.  VASCULAR:  Bilateral femoral pulses palpable No palpable pedal pulses Ulcer right lateral foot as pictured Previously healed left 5th toe amputation PULMONARY: No respiratory distress. ABDOMEN: Soft and non-tender. MUSCULOSKELETAL: There are no major deformities or cyanosis. NEUROLOGIC: No focal weakness or paresthesias are detected. PSYCHIATRIC: The patient has a normal affect.    DATA:   Right leg arterial duplex shows a moderate 50 to 74% stenosis in the SFA and a high-grade greater than 75% stenosis in the right popliteal artery with also tibial disease with a moderate PT stenosis 50-74%  ABI showed dampened monophasic flow on the  right with noncompressible ABIs  Assessment/Plan:   63 y.o. male, with history of prior CVA, ESRD, diabetes, hypertension, hyperlipidemia that presents for evaluation of nonhealing ulcer on the right lateral foot.  Patient is here with his daughter.  This  apparently occurred about 4.5 months ago in February and this has been nonhealing.    Discussed presentation concerning for diabetic foot ulcer that has been nonhealing now for 4.5 months.  He has severe dampened monophasic flow on the right with evidence of moderate SFA stenosis with a high-grade popliteal stenosis and moderate tibial disease.  I have recommended aortogram, lower extremity arteriogram with a focus on the right leg with possible SFA, popliteal, tibial angioplasty and/or stent. Risks and benefits discussed in detail.   I discussed I will also refer him to podiatry for further input.  Young Hensen, MD [facility] [place] Office: [phone]

## 2023-11-05 ENCOUNTER — Telehealth: Payer: Self-pay

## 2023-11-05 NOTE — Telephone Encounter (Signed)
 Attempted to call for surgery scheduling. LVM

## 2023-11-06 ENCOUNTER — Ambulatory Visit: Admitting: Podiatry

## 2023-11-06 ENCOUNTER — Encounter (HOSPITAL_COMMUNITY)

## 2023-11-06 DIAGNOSIS — I639 Cerebral infarction, unspecified: Secondary | ICD-10-CM | POA: Diagnosis not present

## 2023-11-06 LAB — CUP PACEART REMOTE DEVICE CHECK
Date Time Interrogation Session: 20250611231301
Implantable Pulse Generator Implant Date: 20220429

## 2023-11-07 ENCOUNTER — Telehealth: Payer: Self-pay

## 2023-11-07 NOTE — Telephone Encounter (Signed)
 Per patient's daughter, tentatively plan for 6/26 for angio with Dr. Fulton Job.  Patient cannot come in for first case.  Would like 7a or later arrival time.

## 2023-11-11 ENCOUNTER — Encounter (HOSPITAL_COMMUNITY): Payer: Self-pay

## 2023-11-11 ENCOUNTER — Ambulatory Visit: Payer: Self-pay | Admitting: Cardiology

## 2023-11-13 ENCOUNTER — Other Ambulatory Visit: Payer: Self-pay

## 2023-11-13 ENCOUNTER — Ambulatory Visit (INDEPENDENT_AMBULATORY_CARE_PROVIDER_SITE_OTHER): Admitting: Podiatry

## 2023-11-13 ENCOUNTER — Encounter: Payer: Self-pay | Admitting: Podiatry

## 2023-11-13 DIAGNOSIS — I70221 Atherosclerosis of native arteries of extremities with rest pain, right leg: Secondary | ICD-10-CM

## 2023-11-13 DIAGNOSIS — L84 Corns and callosities: Secondary | ICD-10-CM | POA: Diagnosis not present

## 2023-11-13 DIAGNOSIS — M79674 Pain in right toe(s): Secondary | ICD-10-CM | POA: Diagnosis not present

## 2023-11-13 DIAGNOSIS — B351 Tinea unguium: Secondary | ICD-10-CM | POA: Diagnosis not present

## 2023-11-13 DIAGNOSIS — I7090 Unspecified atherosclerosis: Secondary | ICD-10-CM | POA: Diagnosis not present

## 2023-11-13 DIAGNOSIS — E1149 Type 2 diabetes mellitus with other diabetic neurological complication: Secondary | ICD-10-CM

## 2023-11-13 DIAGNOSIS — E1140 Type 2 diabetes mellitus with diabetic neuropathy, unspecified: Secondary | ICD-10-CM | POA: Diagnosis not present

## 2023-11-13 DIAGNOSIS — M79675 Pain in left toe(s): Secondary | ICD-10-CM

## 2023-11-13 NOTE — Progress Notes (Signed)
 Subjective:   Patient ID: Douglas Briggs, male   DOB: 63 y.o.   MRN: [id number]   HPI Patient presents with caregiver long-term history of diabetes that was not in good control with painful lesions on the fifth metatarsal of both feet that are sore and thick yellow brittle nailbeds that he cannot take care of.  States that her diabetes is under better control now but was left with neuropathy after having had it for years along with vascular issues.  Patient does not smoke and is not active   Review of Systems  All other systems reviewed and are negative.       Objective:  Physical Exam Vitals and nursing note reviewed.  Constitutional:      Appearance: He is well-developed.  Pulmonary:     Effort: Pulmonary effort is normal.   Musculoskeletal:        General: Normal range of motion.   Skin:    General: Skin is warm.   Neurological:     Mental Status: He is alert.   Vascular status diminished both DP and PT pulses but upon questioning does not appear to have rest pain or claudication symptoms.  Found to have thick yellow brittle nailbeds 1-5 both feet and keratotic lesions base of fifth metatarsal bilateral that are preulcerative in their appearance with no drainage no erythema edema.  Good digital perfusion well-oriented.  Patient does have diminishment of sharp dull vibratory bilateral     Assessment:  Mycotic nail infection 1-5 both feet with pain and chronic lesions fifth metatarsal high risk preulcerative with long-term history of diabetic neuropathy     Plan:  H&P reviewed condition and circulatory status and at this point with no claudication or rest pain or just going to put him on a watch I gave him strict instructions on daily inspections of his feet and even if he does not feel pain but if he develops any drainage or pathology he is to reappoint immediately and I explained daily inspections of his feet.  Sharp sterile debridement of lesions base fifth metatarsal  bilateral tolerated well note no iatrogenic bleeding noted and debrided nailbeds 1-5 both feet iatrogenic bleeding reappoint routine care

## 2023-11-19 DIAGNOSIS — Y712 Prosthetic and other implants, materials and accessory cardiovascular devices associated with adverse incidents: Secondary | ICD-10-CM | POA: Diagnosis not present

## 2023-11-19 DIAGNOSIS — T82838A Hemorrhage of vascular prosthetic devices, implants and grafts, initial encounter: Secondary | ICD-10-CM | POA: Diagnosis not present

## 2023-11-19 DIAGNOSIS — Z992 Dependence on renal dialysis: Secondary | ICD-10-CM | POA: Diagnosis not present

## 2023-11-19 DIAGNOSIS — R58 Hemorrhage, not elsewhere classified: Secondary | ICD-10-CM | POA: Diagnosis not present

## 2023-11-24 DIAGNOSIS — I120 Hypertensive chronic kidney disease with stage 5 chronic kidney disease or end stage renal disease: Secondary | ICD-10-CM | POA: Diagnosis not present

## 2023-11-24 DIAGNOSIS — N186 End stage renal disease: Secondary | ICD-10-CM | POA: Diagnosis not present

## 2023-11-24 DIAGNOSIS — Z992 Dependence on renal dialysis: Secondary | ICD-10-CM | POA: Diagnosis not present

## 2023-11-27 DIAGNOSIS — Z435 Encounter for attention to cystostomy: Secondary | ICD-10-CM | POA: Diagnosis not present

## 2023-12-02 DIAGNOSIS — Z992 Dependence on renal dialysis: Secondary | ICD-10-CM | POA: Diagnosis not present

## 2023-12-02 DIAGNOSIS — N186 End stage renal disease: Secondary | ICD-10-CM | POA: Diagnosis not present

## 2023-12-04 DIAGNOSIS — Z992 Dependence on renal dialysis: Secondary | ICD-10-CM | POA: Diagnosis not present

## 2023-12-04 DIAGNOSIS — G629 Polyneuropathy, unspecified: Secondary | ICD-10-CM | POA: Diagnosis not present

## 2023-12-04 DIAGNOSIS — B353 Tinea pedis: Secondary | ICD-10-CM | POA: Diagnosis not present

## 2023-12-04 DIAGNOSIS — Z135 Encounter for screening for eye and ear disorders: Secondary | ICD-10-CM | POA: Diagnosis not present

## 2023-12-04 DIAGNOSIS — Z8673 Personal history of transient ischemic attack (TIA), and cerebral infarction without residual deficits: Secondary | ICD-10-CM | POA: Diagnosis not present

## 2023-12-04 DIAGNOSIS — Z0001 Encounter for general adult medical examination with abnormal findings: Secondary | ICD-10-CM | POA: Diagnosis not present

## 2023-12-04 DIAGNOSIS — L819 Disorder of pigmentation, unspecified: Secondary | ICD-10-CM | POA: Diagnosis not present

## 2023-12-04 DIAGNOSIS — L89892 Pressure ulcer of other site, stage 2: Secondary | ICD-10-CM | POA: Diagnosis not present

## 2023-12-04 DIAGNOSIS — N186 End stage renal disease: Secondary | ICD-10-CM | POA: Diagnosis not present

## 2023-12-04 DIAGNOSIS — I120 Hypertensive chronic kidney disease with stage 5 chronic kidney disease or end stage renal disease: Secondary | ICD-10-CM | POA: Diagnosis not present

## 2023-12-04 DIAGNOSIS — G2581 Restless legs syndrome: Secondary | ICD-10-CM | POA: Diagnosis not present

## 2023-12-04 DIAGNOSIS — I739 Peripheral vascular disease, unspecified: Secondary | ICD-10-CM | POA: Diagnosis not present

## 2023-12-04 DIAGNOSIS — I6523 Occlusion and stenosis of bilateral carotid arteries: Secondary | ICD-10-CM | POA: Diagnosis not present

## 2023-12-04 DIAGNOSIS — E119 Type 2 diabetes mellitus without complications: Secondary | ICD-10-CM | POA: Diagnosis not present

## 2023-12-05 ENCOUNTER — Other Ambulatory Visit: Payer: Self-pay | Admitting: Internal Medicine

## 2023-12-05 DIAGNOSIS — E1159 Type 2 diabetes mellitus with other circulatory complications: Secondary | ICD-10-CM

## 2023-12-05 DIAGNOSIS — I6930 Unspecified sequelae of cerebral infarction: Secondary | ICD-10-CM

## 2023-12-05 DIAGNOSIS — E113299 Type 2 diabetes mellitus with mild nonproliferative diabetic retinopathy without macular edema, unspecified eye: Secondary | ICD-10-CM

## 2023-12-08 ENCOUNTER — Encounter (HOSPITAL_COMMUNITY)

## 2023-12-08 ENCOUNTER — Ambulatory Visit: Payer: Self-pay | Admitting: Cardiology

## 2023-12-08 DIAGNOSIS — I639 Cerebral infarction, unspecified: Secondary | ICD-10-CM | POA: Diagnosis not present

## 2023-12-08 LAB — CUP PACEART REMOTE DEVICE CHECK
Date Time Interrogation Session: 20250713233104
Implantable Pulse Generator Implant Date: 20220429

## 2023-12-09 ENCOUNTER — Other Ambulatory Visit: Payer: Self-pay | Admitting: Internal Medicine

## 2023-12-09 DIAGNOSIS — E113299 Type 2 diabetes mellitus with mild nonproliferative diabetic retinopathy without macular edema, unspecified eye: Secondary | ICD-10-CM

## 2023-12-09 DIAGNOSIS — I6930 Unspecified sequelae of cerebral infarction: Secondary | ICD-10-CM

## 2023-12-18 ENCOUNTER — Ambulatory Visit (HOSPITAL_COMMUNITY)
Admission: RE | Admit: 2023-12-18 | Discharge: 2023-12-18 | Disposition: A | Discharge status: Home / Self Care | Attending: Vascular Surgery | Admitting: Vascular Surgery

## 2023-12-18 ENCOUNTER — Other Ambulatory Visit: Payer: Self-pay

## 2023-12-18 ENCOUNTER — Encounter (HOSPITAL_COMMUNITY)
Admission: RE | Disposition: A | Discharge status: Home / Self Care | Payer: Self-pay | Source: Home / Self Care | Attending: Vascular Surgery

## 2023-12-18 DIAGNOSIS — T82858A Stenosis of vascular prosthetic devices, implants and grafts, initial encounter: Secondary | ICD-10-CM

## 2023-12-18 DIAGNOSIS — Y832 Surgical operation with anastomosis, bypass or graft as the cause of abnormal reaction of the patient, or of later complication, without mention of misadventure at the time of the procedure: Secondary | ICD-10-CM | POA: Insufficient documentation

## 2023-12-18 DIAGNOSIS — Z992 Dependence on renal dialysis: Secondary | ICD-10-CM | POA: Diagnosis not present

## 2023-12-18 DIAGNOSIS — T82838A Hemorrhage of vascular prosthetic devices, implants and grafts, initial encounter: Secondary | ICD-10-CM | POA: Insufficient documentation

## 2023-12-18 DIAGNOSIS — E1122 Type 2 diabetes mellitus with diabetic chronic kidney disease: Secondary | ICD-10-CM | POA: Diagnosis not present

## 2023-12-18 DIAGNOSIS — I120 Hypertensive chronic kidney disease with stage 5 chronic kidney disease or end stage renal disease: Secondary | ICD-10-CM | POA: Diagnosis not present

## 2023-12-18 DIAGNOSIS — N186 End stage renal disease: Secondary | ICD-10-CM

## 2023-12-18 HISTORY — PX: A/V FISTULAGRAM: CATH118298

## 2023-12-18 HISTORY — PX: VENOUS ANGIOPLASTY: CATH118376

## 2023-12-18 LAB — GLUCOSE, CAPILLARY: Glucose-Capillary: 94 mg/dL (ref 70–99)

## 2023-12-18 SURGERY — A/V FISTULAGRAM
Anesthesia: LOCAL | Site: Arm Upper | Laterality: Right

## 2023-12-18 MED ORDER — LIDOCAINE HCL (PF) 1 % IJ SOLN
INTRAMUSCULAR | Status: AC
  Filled 2023-12-18: qty 30

## 2023-12-18 MED ORDER — LIDOCAINE HCL (PF) 1 % IJ SOLN
INTRAMUSCULAR | Status: DC | PRN
  Administered 2023-12-18: 2 mL via SUBCUTANEOUS

## 2023-12-18 MED ORDER — HEPARIN (PORCINE) IN NACL 1000-0.9 UT/500ML-% IV SOLN
INTRAVENOUS | Status: DC | PRN
  Administered 2023-12-18: 500 mL

## 2023-12-18 MED ORDER — IODIXANOL 320 MG/ML IV SOLN
INTRAVENOUS | Status: DC | PRN
  Administered 2023-12-18: 20 mL via INTRAVENOUS

## 2023-12-18 SURGICAL SUPPLY — 9 items
BALLOON MUSTANG 8X60X75 (BALLOONS) IMPLANT
KIT ENCORE 26 ADVANTAGE (KITS) IMPLANT
KIT MICROPUNCTURE NIT STIFF (SHEATH) IMPLANT
SHEATH PINNACLE R/O II 6F 4CM (SHEATH) IMPLANT
SHEATH PROBE COVER 6X72 (BAG) IMPLANT
STOPCOCK MORSE 400PSI 3WAY (MISCELLANEOUS) IMPLANT
TRAY PV CATH (CUSTOM PROCEDURE TRAY) ×2 IMPLANT
TUBING CIL FLEX 10 FLL-RA (TUBING) IMPLANT
WIRE BENTSON .035X145CM (WIRE) IMPLANT

## 2023-12-18 NOTE — Op Note (Signed)
    Patient name: Douglas Briggs MRN: [id number] DOB: 05-26-1961 Sex: male  12/18/2023 Pre-operative Diagnosis: ESRD on HD Post-operative diagnosis:  Same Surgeon:  Norman GORMAN Serve, MD Procedure Performed:  Ultrasound-guided access of left arm AV fistula Fistulogram and central venogram Balloon angioplasty of mid fistula stenosis, 8 mm x 60 mm Mustang balloon  Indications: Mr. Coury is a 63 year old male with ESRD on HD who presents to the HD access center today due to issues with prolonged bleeding after decannulation.  His last HD session was yesterday.  Risks and benefits of fistulogram with intervention were reviewed and he elected to proceed.  Findings:  Widely patent central venous system and axillary vein.  There is a mid fistula stenosis, approximately 70%.  The proximal fistula and anastomosis are widely patent.   Procedure:  The patient was identified in the holding area and taken to the cath lab  The patient was then placed supine on the table and prepped and draped in the usual sterile fashion.  A time out was called.  Ultrasound was used to evaluate the left arm AV access. This was accessed under u/s guidance. An 018 wire was advanced without resistance, a micropuncture sheath was placed and fistulagram obtained which demonstrated the above findings.  This access was then upsized to a 6 F short sheath over a glidewire.  The lesion was crossed and treated with an 8 mm x 6 mm Mustang balloon with an adequate results of less than 30% residual stenosis.  The wire and sheath were then removed and the access was managed with a 4-0 Monocryl figure-of-eight suture for hemostasis.  Contrast: 20 cc   Impression: Balloon angioplasty of mid fistula stenosis, 70%.  Treated with 8 mm x 6 mm Mustang balloon, residual stenosis of less than 30%.  Norman GORMAN Serve MD [facility] Office: [phone]

## 2023-12-18 NOTE — H&P (Signed)
 HD ACCESS CENTER H&P   Patient ID: Douglas Briggs, male   DOB: 1961-05-19, 63 y.o.   MRN: [id number]  Subjective:     HPI Douglas Briggs is a 63 y.o. male with ESRD presenting to the HD access center for intervention.  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 Dr. Jerelene Co - seen 11/24/19   Peripheral vascular disease (HCC)    Protein-calorie malnutrition, mild (HCC) 09/11/2020   Restless leg syndrome 11/14/2016   Retinopathy    Right sided numbness    Stroke (HCC)    Stroke-like symptoms 10/29/2019   TIA (transient ischemic attack) 10/29/2019   Tinea pedis of both feet 03/12/2017   Ulcer of left lower extremity with fat layer exposed (HCC) 01/20/2020   Uncontrolled type 2 diabetes mellitus with diabetic polyneuropathy, without long-term current use of insulin  11/12/2016   noe meds and patient does not check blood sugar as of 09/08/23   Family History  Problem Relation Age of Onset   Hypertension Father    Cancer Father    Diabetes Paternal Aunt    Heart disease Neg Hx    Past Surgical History:  Procedure Laterality Date   AMPUTATION Left 12/20/2019   Procedure: AMPUTATION 5TH TOE;  Surgeon: Elsa Lonni SAUNDERS, MD;  Location: [facility] OR;   Service: Orthopedics;  Laterality: Left;   BASCILIC VEIN TRANSPOSITION Right 12/23/2022   Procedure: RIGHT FIRST STAGE BRACHIOBASILIC VEIN TRANSPOSITION;  Surgeon: Gretta Lonni PARAS, MD;  Location: [facility] OR;  Service: Vascular;  Laterality: Right;   BASCILIC VEIN TRANSPOSITION Right 09/10/2023   Procedure: RIGHT SECOND STAGE BASILIC VEIN TRANSPOSITION;  Surgeon: Gretta Lonni PARAS, MD;  Location: [facility] OR;  Service: Vascular;  Laterality: Right;   BUBBLE STUDY  09/22/2020   Procedure: BUBBLE STUDY;  Surgeon: Kate Lonni CROME, MD;  Location: [facility] ENDOSCOPY;  Service: Cardiovascular;;   CATARACT EXTRACTION Bilateral 02/2021   LOOP RECORDER INSERTION N/A 09/22/2020   Procedure: LOOP RECORDER INSERTION;  Surgeon: Inocencio Soyla Lunger, MD;  Location: [facility] INVASIVE CV LAB;  Service: Cardiovascular;  Laterality: N/A;   TEE WITHOUT CARDIOVERSION N/A 09/22/2020   Procedure: TRANSESOPHAGEAL ECHOCARDIOGRAM (TEE);  Surgeon: Kate Lonni CROME, MD;  Location: [facility] ENDOSCOPY;  Service: Cardiovascular;  Laterality: N/A;  LOOP    Short Social History:  Social History   Tobacco Use   Smoking status: Never   Smokeless tobacco: Never  Substance Use Topics   Alcohol use: No    No Known Allergies  No current facility-administered medications for this encounter.    REVIEW OF SYSTEMS All other systems were reviewed and are negative     Objective:   Objective   There were no vitals filed for this visit. There is no height or weight on file to calculate  BMI.  Physical Exam General: no acute distress Cardiac: hemodynamically stable Extremities: Palpable thrill and right arm AV fistula      Assessment/Plan:   Douglas Briggs is a 63 y.o. male with ESRD presenting for fistulogram.  Having issues with prolonged bleeding. Last HD session yesterday. Reviewed risks and benefits of fistulogram with intervention and patient agreed to proceed.   Norman Serve, MD [facility]

## 2023-12-19 ENCOUNTER — Encounter (HOSPITAL_COMMUNITY): Payer: Self-pay | Admitting: Vascular Surgery

## 2023-12-19 MED FILL — Heparin Sodium (Porcine) Inj 1000 Unit/ML: INTRAMUSCULAR | Qty: 10 | Status: AC

## 2023-12-25 ENCOUNTER — Encounter (HOSPITAL_COMMUNITY): Admission: RE | Payer: Self-pay | Source: Home / Self Care

## 2023-12-25 ENCOUNTER — Ambulatory Visit (HOSPITAL_COMMUNITY): Admission: RE | Admit: 2023-12-25 | Source: Home / Self Care | Admitting: Vascular Surgery

## 2023-12-25 DIAGNOSIS — I120 Hypertensive chronic kidney disease with stage 5 chronic kidney disease or end stage renal disease: Secondary | ICD-10-CM | POA: Diagnosis not present

## 2023-12-25 DIAGNOSIS — N186 End stage renal disease: Secondary | ICD-10-CM | POA: Diagnosis not present

## 2023-12-25 DIAGNOSIS — Z992 Dependence on renal dialysis: Secondary | ICD-10-CM | POA: Diagnosis not present

## 2023-12-25 SURGERY — ABDOMINAL AORTOGRAM
Anesthesia: LOCAL

## 2023-12-26 ENCOUNTER — Other Ambulatory Visit: Payer: Self-pay

## 2023-12-26 DIAGNOSIS — I70221 Atherosclerosis of native arteries of extremities with rest pain, right leg: Secondary | ICD-10-CM

## 2023-12-30 DIAGNOSIS — I739 Peripheral vascular disease, unspecified: Secondary | ICD-10-CM | POA: Diagnosis not present

## 2023-12-30 DIAGNOSIS — E119 Type 2 diabetes mellitus without complications: Secondary | ICD-10-CM | POA: Diagnosis not present

## 2023-12-30 DIAGNOSIS — S91104D Unspecified open wound of right lesser toe(s) without damage to nail, subsequent encounter: Secondary | ICD-10-CM | POA: Diagnosis not present

## 2023-12-30 DIAGNOSIS — I10 Essential (primary) hypertension: Secondary | ICD-10-CM | POA: Diagnosis not present

## 2023-12-30 DIAGNOSIS — B353 Tinea pedis: Secondary | ICD-10-CM | POA: Diagnosis not present

## 2023-12-30 NOTE — Progress Notes (Signed)
 Care[facility] Summary Report / Loop Recorder

## 2024-01-05 DIAGNOSIS — I2510 Atherosclerotic heart disease of native coronary artery without angina pectoris: Secondary | ICD-10-CM | POA: Diagnosis not present

## 2024-01-05 DIAGNOSIS — M89549 Osteolysis, unspecified hand: Secondary | ICD-10-CM | POA: Diagnosis not present

## 2024-01-05 DIAGNOSIS — L089 Local infection of the skin and subcutaneous tissue, unspecified: Secondary | ICD-10-CM | POA: Diagnosis not present

## 2024-01-05 DIAGNOSIS — D649 Anemia, unspecified: Secondary | ICD-10-CM | POA: Diagnosis not present

## 2024-01-05 DIAGNOSIS — N186 End stage renal disease: Secondary | ICD-10-CM | POA: Diagnosis not present

## 2024-01-05 DIAGNOSIS — E1140 Type 2 diabetes mellitus with diabetic neuropathy, unspecified: Secondary | ICD-10-CM | POA: Diagnosis not present

## 2024-01-05 DIAGNOSIS — M86171 Other acute osteomyelitis, right ankle and foot: Secondary | ICD-10-CM | POA: Diagnosis not present

## 2024-01-05 DIAGNOSIS — M2141 Flat foot [pes planus] (acquired), right foot: Secondary | ICD-10-CM | POA: Diagnosis not present

## 2024-01-05 DIAGNOSIS — R0689 Other abnormalities of breathing: Secondary | ICD-10-CM | POA: Diagnosis not present

## 2024-01-05 DIAGNOSIS — G629 Polyneuropathy, unspecified: Secondary | ICD-10-CM | POA: Diagnosis not present

## 2024-01-05 DIAGNOSIS — M868X7 Other osteomyelitis, ankle and foot: Secondary | ICD-10-CM | POA: Diagnosis not present

## 2024-01-05 DIAGNOSIS — M8618 Other acute osteomyelitis, other site: Secondary | ICD-10-CM | POA: Diagnosis not present

## 2024-01-05 DIAGNOSIS — Z9889 Other specified postprocedural states: Secondary | ICD-10-CM | POA: Diagnosis not present

## 2024-01-05 DIAGNOSIS — M869 Osteomyelitis, unspecified: Secondary | ICD-10-CM | POA: Diagnosis not present

## 2024-01-05 DIAGNOSIS — Z029 Encounter for administrative examinations, unspecified: Secondary | ICD-10-CM | POA: Diagnosis not present

## 2024-01-05 DIAGNOSIS — E1152 Type 2 diabetes mellitus with diabetic peripheral angiopathy with gangrene: Secondary | ICD-10-CM | POA: Diagnosis not present

## 2024-01-05 DIAGNOSIS — I739 Peripheral vascular disease, unspecified: Secondary | ICD-10-CM | POA: Diagnosis not present

## 2024-01-05 DIAGNOSIS — Z992 Dependence on renal dialysis: Secondary | ICD-10-CM | POA: Diagnosis not present

## 2024-01-05 DIAGNOSIS — I10 Essential (primary) hypertension: Secondary | ICD-10-CM | POA: Diagnosis not present

## 2024-01-05 DIAGNOSIS — E1169 Type 2 diabetes mellitus with other specified complication: Secondary | ICD-10-CM | POA: Diagnosis not present

## 2024-01-05 DIAGNOSIS — I120 Hypertensive chronic kidney disease with stage 5 chronic kidney disease or end stage renal disease: Secondary | ICD-10-CM | POA: Diagnosis not present

## 2024-01-05 DIAGNOSIS — M7989 Other specified soft tissue disorders: Secondary | ICD-10-CM | POA: Diagnosis not present

## 2024-01-05 DIAGNOSIS — I70261 Atherosclerosis of native arteries of extremities with gangrene, right leg: Secondary | ICD-10-CM | POA: Diagnosis not present

## 2024-01-05 DIAGNOSIS — E11621 Type 2 diabetes mellitus with foot ulcer: Secondary | ICD-10-CM | POA: Diagnosis not present

## 2024-01-05 DIAGNOSIS — R2689 Other abnormalities of gait and mobility: Secondary | ICD-10-CM | POA: Diagnosis not present

## 2024-01-05 DIAGNOSIS — I700 Atherosclerosis of aorta: Secondary | ICD-10-CM | POA: Diagnosis not present

## 2024-01-05 DIAGNOSIS — L97518 Non-pressure chronic ulcer of other part of right foot with other specified severity: Secondary | ICD-10-CM | POA: Diagnosis not present

## 2024-01-05 DIAGNOSIS — M19071 Primary osteoarthritis, right ankle and foot: Secondary | ICD-10-CM | POA: Diagnosis not present

## 2024-01-05 DIAGNOSIS — D6859 Other primary thrombophilia: Secondary | ICD-10-CM | POA: Diagnosis not present

## 2024-01-05 DIAGNOSIS — I70239 Atherosclerosis of native arteries of right leg with ulceration of unspecified site: Secondary | ICD-10-CM | POA: Diagnosis not present

## 2024-01-05 DIAGNOSIS — R339 Retention of urine, unspecified: Secondary | ICD-10-CM | POA: Diagnosis not present

## 2024-01-05 DIAGNOSIS — R600 Localized edema: Secondary | ICD-10-CM | POA: Diagnosis not present

## 2024-01-05 DIAGNOSIS — I96 Gangrene, not elsewhere classified: Secondary | ICD-10-CM | POA: Diagnosis not present

## 2024-01-05 DIAGNOSIS — L97519 Non-pressure chronic ulcer of other part of right foot with unspecified severity: Secondary | ICD-10-CM | POA: Diagnosis not present

## 2024-01-05 DIAGNOSIS — R069 Unspecified abnormalities of breathing: Secondary | ICD-10-CM | POA: Diagnosis not present

## 2024-01-05 DIAGNOSIS — E871 Hypo-osmolality and hyponatremia: Secondary | ICD-10-CM | POA: Diagnosis not present

## 2024-01-05 DIAGNOSIS — L89614 Pressure ulcer of right heel, stage 4: Secondary | ICD-10-CM | POA: Diagnosis not present

## 2024-01-05 DIAGNOSIS — E440 Moderate protein-calorie malnutrition: Secondary | ICD-10-CM | POA: Diagnosis not present

## 2024-01-05 DIAGNOSIS — L0889 Other specified local infections of the skin and subcutaneous tissue: Secondary | ICD-10-CM | POA: Diagnosis not present

## 2024-01-05 DIAGNOSIS — I708 Atherosclerosis of other arteries: Secondary | ICD-10-CM | POA: Diagnosis not present

## 2024-01-05 DIAGNOSIS — M86671 Other chronic osteomyelitis, right ankle and foot: Secondary | ICD-10-CM | POA: Diagnosis not present

## 2024-01-05 DIAGNOSIS — Z89421 Acquired absence of other right toe(s): Secondary | ICD-10-CM | POA: Diagnosis not present

## 2024-01-05 DIAGNOSIS — M2041 Other hammer toe(s) (acquired), right foot: Secondary | ICD-10-CM | POA: Diagnosis not present

## 2024-01-05 DIAGNOSIS — J9811 Atelectasis: Secondary | ICD-10-CM | POA: Diagnosis not present

## 2024-01-05 DIAGNOSIS — G2581 Restless legs syndrome: Secondary | ICD-10-CM | POA: Diagnosis not present

## 2024-01-05 DIAGNOSIS — D631 Anemia in chronic kidney disease: Secondary | ICD-10-CM | POA: Diagnosis not present

## 2024-01-05 DIAGNOSIS — L97919 Non-pressure chronic ulcer of unspecified part of right lower leg with unspecified severity: Secondary | ICD-10-CM | POA: Diagnosis not present

## 2024-01-05 DIAGNOSIS — Z8673 Personal history of transient ischemic attack (TIA), and cerebral infarction without residual deficits: Secondary | ICD-10-CM | POA: Diagnosis not present

## 2024-01-05 DIAGNOSIS — B952 Enterococcus as the cause of diseases classified elsewhere: Secondary | ICD-10-CM | POA: Diagnosis not present

## 2024-01-05 DIAGNOSIS — Z681 Body mass index (BMI) 19 or less, adult: Secondary | ICD-10-CM | POA: Diagnosis not present

## 2024-01-05 DIAGNOSIS — Z9359 Other cystostomy status: Secondary | ICD-10-CM | POA: Diagnosis not present

## 2024-01-05 DIAGNOSIS — E119 Type 2 diabetes mellitus without complications: Secondary | ICD-10-CM | POA: Diagnosis not present

## 2024-01-05 DIAGNOSIS — E785 Hyperlipidemia, unspecified: Secondary | ICD-10-CM | POA: Diagnosis not present

## 2024-01-05 DIAGNOSIS — B957 Other staphylococcus as the cause of diseases classified elsewhere: Secondary | ICD-10-CM | POA: Diagnosis not present

## 2024-01-05 DIAGNOSIS — E1122 Type 2 diabetes mellitus with diabetic chronic kidney disease: Secondary | ICD-10-CM | POA: Diagnosis not present

## 2024-01-05 DIAGNOSIS — I70221 Atherosclerosis of native arteries of extremities with rest pain, right leg: Secondary | ICD-10-CM | POA: Diagnosis not present

## 2024-01-05 DIAGNOSIS — I639 Cerebral infarction, unspecified: Secondary | ICD-10-CM | POA: Diagnosis not present

## 2024-01-08 ENCOUNTER — Encounter (HOSPITAL_COMMUNITY)

## 2024-01-08 DIAGNOSIS — I639 Cerebral infarction, unspecified: Secondary | ICD-10-CM | POA: Diagnosis not present

## 2024-01-08 LAB — CUP PACEART REMOTE DEVICE CHECK
Date Time Interrogation Session: 20250813231527
Implantable Pulse Generator Implant Date: 20220429

## 2024-01-10 ENCOUNTER — Ambulatory Visit: Payer: Self-pay | Admitting: Cardiology

## 2024-01-15 ENCOUNTER — Telehealth: Payer: Self-pay

## 2024-01-15 ENCOUNTER — Encounter (HOSPITAL_COMMUNITY): Admission: RE | Payer: Self-pay | Source: Home / Self Care

## 2024-01-15 ENCOUNTER — Ambulatory Visit (HOSPITAL_COMMUNITY): Admission: RE | Admit: 2024-01-15 | Source: Home / Self Care | Admitting: Vascular Surgery

## 2024-01-15 SURGERY — ABDOMINAL AORTOGRAM
Anesthesia: LOCAL

## 2024-01-15 NOTE — Telephone Encounter (Signed)
 Was informed that patient had his angiogram performed at [facility].  Per Dr. Gretta, patient will need an office visit with him prior to scheduling anything else.  Paperwork for procedure shredded.

## 2024-01-19 DIAGNOSIS — M869 Osteomyelitis, unspecified: Secondary | ICD-10-CM | POA: Diagnosis not present

## 2024-01-19 DIAGNOSIS — R2689 Other abnormalities of gait and mobility: Secondary | ICD-10-CM | POA: Diagnosis not present

## 2024-01-19 DIAGNOSIS — N186 End stage renal disease: Secondary | ICD-10-CM | POA: Diagnosis not present

## 2024-01-19 DIAGNOSIS — I120 Hypertensive chronic kidney disease with stage 5 chronic kidney disease or end stage renal disease: Secondary | ICD-10-CM | POA: Diagnosis not present

## 2024-01-19 DIAGNOSIS — Z9359 Other cystostomy status: Secondary | ICD-10-CM | POA: Diagnosis not present

## 2024-01-19 DIAGNOSIS — Z89421 Acquired absence of other right toe(s): Secondary | ICD-10-CM | POA: Diagnosis not present

## 2024-01-19 DIAGNOSIS — Z992 Dependence on renal dialysis: Secondary | ICD-10-CM | POA: Diagnosis not present

## 2024-01-19 DIAGNOSIS — I96 Gangrene, not elsewhere classified: Secondary | ICD-10-CM | POA: Diagnosis not present

## 2024-01-19 DIAGNOSIS — Z8673 Personal history of transient ischemic attack (TIA), and cerebral infarction without residual deficits: Secondary | ICD-10-CM | POA: Diagnosis not present

## 2024-01-19 DIAGNOSIS — Z029 Encounter for administrative examinations, unspecified: Secondary | ICD-10-CM | POA: Diagnosis not present

## 2024-01-19 DIAGNOSIS — E119 Type 2 diabetes mellitus without complications: Secondary | ICD-10-CM | POA: Diagnosis not present

## 2024-01-24 DIAGNOSIS — Z992 Dependence on renal dialysis: Secondary | ICD-10-CM | POA: Diagnosis not present

## 2024-01-24 DIAGNOSIS — Z4781 Encounter for orthopedic aftercare following surgical amputation: Secondary | ICD-10-CM | POA: Diagnosis not present

## 2024-01-24 DIAGNOSIS — M86171 Other acute osteomyelitis, right ankle and foot: Secondary | ICD-10-CM | POA: Diagnosis not present

## 2024-01-24 DIAGNOSIS — Z8673 Personal history of transient ischemic attack (TIA), and cerebral infarction without residual deficits: Secondary | ICD-10-CM | POA: Diagnosis not present

## 2024-01-24 DIAGNOSIS — Z7902 Long term (current) use of antithrombotics/antiplatelets: Secondary | ICD-10-CM | POA: Diagnosis not present

## 2024-01-24 DIAGNOSIS — E1169 Type 2 diabetes mellitus with other specified complication: Secondary | ICD-10-CM | POA: Diagnosis not present

## 2024-01-24 DIAGNOSIS — E1151 Type 2 diabetes mellitus with diabetic peripheral angiopathy without gangrene: Secondary | ICD-10-CM | POA: Diagnosis not present

## 2024-01-24 DIAGNOSIS — I120 Hypertensive chronic kidney disease with stage 5 chronic kidney disease or end stage renal disease: Secondary | ICD-10-CM | POA: Diagnosis not present

## 2024-01-24 DIAGNOSIS — G2581 Restless legs syndrome: Secondary | ICD-10-CM | POA: Diagnosis not present

## 2024-01-24 DIAGNOSIS — B957 Other staphylococcus as the cause of diseases classified elsewhere: Secondary | ICD-10-CM | POA: Diagnosis not present

## 2024-01-24 DIAGNOSIS — N186 End stage renal disease: Secondary | ICD-10-CM | POA: Diagnosis not present

## 2024-01-24 DIAGNOSIS — E785 Hyperlipidemia, unspecified: Secondary | ICD-10-CM | POA: Diagnosis not present

## 2024-01-24 DIAGNOSIS — D631 Anemia in chronic kidney disease: Secondary | ICD-10-CM | POA: Diagnosis not present

## 2024-01-24 DIAGNOSIS — N139 Obstructive and reflux uropathy, unspecified: Secondary | ICD-10-CM | POA: Diagnosis not present

## 2024-01-24 DIAGNOSIS — R339 Retention of urine, unspecified: Secondary | ICD-10-CM | POA: Diagnosis not present

## 2024-01-24 DIAGNOSIS — E1122 Type 2 diabetes mellitus with diabetic chronic kidney disease: Secondary | ICD-10-CM | POA: Diagnosis not present

## 2024-01-24 DIAGNOSIS — Z89421 Acquired absence of other right toe(s): Secondary | ICD-10-CM | POA: Diagnosis not present

## 2024-01-24 DIAGNOSIS — E1142 Type 2 diabetes mellitus with diabetic polyneuropathy: Secondary | ICD-10-CM | POA: Diagnosis not present

## 2024-01-24 DIAGNOSIS — Z556 Problems related to health literacy: Secondary | ICD-10-CM | POA: Diagnosis not present

## 2024-01-24 DIAGNOSIS — Z435 Encounter for attention to cystostomy: Secondary | ICD-10-CM | POA: Diagnosis not present

## 2024-01-24 DIAGNOSIS — Z604 Social exclusion and rejection: Secondary | ICD-10-CM | POA: Diagnosis not present

## 2024-01-24 DIAGNOSIS — Z7982 Long term (current) use of aspirin: Secondary | ICD-10-CM | POA: Diagnosis not present

## 2024-01-24 DIAGNOSIS — D509 Iron deficiency anemia, unspecified: Secondary | ICD-10-CM | POA: Diagnosis not present

## 2024-01-25 DIAGNOSIS — N186 End stage renal disease: Secondary | ICD-10-CM | POA: Diagnosis not present

## 2024-01-25 DIAGNOSIS — I120 Hypertensive chronic kidney disease with stage 5 chronic kidney disease or end stage renal disease: Secondary | ICD-10-CM | POA: Diagnosis not present

## 2024-01-25 DIAGNOSIS — Z992 Dependence on renal dialysis: Secondary | ICD-10-CM | POA: Diagnosis not present

## 2024-01-27 DIAGNOSIS — Z89421 Acquired absence of other right toe(s): Secondary | ICD-10-CM | POA: Diagnosis not present

## 2024-02-03 DIAGNOSIS — Z7409 Other reduced mobility: Secondary | ICD-10-CM | POA: Diagnosis not present

## 2024-02-03 DIAGNOSIS — D509 Iron deficiency anemia, unspecified: Secondary | ICD-10-CM | POA: Diagnosis not present

## 2024-02-03 DIAGNOSIS — Z89421 Acquired absence of other right toe(s): Secondary | ICD-10-CM | POA: Diagnosis not present

## 2024-02-03 DIAGNOSIS — I739 Peripheral vascular disease, unspecified: Secondary | ICD-10-CM | POA: Diagnosis not present

## 2024-02-03 DIAGNOSIS — Z789 Other specified health status: Secondary | ICD-10-CM | POA: Diagnosis not present

## 2024-02-03 DIAGNOSIS — R159 Full incontinence of feces: Secondary | ICD-10-CM | POA: Diagnosis not present

## 2024-02-03 DIAGNOSIS — G629 Polyneuropathy, unspecified: Secondary | ICD-10-CM | POA: Diagnosis not present

## 2024-02-03 DIAGNOSIS — Z09 Encounter for follow-up examination after completed treatment for conditions other than malignant neoplasm: Secondary | ICD-10-CM | POA: Diagnosis not present

## 2024-02-05 DIAGNOSIS — E1122 Type 2 diabetes mellitus with diabetic chronic kidney disease: Secondary | ICD-10-CM | POA: Diagnosis not present

## 2024-02-08 ENCOUNTER — Other Ambulatory Visit: Payer: Self-pay | Admitting: Internal Medicine

## 2024-02-08 DIAGNOSIS — R339 Retention of urine, unspecified: Secondary | ICD-10-CM

## 2024-02-09 ENCOUNTER — Encounter (HOSPITAL_COMMUNITY)

## 2024-02-09 DIAGNOSIS — I639 Cerebral infarction, unspecified: Secondary | ICD-10-CM | POA: Diagnosis not present

## 2024-02-10 ENCOUNTER — Ambulatory Visit: Payer: Self-pay | Admitting: Cardiology

## 2024-02-10 DIAGNOSIS — E1142 Type 2 diabetes mellitus with diabetic polyneuropathy: Secondary | ICD-10-CM | POA: Diagnosis not present

## 2024-02-10 DIAGNOSIS — E1169 Type 2 diabetes mellitus with other specified complication: Secondary | ICD-10-CM | POA: Diagnosis not present

## 2024-02-10 DIAGNOSIS — I120 Hypertensive chronic kidney disease with stage 5 chronic kidney disease or end stage renal disease: Secondary | ICD-10-CM | POA: Diagnosis not present

## 2024-02-10 DIAGNOSIS — N139 Obstructive and reflux uropathy, unspecified: Secondary | ICD-10-CM | POA: Diagnosis not present

## 2024-02-10 DIAGNOSIS — E1122 Type 2 diabetes mellitus with diabetic chronic kidney disease: Secondary | ICD-10-CM | POA: Diagnosis not present

## 2024-02-10 DIAGNOSIS — N186 End stage renal disease: Secondary | ICD-10-CM | POA: Diagnosis not present

## 2024-02-10 DIAGNOSIS — D509 Iron deficiency anemia, unspecified: Secondary | ICD-10-CM | POA: Diagnosis not present

## 2024-02-10 DIAGNOSIS — Z7982 Long term (current) use of aspirin: Secondary | ICD-10-CM | POA: Diagnosis not present

## 2024-02-10 DIAGNOSIS — Z604 Social exclusion and rejection: Secondary | ICD-10-CM | POA: Diagnosis not present

## 2024-02-10 DIAGNOSIS — M86171 Other acute osteomyelitis, right ankle and foot: Secondary | ICD-10-CM | POA: Diagnosis not present

## 2024-02-10 DIAGNOSIS — Z8673 Personal history of transient ischemic attack (TIA), and cerebral infarction without residual deficits: Secondary | ICD-10-CM | POA: Diagnosis not present

## 2024-02-10 DIAGNOSIS — B957 Other staphylococcus as the cause of diseases classified elsewhere: Secondary | ICD-10-CM | POA: Diagnosis not present

## 2024-02-10 DIAGNOSIS — G2581 Restless legs syndrome: Secondary | ICD-10-CM | POA: Diagnosis not present

## 2024-02-10 DIAGNOSIS — Z7902 Long term (current) use of antithrombotics/antiplatelets: Secondary | ICD-10-CM | POA: Diagnosis not present

## 2024-02-10 DIAGNOSIS — R339 Retention of urine, unspecified: Secondary | ICD-10-CM | POA: Diagnosis not present

## 2024-02-10 DIAGNOSIS — Z435 Encounter for attention to cystostomy: Secondary | ICD-10-CM | POA: Diagnosis not present

## 2024-02-10 DIAGNOSIS — Z89421 Acquired absence of other right toe(s): Secondary | ICD-10-CM | POA: Diagnosis not present

## 2024-02-10 DIAGNOSIS — Z556 Problems related to health literacy: Secondary | ICD-10-CM | POA: Diagnosis not present

## 2024-02-10 DIAGNOSIS — E1151 Type 2 diabetes mellitus with diabetic peripheral angiopathy without gangrene: Secondary | ICD-10-CM | POA: Diagnosis not present

## 2024-02-10 LAB — CUP PACEART REMOTE DEVICE CHECK
Date Time Interrogation Session: 20250914231951
Implantable Pulse Generator Implant Date: 20220429

## 2024-02-14 NOTE — Progress Notes (Signed)
 Remote Loop Recorder Transmission

## 2024-02-17 DIAGNOSIS — D509 Iron deficiency anemia, unspecified: Secondary | ICD-10-CM | POA: Diagnosis not present

## 2024-02-17 DIAGNOSIS — G2581 Restless legs syndrome: Secondary | ICD-10-CM | POA: Diagnosis not present

## 2024-02-17 DIAGNOSIS — E1142 Type 2 diabetes mellitus with diabetic polyneuropathy: Secondary | ICD-10-CM | POA: Diagnosis not present

## 2024-02-17 DIAGNOSIS — E785 Hyperlipidemia, unspecified: Secondary | ICD-10-CM | POA: Diagnosis not present

## 2024-02-17 DIAGNOSIS — D631 Anemia in chronic kidney disease: Secondary | ICD-10-CM | POA: Diagnosis not present

## 2024-02-17 DIAGNOSIS — R339 Retention of urine, unspecified: Secondary | ICD-10-CM | POA: Diagnosis not present

## 2024-02-17 DIAGNOSIS — Z604 Social exclusion and rejection: Secondary | ICD-10-CM | POA: Diagnosis not present

## 2024-02-17 DIAGNOSIS — B957 Other staphylococcus as the cause of diseases classified elsewhere: Secondary | ICD-10-CM | POA: Diagnosis not present

## 2024-02-17 DIAGNOSIS — E1122 Type 2 diabetes mellitus with diabetic chronic kidney disease: Secondary | ICD-10-CM | POA: Diagnosis not present

## 2024-02-17 DIAGNOSIS — Z89421 Acquired absence of other right toe(s): Secondary | ICD-10-CM | POA: Diagnosis not present

## 2024-02-17 DIAGNOSIS — Z556 Problems related to health literacy: Secondary | ICD-10-CM | POA: Diagnosis not present

## 2024-02-17 DIAGNOSIS — N186 End stage renal disease: Secondary | ICD-10-CM | POA: Diagnosis not present

## 2024-02-17 DIAGNOSIS — E1151 Type 2 diabetes mellitus with diabetic peripheral angiopathy without gangrene: Secondary | ICD-10-CM | POA: Diagnosis not present

## 2024-02-17 DIAGNOSIS — I120 Hypertensive chronic kidney disease with stage 5 chronic kidney disease or end stage renal disease: Secondary | ICD-10-CM | POA: Diagnosis not present

## 2024-02-17 DIAGNOSIS — Z7902 Long term (current) use of antithrombotics/antiplatelets: Secondary | ICD-10-CM | POA: Diagnosis not present

## 2024-02-17 DIAGNOSIS — E1169 Type 2 diabetes mellitus with other specified complication: Secondary | ICD-10-CM | POA: Diagnosis not present

## 2024-02-17 DIAGNOSIS — Z4781 Encounter for orthopedic aftercare following surgical amputation: Secondary | ICD-10-CM | POA: Diagnosis not present

## 2024-02-17 DIAGNOSIS — N139 Obstructive and reflux uropathy, unspecified: Secondary | ICD-10-CM | POA: Diagnosis not present

## 2024-02-17 DIAGNOSIS — Z992 Dependence on renal dialysis: Secondary | ICD-10-CM | POA: Diagnosis not present

## 2024-02-17 DIAGNOSIS — Z435 Encounter for attention to cystostomy: Secondary | ICD-10-CM | POA: Diagnosis not present

## 2024-02-17 DIAGNOSIS — Z8673 Personal history of transient ischemic attack (TIA), and cerebral infarction without residual deficits: Secondary | ICD-10-CM | POA: Diagnosis not present

## 2024-02-17 DIAGNOSIS — M86171 Other acute osteomyelitis, right ankle and foot: Secondary | ICD-10-CM | POA: Diagnosis not present

## 2024-02-18 NOTE — Progress Notes (Signed)
 Remote Loop Recorder Transmission

## 2024-02-19 DIAGNOSIS — Z959 Presence of cardiac and vascular implant and graft, unspecified: Secondary | ICD-10-CM | POA: Diagnosis not present

## 2024-02-19 DIAGNOSIS — I70221 Atherosclerosis of native arteries of extremities with rest pain, right leg: Secondary | ICD-10-CM | POA: Diagnosis not present

## 2024-02-24 DIAGNOSIS — Z466 Encounter for fitting and adjustment of urinary device: Secondary | ICD-10-CM | POA: Diagnosis not present

## 2024-02-24 DIAGNOSIS — N401 Enlarged prostate with lower urinary tract symptoms: Secondary | ICD-10-CM | POA: Diagnosis not present

## 2024-02-24 DIAGNOSIS — N138 Other obstructive and reflux uropathy: Secondary | ICD-10-CM | POA: Diagnosis not present

## 2024-02-24 DIAGNOSIS — I120 Hypertensive chronic kidney disease with stage 5 chronic kidney disease or end stage renal disease: Secondary | ICD-10-CM | POA: Diagnosis not present

## 2024-02-24 DIAGNOSIS — N319 Neuromuscular dysfunction of bladder, unspecified: Secondary | ICD-10-CM | POA: Diagnosis not present

## 2024-02-24 DIAGNOSIS — Z992 Dependence on renal dialysis: Secondary | ICD-10-CM | POA: Diagnosis not present

## 2024-02-24 DIAGNOSIS — Z125 Encounter for screening for malignant neoplasm of prostate: Secondary | ICD-10-CM | POA: Diagnosis not present

## 2024-02-24 DIAGNOSIS — N186 End stage renal disease: Secondary | ICD-10-CM | POA: Diagnosis not present

## 2024-02-26 DIAGNOSIS — Z7902 Long term (current) use of antithrombotics/antiplatelets: Secondary | ICD-10-CM | POA: Diagnosis not present

## 2024-02-26 DIAGNOSIS — I120 Hypertensive chronic kidney disease with stage 5 chronic kidney disease or end stage renal disease: Secondary | ICD-10-CM | POA: Diagnosis not present

## 2024-02-26 DIAGNOSIS — Z435 Encounter for attention to cystostomy: Secondary | ICD-10-CM | POA: Diagnosis not present

## 2024-02-26 DIAGNOSIS — E785 Hyperlipidemia, unspecified: Secondary | ICD-10-CM | POA: Diagnosis not present

## 2024-02-26 DIAGNOSIS — Z556 Problems related to health literacy: Secondary | ICD-10-CM | POA: Diagnosis not present

## 2024-02-26 DIAGNOSIS — E1122 Type 2 diabetes mellitus with diabetic chronic kidney disease: Secondary | ICD-10-CM | POA: Diagnosis not present

## 2024-02-26 DIAGNOSIS — R339 Retention of urine, unspecified: Secondary | ICD-10-CM | POA: Diagnosis not present

## 2024-02-26 DIAGNOSIS — Z89421 Acquired absence of other right toe(s): Secondary | ICD-10-CM | POA: Diagnosis not present

## 2024-02-26 DIAGNOSIS — Z992 Dependence on renal dialysis: Secondary | ICD-10-CM | POA: Diagnosis not present

## 2024-02-26 DIAGNOSIS — Z7982 Long term (current) use of aspirin: Secondary | ICD-10-CM | POA: Diagnosis not present

## 2024-02-26 DIAGNOSIS — Z8673 Personal history of transient ischemic attack (TIA), and cerebral infarction without residual deficits: Secondary | ICD-10-CM | POA: Diagnosis not present

## 2024-02-26 DIAGNOSIS — B957 Other staphylococcus as the cause of diseases classified elsewhere: Secondary | ICD-10-CM | POA: Diagnosis not present

## 2024-02-26 DIAGNOSIS — N186 End stage renal disease: Secondary | ICD-10-CM | POA: Diagnosis not present

## 2024-02-26 DIAGNOSIS — E1169 Type 2 diabetes mellitus with other specified complication: Secondary | ICD-10-CM | POA: Diagnosis not present

## 2024-02-26 DIAGNOSIS — G2581 Restless legs syndrome: Secondary | ICD-10-CM | POA: Diagnosis not present

## 2024-02-26 DIAGNOSIS — Z604 Social exclusion and rejection: Secondary | ICD-10-CM | POA: Diagnosis not present

## 2024-02-26 DIAGNOSIS — M86171 Other acute osteomyelitis, right ankle and foot: Secondary | ICD-10-CM | POA: Diagnosis not present

## 2024-02-26 DIAGNOSIS — N139 Obstructive and reflux uropathy, unspecified: Secondary | ICD-10-CM | POA: Diagnosis not present

## 2024-02-26 DIAGNOSIS — D631 Anemia in chronic kidney disease: Secondary | ICD-10-CM | POA: Diagnosis not present

## 2024-02-26 DIAGNOSIS — E1151 Type 2 diabetes mellitus with diabetic peripheral angiopathy without gangrene: Secondary | ICD-10-CM | POA: Diagnosis not present

## 2024-02-26 DIAGNOSIS — E1142 Type 2 diabetes mellitus with diabetic polyneuropathy: Secondary | ICD-10-CM | POA: Diagnosis not present

## 2024-02-26 DIAGNOSIS — D509 Iron deficiency anemia, unspecified: Secondary | ICD-10-CM | POA: Diagnosis not present

## 2024-03-02 DIAGNOSIS — Z7902 Long term (current) use of antithrombotics/antiplatelets: Secondary | ICD-10-CM | POA: Diagnosis not present

## 2024-03-02 DIAGNOSIS — B957 Other staphylococcus as the cause of diseases classified elsewhere: Secondary | ICD-10-CM | POA: Diagnosis not present

## 2024-03-02 DIAGNOSIS — D509 Iron deficiency anemia, unspecified: Secondary | ICD-10-CM | POA: Diagnosis not present

## 2024-03-02 DIAGNOSIS — N186 End stage renal disease: Secondary | ICD-10-CM | POA: Diagnosis not present

## 2024-03-02 DIAGNOSIS — Z8673 Personal history of transient ischemic attack (TIA), and cerebral infarction without residual deficits: Secondary | ICD-10-CM | POA: Diagnosis not present

## 2024-03-02 DIAGNOSIS — N139 Obstructive and reflux uropathy, unspecified: Secondary | ICD-10-CM | POA: Diagnosis not present

## 2024-03-02 DIAGNOSIS — E1151 Type 2 diabetes mellitus with diabetic peripheral angiopathy without gangrene: Secondary | ICD-10-CM | POA: Diagnosis not present

## 2024-03-02 DIAGNOSIS — Z604 Social exclusion and rejection: Secondary | ICD-10-CM | POA: Diagnosis not present

## 2024-03-02 DIAGNOSIS — M86171 Other acute osteomyelitis, right ankle and foot: Secondary | ICD-10-CM | POA: Diagnosis not present

## 2024-03-02 DIAGNOSIS — E1142 Type 2 diabetes mellitus with diabetic polyneuropathy: Secondary | ICD-10-CM | POA: Diagnosis not present

## 2024-03-02 DIAGNOSIS — I120 Hypertensive chronic kidney disease with stage 5 chronic kidney disease or end stage renal disease: Secondary | ICD-10-CM | POA: Diagnosis not present

## 2024-03-02 DIAGNOSIS — E1122 Type 2 diabetes mellitus with diabetic chronic kidney disease: Secondary | ICD-10-CM | POA: Diagnosis not present

## 2024-03-02 DIAGNOSIS — Z992 Dependence on renal dialysis: Secondary | ICD-10-CM | POA: Diagnosis not present

## 2024-03-02 DIAGNOSIS — E785 Hyperlipidemia, unspecified: Secondary | ICD-10-CM | POA: Diagnosis not present

## 2024-03-02 DIAGNOSIS — Z89421 Acquired absence of other right toe(s): Secondary | ICD-10-CM | POA: Diagnosis not present

## 2024-03-02 DIAGNOSIS — Z435 Encounter for attention to cystostomy: Secondary | ICD-10-CM | POA: Diagnosis not present

## 2024-03-02 DIAGNOSIS — Z556 Problems related to health literacy: Secondary | ICD-10-CM | POA: Diagnosis not present

## 2024-03-02 DIAGNOSIS — G2581 Restless legs syndrome: Secondary | ICD-10-CM | POA: Diagnosis not present

## 2024-03-02 DIAGNOSIS — Z4781 Encounter for orthopedic aftercare following surgical amputation: Secondary | ICD-10-CM | POA: Diagnosis not present

## 2024-03-02 DIAGNOSIS — Z7982 Long term (current) use of aspirin: Secondary | ICD-10-CM | POA: Diagnosis not present

## 2024-03-02 DIAGNOSIS — D631 Anemia in chronic kidney disease: Secondary | ICD-10-CM | POA: Diagnosis not present

## 2024-03-02 DIAGNOSIS — E1169 Type 2 diabetes mellitus with other specified complication: Secondary | ICD-10-CM | POA: Diagnosis not present

## 2024-03-02 DIAGNOSIS — R339 Retention of urine, unspecified: Secondary | ICD-10-CM | POA: Diagnosis not present

## 2024-03-03 NOTE — Progress Notes (Signed)
 Remote Loop Recorder Transmission

## 2024-03-10 ENCOUNTER — Encounter (HOSPITAL_COMMUNITY)

## 2024-03-10 DIAGNOSIS — I639 Cerebral infarction, unspecified: Secondary | ICD-10-CM

## 2024-03-11 ENCOUNTER — Encounter

## 2024-03-11 LAB — CUP PACEART REMOTE DEVICE CHECK
Date Time Interrogation Session: 20251014230831
Implantable Pulse Generator Implant Date: 20220429

## 2024-03-12 NOTE — Progress Notes (Signed)
 Remote Loop Recorder Transmission

## 2024-03-15 ENCOUNTER — Ambulatory Visit: Payer: Self-pay | Admitting: Cardiology

## 2024-03-18 DIAGNOSIS — D509 Iron deficiency anemia, unspecified: Secondary | ICD-10-CM | POA: Diagnosis not present

## 2024-03-23 DIAGNOSIS — Z7982 Long term (current) use of aspirin: Secondary | ICD-10-CM | POA: Diagnosis not present

## 2024-03-26 DIAGNOSIS — I120 Hypertensive chronic kidney disease with stage 5 chronic kidney disease or end stage renal disease: Secondary | ICD-10-CM | POA: Diagnosis not present

## 2024-03-26 DIAGNOSIS — Z992 Dependence on renal dialysis: Secondary | ICD-10-CM | POA: Diagnosis not present

## 2024-03-26 DIAGNOSIS — N186 End stage renal disease: Secondary | ICD-10-CM | POA: Diagnosis not present

## 2024-04-06 DIAGNOSIS — N319 Neuromuscular dysfunction of bladder, unspecified: Secondary | ICD-10-CM | POA: Diagnosis not present

## 2024-04-06 DIAGNOSIS — Z466 Encounter for fitting and adjustment of urinary device: Secondary | ICD-10-CM | POA: Diagnosis not present

## 2024-04-10 ENCOUNTER — Encounter

## 2024-04-11 ENCOUNTER — Encounter (HOSPITAL_COMMUNITY)

## 2024-04-11 DIAGNOSIS — I639 Cerebral infarction, unspecified: Secondary | ICD-10-CM | POA: Diagnosis not present

## 2024-04-12 ENCOUNTER — Encounter

## 2024-04-12 LAB — CUP PACEART REMOTE DEVICE CHECK
Date Time Interrogation Session: 20251115230638
Implantable Pulse Generator Implant Date: 20220429

## 2024-04-13 ENCOUNTER — Ambulatory Visit: Payer: Self-pay | Admitting: Cardiology

## 2024-04-13 NOTE — Progress Notes (Signed)
 Remote Loop Recorder Transmission

## 2024-04-25 DIAGNOSIS — N186 End stage renal disease: Secondary | ICD-10-CM | POA: Diagnosis not present

## 2024-04-25 DIAGNOSIS — Z992 Dependence on renal dialysis: Secondary | ICD-10-CM | POA: Diagnosis not present

## 2024-04-25 DIAGNOSIS — I120 Hypertensive chronic kidney disease with stage 5 chronic kidney disease or end stage renal disease: Secondary | ICD-10-CM | POA: Diagnosis not present

## 2024-05-06 DIAGNOSIS — Z466 Encounter for fitting and adjustment of urinary device: Secondary | ICD-10-CM | POA: Diagnosis not present

## 2024-05-06 DIAGNOSIS — N319 Neuromuscular dysfunction of bladder, unspecified: Secondary | ICD-10-CM | POA: Diagnosis not present

## 2024-05-06 NOTE — Progress Notes (Signed)
 Douglas Briggs                                          MRN: [id number]   05/06/2024   The [facility] Quality Team Specialist reviewed this patient medical record for the purposes of chart review for care gap closure. The following were reviewed: chart review for care gap closure-glycemic status assessment.  Most recent A1C on 12/04/2023, already abstracted to [employer].    [facility] Quality Team

## 2024-05-11 ENCOUNTER — Encounter

## 2024-05-11 DIAGNOSIS — I739 Peripheral vascular disease, unspecified: Secondary | ICD-10-CM | POA: Diagnosis not present

## 2024-05-11 DIAGNOSIS — Z8673 Personal history of transient ischemic attack (TIA), and cerebral infarction without residual deficits: Secondary | ICD-10-CM | POA: Diagnosis not present

## 2024-05-11 DIAGNOSIS — I10 Essential (primary) hypertension: Secondary | ICD-10-CM | POA: Diagnosis not present

## 2024-05-11 DIAGNOSIS — Z6821 Body mass index (BMI) 21.0-21.9, adult: Secondary | ICD-10-CM | POA: Diagnosis not present

## 2024-05-11 DIAGNOSIS — E785 Hyperlipidemia, unspecified: Secondary | ICD-10-CM | POA: Diagnosis not present

## 2024-05-11 DIAGNOSIS — E1140 Type 2 diabetes mellitus with diabetic neuropathy, unspecified: Secondary | ICD-10-CM | POA: Diagnosis not present

## 2024-05-12 ENCOUNTER — Encounter (HOSPITAL_COMMUNITY)

## 2024-05-12 DIAGNOSIS — I639 Cerebral infarction, unspecified: Secondary | ICD-10-CM | POA: Diagnosis not present

## 2024-05-13 ENCOUNTER — Ambulatory Visit: Payer: Self-pay | Admitting: Cardiology

## 2024-05-13 ENCOUNTER — Encounter

## 2024-05-13 LAB — CUP PACEART REMOTE DEVICE CHECK
Date Time Interrogation Session: 20251216231343
Implantable Pulse Generator Implant Date: 20220429

## 2024-05-13 NOTE — Progress Notes (Signed)
 Remote Loop Recorder Transmission

## 2024-05-18 NOTE — Progress Notes (Signed)
 Douglas Briggs                                          MRN: [id number]   05/18/2024   The [facility] Quality Team Specialist reviewed this patient medical record for the purposes of chart review for care gap closure. The following were reviewed: abstraction for care gap closure-glycemic status assessment.    [facility]BCI Quality Team

## 2024-05-18 NOTE — Progress Notes (Signed)
 Douglas Briggs                                          MRN: [id number]   05/18/2024   The [facility]BCI Quality Team Specialist reviewed this patient medical record for the purposes of chart review for care gap closure. The following were reviewed: erroneous encounter.    [facility] Quality Team

## 2024-05-18 NOTE — Progress Notes (Signed)
 Douglas Briggs                                          MRN: [id number]   05/18/2024   The [facility]BCI Quality Team Specialist reviewed this patient medical record for the purposes of chart review for care gap closure. The following were reviewed: chart review for care gap closure-diabetic eye exam.  Noncompliant 2023    [facility]BCI Quality Team

## 2024-06-11 ENCOUNTER — Encounter

## 2024-06-12 ENCOUNTER — Encounter (HOSPITAL_COMMUNITY)

## 2024-06-12 DIAGNOSIS — I639 Cerebral infarction, unspecified: Secondary | ICD-10-CM

## 2024-06-14 ENCOUNTER — Encounter

## 2024-06-15 ENCOUNTER — Ambulatory Visit: Payer: Self-pay | Admitting: Cardiology

## 2024-06-15 LAB — CUP PACEART REMOTE DEVICE CHECK
Date Time Interrogation Session: 20260116230827
Implantable Pulse Generator Implant Date: 20220429

## 2024-06-17 NOTE — Progress Notes (Signed)
 Remote Loop Recorder Transmission

## 2024-07-12 ENCOUNTER — Encounter

## 2024-07-13 ENCOUNTER — Encounter (HOSPITAL_COMMUNITY)

## 2024-07-15 ENCOUNTER — Encounter

## 2024-08-12 ENCOUNTER — Encounter

## 2024-08-13 ENCOUNTER — Encounter (HOSPITAL_COMMUNITY)

## 2024-08-16 ENCOUNTER — Encounter

## 2024-09-12 ENCOUNTER — Encounter

## 2024-09-13 ENCOUNTER — Encounter (HOSPITAL_COMMUNITY)

## 2024-09-16 ENCOUNTER — Encounter
